# Patient Record
Sex: Male | Born: 1935 | Race: White | Hispanic: No | Marital: Married | Smoking: Never smoker
Health system: Southern US, Community
[De-identification: ages and names within clinical notes are randomized; demographics above are authoritative.]

## PROBLEM LIST (undated history)

## (undated) DIAGNOSIS — Z8719 Personal history of other diseases of the digestive system: Secondary | ICD-10-CM

## (undated) DIAGNOSIS — Z86006 Personal history of melanoma in-situ: Secondary | ICD-10-CM

## (undated) DIAGNOSIS — I10 Essential (primary) hypertension: Secondary | ICD-10-CM

## (undated) DIAGNOSIS — Z8711 Personal history of peptic ulcer disease: Secondary | ICD-10-CM

## (undated) DIAGNOSIS — R351 Nocturia: Secondary | ICD-10-CM

## (undated) DIAGNOSIS — N400 Enlarged prostate without lower urinary tract symptoms: Secondary | ICD-10-CM

## (undated) DIAGNOSIS — Z87442 Personal history of urinary calculi: Secondary | ICD-10-CM

## (undated) DIAGNOSIS — I2510 Atherosclerotic heart disease of native coronary artery without angina pectoris: Secondary | ICD-10-CM

## (undated) DIAGNOSIS — N281 Cyst of kidney, acquired: Secondary | ICD-10-CM

## (undated) DIAGNOSIS — M1990 Unspecified osteoarthritis, unspecified site: Secondary | ICD-10-CM

## (undated) DIAGNOSIS — Z974 Presence of external hearing-aid: Secondary | ICD-10-CM

## (undated) DIAGNOSIS — K219 Gastro-esophageal reflux disease without esophagitis: Secondary | ICD-10-CM

## (undated) DIAGNOSIS — I252 Old myocardial infarction: Secondary | ICD-10-CM

## (undated) DIAGNOSIS — E119 Type 2 diabetes mellitus without complications: Secondary | ICD-10-CM

## (undated) DIAGNOSIS — R319 Hematuria, unspecified: Secondary | ICD-10-CM

## (undated) DIAGNOSIS — Z973 Presence of spectacles and contact lenses: Secondary | ICD-10-CM

## (undated) DIAGNOSIS — Z955 Presence of coronary angioplasty implant and graft: Secondary | ICD-10-CM

## (undated) DIAGNOSIS — C679 Malignant neoplasm of bladder, unspecified: Secondary | ICD-10-CM

## (undated) DIAGNOSIS — E785 Hyperlipidemia, unspecified: Secondary | ICD-10-CM

## (undated) HISTORY — DX: Gastro-esophageal reflux disease without esophagitis: K21.9

## (undated) HISTORY — PX: OTHER SURGICAL HISTORY: SHX169

## (undated) HISTORY — DX: Malignant neoplasm of bladder, unspecified: C67.9

## (undated) HISTORY — DX: Unspecified osteoarthritis, unspecified site: M19.90

## (undated) HISTORY — DX: Essential (primary) hypertension: I10

## (undated) HISTORY — PX: CORONARY ANGIOPLASTY WITH STENT PLACEMENT: SHX49

---

## 1969-06-27 HISTORY — PX: CHOLECYSTECTOMY OPEN: SUR202

## 1993-06-27 HISTORY — PX: INGUINAL HERNIA REPAIR: SUR1180

## 2006-06-27 HISTORY — PX: TOTAL HIP ARTHROPLASTY: SHX124

## 2006-09-26 HISTORY — PX: TRANSURETHRAL RESECTION OF BLADDER TUMOR: SHX2575

## 2010-06-27 HISTORY — PX: ABDOMINAL HERNIA REPAIR: SHX539

## 2016-08-18 DIAGNOSIS — I2510 Atherosclerotic heart disease of native coronary artery without angina pectoris: Secondary | ICD-10-CM | POA: Insufficient documentation

## 2016-08-18 DIAGNOSIS — N401 Enlarged prostate with lower urinary tract symptoms: Secondary | ICD-10-CM | POA: Insufficient documentation

## 2016-08-18 DIAGNOSIS — I10 Essential (primary) hypertension: Secondary | ICD-10-CM

## 2016-08-18 DIAGNOSIS — K219 Gastro-esophageal reflux disease without esophagitis: Secondary | ICD-10-CM | POA: Insufficient documentation

## 2016-08-18 DIAGNOSIS — R3912 Poor urinary stream: Secondary | ICD-10-CM

## 2016-08-18 HISTORY — DX: Essential (primary) hypertension: I10

## 2016-08-23 NOTE — Progress Notes (Signed)
Hematology/Oncology Consult note [facility] [phone] [phone]  No care team member to display   Name of the patient: Tyler Pena  [id number]  [birth year]    Reason for referral- new diagnosis of bladder cancer   Referring physician- Dr. 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 He also had a cystoscopy on 08/01/2016 which showed a bladder mass but no biopsy was done. His other medical problems include hypertension coronary artery disease as well as BPH. He has had prior abdominal surgeries for hernia repair as well as calyceal lysis  CT abdomen on 07/30/2016 showed: Liver and spleen are normal. There has been prior cholecystectomy. Bile duct, pancreas and adrenal glands are normal. There are no renal calculi or hydronephrosis. There are bilateral renal cysts without any evidence of solid renal masses. Renal pelvis and left ureters are normal. There is a 2 mm distal right ureteral calculus adjacent to the trigone there is a right posterior lateral bladder wall mass enhancing approximately 40 Hounsfield units suspicious for primary bladder neoplasm. Prior ventral herniorrhaphy with able concentric adenopathy. This CT was compared to April 2017. In April 2017 patient did not have any abdominal or pelvic lymphadenopathy. Cystoscopy on 08/01/2016 by Dr. Velna Hatchet showed a papillary appearing bladder tumor on the right lower. The right ureteral orifice was not identified. There were no additional bladder tumors identified.   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 He had a right double J stent placement because of the proximity of the mass to the right orifice. Patient has also had  prostate biopsies in 1999 which were negative for malignancy.  Patient has been seen by Dr.Ottelin from urology and underwent a repeat cystoscopy recently which again revealed bladder tumor and the right side of the bladder wall suspicious for transitional cell carcinoma. Patient is scheduled to undergo TURBT on 09/02/2016  ECOG PS- 1  Pain scale- 0   Review of systems- Review of Systems  Constitutional: Negative for chills, fever, malaise/fatigue and weight loss.  HENT: Negative for congestion, ear discharge and nosebleeds.   Eyes: Negative for blurred vision.  Respiratory: Negative for cough, hemoptysis, sputum production, shortness of breath and wheezing.   Cardiovascular: Negative for chest pain, palpitations, orthopnea and claudication.  Gastrointestinal: Negative for abdominal pain, blood in stool, constipation, diarrhea, heartburn, melena, nausea and vomiting.  Genitourinary: Negative for dysuria, flank pain, frequency, hematuria and urgency.  Musculoskeletal: Negative for back pain, joint pain and myalgias.  Skin: Negative for rash.  Neurological: Negative for dizziness, tingling, focal weakness, seizures, weakness and headaches.  Endo/Heme/Allergies: Does not bruise/bleed easily.  Psychiatric/Behavioral: Negative for depression and suicidal ideas. The patient does not have insomnia.     No Known Allergies  Patient Active Problem List   Diagnosis Date Noted  . Benign prostatic hyperplasia with weak urinary stream 08/18/2016  . Coronary artery disease involving native coronary artery of native heart without angina pectoris 08/18/2016  . Essential hypertension 08/18/2016  . GERD without esophagitis 08/18/2016     Past Medical History:  Diagnosis Date  . Arthritis   . Bladder cancer ([place])   . Diabetes mellitus without complication ([place])   . GERD (gastroesophageal reflux disease)   . 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 Skin cancer      Past Surgical  History:  Procedure Laterality Date  . abdominal adhesions lysis    . ABDOMINAL HERNIA REPAIR    . bladder tumor removed    . CHOLECYSTECTOMY    . CORONARY ANGIOPLASTY WITH STENT PLACEMENT    . HERNIA REPAIR    . REPLACEMENT TOTAL HIP W/  RESURFACING IMPLANTS Left     Social History   Social History  . Marital status: Married    Spouse name: N/A  . Number of children: N/A  . Years of education: N/A   Occupational History  . Not on file.   Social History Main Topics  . Smoking status: Not on file  . Smokeless tobacco: Not on file  . Alcohol use Not on file  . Drug use: Unknown  . Sexual activity: Not on file   Other Topics Concern  . Not on file   Social History Narrative  . No narrative on file     No family history on file.  No current outpatient prescriptions on file.   Physical exam:  Vitals:   08/25/16 1528  BP: (!) 153/84  Pulse: 63  Resp: (!) 21  Temp: (!) 96.8 F (36 C)  TempSrc: Oral  Weight: 205 lb 7.5 oz (93.2 kg)  Height: 5' 10.67" (1.795 m)   Physical Exam  Constitutional: He is oriented to person, place, and time and well-developed, well-nourished, and in no distress.  HENT:  Head: Normocephalic and atraumatic.  Eyes: EOM are normal. Pupils are equal, round, and reactive to light.  Neck: Normal range of motion.  Cardiovascular: Normal rate and regular rhythm.   Murmur (Soft systolic murmur positive) heard. Pulmonary/Chest: Effort normal and breath sounds normal.  Abdominal: Soft. Bowel sounds are normal.  Midline scar of prior abdominal surgery seen. Anterior abdominal wall is weakened from prior hernia repair and bowel loops can be palpated  Neurological: He is alert and oriented to person, place, and time.  Skin: Skin is warm and dry.      Assessment and plan- Patient is a 83 y.o. male with a history of leiomyoma of the bladder in 2008 status post resection now presenting with a recurrent bladder mass pending further evaluation  I  discussed the results of patient's recent CT scan which shows evidence of bladder mass as well as stable mesenteric adenopathy. At this time I will await for patient to undergo definitive TURBT on 09/02/2016 and see him after the pathology is back. I explained to him that if the pathology shows no evidence of muscle invasion then this would be superficial bladder cancer which is typically treated with TURBT plus minus intravesical mitomycin or BCG by urology. If there is evidence of muscle invasive disease,  the standard of care in these circumstances is neoadjuvant chemotherapy followed by a radical cystectomy. However patient is 83 years of age but does not have significant comorbidities and is himself not keen to undergo such a big surgery even if that were to be an option. If definitive surgery is not an option for muscle invasive disease then I explained to him that chemoradiation would be the treatment option in that case.    Total face to face encounter time for this patient visit was 30 min. >50% of the time was  spent in counseling and coordination of care.     Thank you for this kind referral and the opportunity to participate  in the care of this patient   Visit Diagnosis 1. Bladder mass     Dr. Randa Evens, MD, MPH [facility] at [facility] Pager- [phone] 08/23/2016 8:30 AM

## 2016-08-25 ENCOUNTER — Inpatient Hospital Stay: Payer: Medicare Other | Attending: Oncology | Admitting: Oncology

## 2016-08-25 ENCOUNTER — Encounter: Payer: Self-pay | Admitting: Oncology

## 2016-08-25 ENCOUNTER — Other Ambulatory Visit: Payer: Self-pay | Admitting: Urology

## 2016-08-25 ENCOUNTER — Encounter (INDEPENDENT_AMBULATORY_CARE_PROVIDER_SITE_OTHER): Payer: Self-pay

## 2016-08-25 VITALS — BP 153/84 | HR 63 | Temp 96.8°F | Resp 21 | Ht 70.67 in | Wt 205.5 lb

## 2016-08-25 DIAGNOSIS — N3289 Other specified disorders of bladder: Secondary | ICD-10-CM | POA: Diagnosis not present

## 2016-08-25 DIAGNOSIS — I2510 Atherosclerotic heart disease of native coronary artery without angina pectoris: Secondary | ICD-10-CM

## 2016-08-25 DIAGNOSIS — R3912 Poor urinary stream: Secondary | ICD-10-CM | POA: Diagnosis not present

## 2016-08-25 DIAGNOSIS — E119 Type 2 diabetes mellitus without complications: Secondary | ICD-10-CM

## 2016-08-25 DIAGNOSIS — R599 Enlarged lymph nodes, unspecified: Secondary | ICD-10-CM

## 2016-08-25 DIAGNOSIS — I10 Essential (primary) hypertension: Secondary | ICD-10-CM | POA: Diagnosis not present

## 2016-08-25 DIAGNOSIS — Z9049 Acquired absence of other specified parts of digestive tract: Secondary | ICD-10-CM | POA: Diagnosis not present

## 2016-08-25 DIAGNOSIS — Z85820 Personal history of malignant melanoma of skin: Secondary | ICD-10-CM

## 2016-08-25 DIAGNOSIS — K219 Gastro-esophageal reflux disease without esophagitis: Secondary | ICD-10-CM | POA: Diagnosis not present

## 2016-08-25 DIAGNOSIS — N401 Enlarged prostate with lower urinary tract symptoms: Secondary | ICD-10-CM

## 2016-08-25 DIAGNOSIS — C679 Malignant neoplasm of bladder, unspecified: Secondary | ICD-10-CM | POA: Insufficient documentation

## 2016-08-25 DIAGNOSIS — N281 Cyst of kidney, acquired: Secondary | ICD-10-CM | POA: Diagnosis not present

## 2016-08-25 NOTE — Progress Notes (Signed)
Patient without complaints at this time.  Patient wants to get the cancer out and get the chemo as necessary and go about life.  Wants to live until at least 89.

## 2016-08-26 ENCOUNTER — Encounter: Payer: Self-pay | Admitting: Oncology

## 2016-08-30 ENCOUNTER — Encounter (HOSPITAL_BASED_OUTPATIENT_CLINIC_OR_DEPARTMENT_OTHER): Payer: Self-pay | Admitting: *Deleted

## 2016-08-30 NOTE — Progress Notes (Signed)
SPOKE W/ PT'S DAUGHTER.  PT [place] PHONE.  NPO AFTER MN.  ARRIVE AT 0930.  NEEDS ISTAT 8 AND EKG.

## 2016-09-02 ENCOUNTER — Ambulatory Visit (HOSPITAL_BASED_OUTPATIENT_CLINIC_OR_DEPARTMENT_OTHER): Payer: Medicare Other | Admitting: Anesthesiology

## 2016-09-02 ENCOUNTER — Ambulatory Visit (HOSPITAL_BASED_OUTPATIENT_CLINIC_OR_DEPARTMENT_OTHER)
Admission: RE | Admit: 2016-09-02 | Discharge: 2016-09-02 | Disposition: A | Discharge status: Home / Self Care | Payer: Medicare Other | Source: Ambulatory Visit | Attending: Urology | Admitting: Urology

## 2016-09-02 ENCOUNTER — Encounter (HOSPITAL_BASED_OUTPATIENT_CLINIC_OR_DEPARTMENT_OTHER)
Admission: RE | Disposition: A | Discharge status: Home / Self Care | Payer: Self-pay | Source: Ambulatory Visit | Attending: Urology

## 2016-09-02 ENCOUNTER — Encounter (HOSPITAL_BASED_OUTPATIENT_CLINIC_OR_DEPARTMENT_OTHER): Payer: Self-pay | Admitting: Anesthesiology

## 2016-09-02 ENCOUNTER — Other Ambulatory Visit: Payer: Self-pay

## 2016-09-02 DIAGNOSIS — K219 Gastro-esophageal reflux disease without esophagitis: Secondary | ICD-10-CM | POA: Insufficient documentation

## 2016-09-02 DIAGNOSIS — I2510 Atherosclerotic heart disease of native coronary artery without angina pectoris: Secondary | ICD-10-CM | POA: Insufficient documentation

## 2016-09-02 DIAGNOSIS — E119 Type 2 diabetes mellitus without complications: Secondary | ICD-10-CM | POA: Insufficient documentation

## 2016-09-02 DIAGNOSIS — I10 Essential (primary) hypertension: Secondary | ICD-10-CM | POA: Diagnosis not present

## 2016-09-02 DIAGNOSIS — Z7982 Long term (current) use of aspirin: Secondary | ICD-10-CM | POA: Diagnosis not present

## 2016-09-02 DIAGNOSIS — M1990 Unspecified osteoarthritis, unspecified site: Secondary | ICD-10-CM | POA: Diagnosis not present

## 2016-09-02 DIAGNOSIS — Z8249 Family history of ischemic heart disease and other diseases of the circulatory system: Secondary | ICD-10-CM | POA: Insufficient documentation

## 2016-09-02 DIAGNOSIS — C679 Malignant neoplasm of bladder, unspecified: Secondary | ICD-10-CM | POA: Diagnosis not present

## 2016-09-02 DIAGNOSIS — D494 Neoplasm of unspecified behavior of bladder: Secondary | ICD-10-CM

## 2016-09-02 HISTORY — DX: Presence of spectacles and contact lenses: Z97.3

## 2016-09-02 HISTORY — DX: Personal history of peptic ulcer disease: Z87.11

## 2016-09-02 HISTORY — DX: Cyst of kidney, acquired: N28.1

## 2016-09-02 HISTORY — DX: Old myocardial infarction: I25.2

## 2016-09-02 HISTORY — DX: Personal history of urinary calculi: Z87.442

## 2016-09-02 HISTORY — DX: Personal history of melanoma in-situ: Z86.006

## 2016-09-02 HISTORY — DX: Hyperlipidemia, unspecified: E78.5

## 2016-09-02 HISTORY — DX: Type 2 diabetes mellitus without complications: E11.9

## 2016-09-02 HISTORY — DX: Presence of coronary angioplasty implant and graft: Z95.5

## 2016-09-02 HISTORY — PX: TRANSURETHRAL RESECTION OF BLADDER TUMOR WITH MITOMYCIN-C: SHX6459

## 2016-09-02 HISTORY — DX: Nocturia: R35.1

## 2016-09-02 HISTORY — DX: Presence of external hearing-aid: Z97.4

## 2016-09-02 HISTORY — DX: Benign prostatic hyperplasia without lower urinary tract symptoms: N40.0

## 2016-09-02 HISTORY — DX: Hematuria, unspecified: R31.9

## 2016-09-02 HISTORY — DX: Atherosclerotic heart disease of native coronary artery without angina pectoris: I25.10

## 2016-09-02 HISTORY — DX: Personal history of other diseases of the digestive system: Z87.19

## 2016-09-02 LAB — POCT I-STAT, CHEM 8
BUN: 17 mg/dL (ref 6–20)
Calcium, Ion: 1.12 mmol/L — ABNORMAL LOW (ref 1.15–1.40)
Chloride: 105 mmol/L (ref 101–111)
Creatinine, Ser: 0.8 mg/dL (ref 0.61–1.24)
Glucose, Bld: 143 mg/dL — ABNORMAL HIGH (ref 65–99)
HCT: 38 % — ABNORMAL LOW (ref 39.0–52.0)
Hemoglobin: 12.9 g/dL — ABNORMAL LOW (ref 13.0–17.0)
Potassium: 3.8 mmol/L (ref 3.5–5.1)
Sodium: 144 mmol/L (ref 135–145)
TCO2: 24 mmol/L (ref 0–100)

## 2016-09-02 SURGERY — TRANSURETHRAL RESECTION OF BLADDER TUMOR WITH MITOMYCIN-C
Anesthesia: General

## 2016-09-02 MED ORDER — OXYCODONE HCL 5 MG PO TABS
5.0000 mg | ORAL_TABLET | Freq: Once | ORAL | Status: DC | PRN
  Filled 2016-09-02: qty 1

## 2016-09-02 MED ORDER — FENTANYL CITRATE (PF) 100 MCG/2ML IJ SOLN
INTRAMUSCULAR | Status: AC
  Filled 2016-09-02: qty 2

## 2016-09-02 MED ORDER — MITOMYCIN CHEMO FOR BLADDER INSTILLATION 40 MG
40.0000 mg | Freq: Once | INTRAVENOUS | Status: AC
  Administered 2016-09-02: 40 mg via INTRAVESICAL
  Filled 2016-09-02 (×2): qty 40

## 2016-09-02 MED ORDER — PHENYLEPHRINE 40 MCG/ML (10ML) SYRINGE FOR IV PUSH (FOR BLOOD PRESSURE SUPPORT)
PREFILLED_SYRINGE | INTRAVENOUS | Status: AC
  Filled 2016-09-02: qty 10

## 2016-09-02 MED ORDER — OXYCODONE HCL 5 MG/5ML PO SOLN
5.0000 mg | Freq: Once | ORAL | Status: DC | PRN
  Filled 2016-09-02: qty 5

## 2016-09-02 MED ORDER — EPHEDRINE SULFATE-NACL 50-0.9 MG/10ML-% IV SOSY
PREFILLED_SYRINGE | INTRAVENOUS | Status: DC | PRN
  Administered 2016-09-02 (×2): 15 mg via INTRAVENOUS

## 2016-09-02 MED ORDER — ONDANSETRON HCL 4 MG/2ML IJ SOLN
4.0000 mg | Freq: Four times a day (QID) | INTRAMUSCULAR | Status: DC | PRN
  Filled 2016-09-02: qty 2

## 2016-09-02 MED ORDER — PROPOFOL 10 MG/ML IV BOLUS
INTRAVENOUS | Status: AC
  Filled 2016-09-02: qty 40

## 2016-09-02 MED ORDER — ONDANSETRON HCL 4 MG/2ML IJ SOLN
INTRAMUSCULAR | Status: DC | PRN
  Administered 2016-09-02: 4 mg via INTRAVENOUS

## 2016-09-02 MED ORDER — LIDOCAINE 2% (20 MG/ML) 5 ML SYRINGE
INTRAMUSCULAR | Status: DC | PRN
  Administered 2016-09-02: 80 mg via INTRAVENOUS

## 2016-09-02 MED ORDER — FENTANYL CITRATE (PF) 100 MCG/2ML IJ SOLN
INTRAMUSCULAR | Status: DC | PRN
  Administered 2016-09-02 (×3): 25 ug via INTRAVENOUS

## 2016-09-02 MED ORDER — HYDROCODONE-ACETAMINOPHEN 10-325 MG PO TABS: 1.0000 | ORAL_TABLET | ORAL | 0 refills | Status: DC | PRN

## 2016-09-02 MED ORDER — DEXAMETHASONE SODIUM PHOSPHATE 10 MG/ML IJ SOLN
INTRAMUSCULAR | Status: AC
  Filled 2016-09-02: qty 1

## 2016-09-02 MED ORDER — ONDANSETRON HCL 4 MG/2ML IJ SOLN
INTRAMUSCULAR | Status: AC
  Filled 2016-09-02: qty 2

## 2016-09-02 MED ORDER — PROPOFOL 10 MG/ML IV BOLUS
INTRAVENOUS | Status: DC | PRN
  Administered 2016-09-02: 30 mg via INTRAVENOUS
  Administered 2016-09-02: 150 mg via INTRAVENOUS

## 2016-09-02 MED ORDER — GLYCOPYRROLATE 0.2 MG/ML IJ SOLN
INTRAMUSCULAR | Status: DC | PRN
  Administered 2016-09-02 (×2): 0.2 mg via INTRAVENOUS

## 2016-09-02 MED ORDER — FLUORESCEIN SODIUM 10 % IV SOLN
INTRAVENOUS | Status: DC | PRN
  Administered 2016-09-02: 200 mg via INTRAVENOUS

## 2016-09-02 MED ORDER — FENTANYL CITRATE (PF) 100 MCG/2ML IJ SOLN
25.0000 ug | INTRAMUSCULAR | Status: DC | PRN
  Administered 2016-09-02: 50 ug via INTRAVENOUS
  Filled 2016-09-02: qty 1

## 2016-09-02 MED ORDER — CIPROFLOXACIN IN D5W 400 MG/200ML IV SOLN
400.0000 mg | INTRAVENOUS | Status: AC
  Administered 2016-09-02: 400 mg via INTRAVENOUS
  Filled 2016-09-02: qty 200

## 2016-09-02 MED ORDER — CIPROFLOXACIN IN D5W 400 MG/200ML IV SOLN
INTRAVENOUS | Status: AC
  Filled 2016-09-02: qty 200

## 2016-09-02 MED ORDER — LIDOCAINE 2% (20 MG/ML) 5 ML SYRINGE
INTRAMUSCULAR | Status: AC
  Filled 2016-09-02: qty 5

## 2016-09-02 MED ORDER — PHENAZOPYRIDINE HCL 200 MG PO TABS: 200.0000 mg | ORAL_TABLET | Freq: Three times a day (TID) | ORAL | 0 refills | Status: DC | PRN

## 2016-09-02 MED ORDER — LACTATED RINGERS IV SOLN
INTRAVENOUS | Status: DC
  Administered 2016-09-02 (×3): via INTRAVENOUS
  Filled 2016-09-02: qty 1000

## 2016-09-02 MED ORDER — SODIUM CHLORIDE 0.9 % IR SOLN
Status: DC | PRN
  Administered 2016-09-02 (×4): 3000 mL

## 2016-09-02 MED ORDER — EPHEDRINE 5 MG/ML INJ
INTRAVENOUS | Status: AC
  Filled 2016-09-02: qty 10

## 2016-09-02 MED FILL — PHENAZOPYRIDINE 200 MG TAB: 200 | 6 days supply | Qty: 16 | Fill #0

## 2016-09-02 MED FILL — HYDROCODON-APAP 10-325: 10-325 | 3 days supply | Qty: 20 | Fill #0

## 2016-09-02 SURGICAL SUPPLY — 33 items
BAG DRAIN URO-CYSTO SKYTR STRL (DRAIN) ×2 IMPLANT
BAG URINE DRAINAGE (UROLOGICAL SUPPLIES) ×2 IMPLANT
BAG URINE LEG 19OZ MD ST LTX (BAG) IMPLANT
CATH FOLEY 2WAY SLVR  5CC 20FR (CATHETERS) ×1
CATH FOLEY 2WAY SLVR  5CC 22FR (CATHETERS)
CATH FOLEY 2WAY SLVR  5CC 24FR (CATHETERS) ×1
CATH FOLEY 2WAY SLVR 5CC 20FR (CATHETERS) ×1 IMPLANT
CATH FOLEY 2WAY SLVR 5CC 22FR (CATHETERS) IMPLANT
CATH FOLEY 2WAY SLVR 5CC 24FR (CATHETERS) ×1 IMPLANT
CATH FOLEY 3WAY 20FR (CATHETERS) IMPLANT
CLOTH BEACON ORANGE TIMEOUT ST (SAFETY) ×2 IMPLANT
ELECT BIVAP BIPO 22/24 DONUT (ELECTROSURGICAL) ×2
ELECT LOOP MED HF 24F 12D (CUTTING LOOP) IMPLANT
ELECT REM PT RETURN 9FT ADLT (ELECTROSURGICAL) ×2
ELECTRD BIVAP BIPO 22/24 DONUT (ELECTROSURGICAL) ×1 IMPLANT
ELECTRODE REM PT RTRN 9FT ADLT (ELECTROSURGICAL) ×1 IMPLANT
EVACUATOR MICROVAS BLADDER (UROLOGICAL SUPPLIES) ×2 IMPLANT
GLOVE BIO SURGEON STRL SZ7 (GLOVE) ×2 IMPLANT
GLOVE BIO SURGEON STRL SZ8 (GLOVE) ×2 IMPLANT
GLOVE BIOGEL PI IND STRL 7.0 (GLOVE) ×1 IMPLANT
GLOVE BIOGEL PI INDICATOR 7.0 (GLOVE) ×1
GOWN STRL REUS W/ TWL LRG LVL3 (GOWN DISPOSABLE) ×1 IMPLANT
GOWN STRL REUS W/ TWL XL LVL3 (GOWN DISPOSABLE) ×1 IMPLANT
GOWN STRL REUS W/TWL LRG LVL3 (GOWN DISPOSABLE) ×1
GOWN STRL REUS W/TWL XL LVL3 (GOWN DISPOSABLE) ×1
HOLDER FOLEY CATH W/STRAP (MISCELLANEOUS) ×2 IMPLANT
IV NS IRRIG 3000ML ARTHROMATIC (IV SOLUTION) ×6 IMPLANT
KIT RM TURNOVER CYSTO AR (KITS) ×2 IMPLANT
MANIFOLD NEPTUNE II (INSTRUMENTS) IMPLANT
PACK CYSTO (CUSTOM PROCEDURE TRAY) ×2 IMPLANT
PLUG CATH AND CAP STER (CATHETERS) IMPLANT
TUBE CONNECTING 12X1/4 (SUCTIONS) IMPLANT
WATER STERILE IRR 3000ML UROMA (IV SOLUTION) ×8 IMPLANT

## 2016-09-02 NOTE — Anesthesia Postprocedure Evaluation (Signed)
Anesthesia Post Note  Patient: Tyler Pena  Procedure(s) Performed: Procedure(s) (LRB): TRANSURETHRAL RESECTION OF BLADDER TUMOR WITH MITOMYCIN-C (N/A)  Patient location during evaluation: PACU Anesthesia Type: General Level of consciousness: awake and alert and patient cooperative Pain management: pain level controlled Vital Signs Assessment: post-procedure vital signs reviewed and stable Respiratory status: spontaneous breathing and respiratory function stable Cardiovascular status: stable Anesthetic complications: no       Last Vitals:  Vitals:   09/02/16 1230 09/02/16 1245  BP: 128/60 (!) 141/64  Pulse: 91 85  Resp: 18 16  Temp:      Last Pain:  Vitals:   09/02/16 1245  TempSrc:   PainSc: [place]

## 2016-09-02 NOTE — Discharge Instructions (Signed)
Indwelling Urinary Catheter Care, Adult Taking good care of your catheter will keep it working properly and help prevent problems from developing. How to wear your catheter Attach your catheter to your leg with adhesive tape or a leg strap. Make sure there is no tension on the catheter. If you use adhesive tape, first remove any sticky residue from the previous tape you used. How to wear a drainage bag  You should have received a large overnight drainage bag and a smaller leg bag that fits underneath clothing. You may wear the overnight bag at any time, but you should never wear the smaller leg bag at night.  Always keep the overnight drainage bag below the level of your bladder, but keep it off the floor. When you sleep, hang the bag inside a wastebasket that is covered by a clean plastic bag.  Always wear the leg bag below your knee. Keep the leg bag secure with a leg strap or adhesive tape. How to care for your skin  Clean the skin around the catheter at least once every day.  Shower every day. Do not take baths.  Apply creams, lotions, or ointments to your genital area only as told by your health care provider.  Do not use powders, sprays, or lotions on your genital area. How to clean your catheter and your skin 1. Wash your hands with soap and water. 2. Wet a washcloth in warm water and mild soap. 3. Use the washcloth to clean the skin where the catheter enters your body. Clean downward, wiping away from the catheter in small circles. Do not wipe toward the catheter. This can push bacteria into the urethra and cause infection. 4. Pat the area dry with a clean towel. Make sure to remove all soap. How to care for your drainage bags Empty your drainage bag when it is ?- full, or at least 2-3 times a day. Replace your drainage bag once a month or sooner if it starts to smell bad or look dirty. Do not clean your drainage bag unless told by your health care provider. Emptying a drainage  bag   Supplies Needed  Rubbing alcohol.  Gauze pad or cotton ball.  Adhesive tape or a leg strap. Steps 1. Wash your hands with soap and water. 2. Detach the drainage bag from your leg. 3. Hold the drainage bag over the toilet or a clean container. Keep the drainage bag below your hips and bladder. This stops urine from going back into the tubing and into your bladder. 4. Open the pour spout at the bottom of the bag. 5. Empty the urine into the toilet or container. Do not let the pour spout touch any surface. This helps keep bacteria out of the bag and helps prevent infection. 6. Apply rubbing alcohol to a gauze pad or cotton ball. 7. Use the gauze pad or cotton ball to clean the pour spout. 8. Close the pour spout. 9. Attach the bag to your leg with adhesive tape or a leg strap. 10. Wash your hands. Changing a drainage bag  Supplies Needed  Alcohol wipes.  A clean drainage bag.  Adhesive tape or a leg strap. Steps 1. Wash your hands with soap and water. 2. Detach the dirty drainage bag from your leg. 3. Pinch the rubber catheter with your fingers so that urine does not spill out. 4. Disconnect the catheter tube from the drainage tube at the connection valve. Do not let the tubes touch any surface. 5. Clean  the end of the catheter tube with an alcohol wipe. Use a different alcohol wipe to clean the end of the drainage tube. 6. Connect the catheter tube to the drainage tube of the clean drainage bag. 7. Attach the new bag to your leg with adhesive tape or a leg strap. Avoid attaching the new bag too tightly. 8. Wash your hands. How to prevent infection and other problems  Never pull on your catheter or try to remove it. Pulling can damage your internal tissues.  Always wash your hands before and after handling your catheter.  If a leg strap gets wet, replace it with a dry one.  Drink enough fluids to keep your urine clear or pale yellow, or as told by your health care  provider.  Do not let the drainage bag or tubing touch the floor.  Wear cotton underwear. Cotton absorbs moisture and keeps your skin dry.  If you are male, wipe from front to back after each bowel movement.  Check the catheter often to make sure that it works properly and the tubing is not twisted or curled. Contact a health care provider if:  Your urine is cloudy.  Your urine smells unusually bad.  Your urine is not draining into the bag.  Your catheter gets clogged.  Your catheter starts to leak.  Your bladder feels full. Get help right away if:  You have redness, swelling, or pain where the catheter enters your body.  You have fluid, pus, or a bad smell coming from the area where the catheter enters your body.  The area where the catheter enters your body feels warm to the touch.  You have a fever.  You have pain in your abdomen, legs, lower back, or bladder.  You see blood fill the catheter.  Your urine is pink or red.  You have nausea, vomiting, or chills.  Your catheter gets pulled out. This information is not intended to replace advice given to you by your health care provider. Make sure you discuss any questions you have with your health care provider. Document Released: 06/13/2005 Document Revised: 05/11/2016 Document Reviewed: 11/26/2013 Elsevier Interactive Patient Education  2017 [place] Anesthesia Home Care Instructions  Activity: Get plenty of rest for the remainder of the day. A responsible adult should stay with you for 24 hours following the procedure.  For the next 24 hours, DO NOT: -Drive a car -Paediatric nurse -Drink alcoholic beverages -Take any medication unless instructed by your physician -Make any legal decisions or sign important papers.  Meals: Start with liquid foods such as gelatin or soup. Progress to regular foods as tolerated. Avoid greasy, spicy, heavy foods. If nausea and/or vomiting occur, drink only clear  liquids until the nausea and/or vomiting subsides. Call your physician if vomiting continues.  Special Instructions/Symptoms: Your throat may feel dry or sore from the anesthesia or the breathing tube placed in your throat during surgery. If this causes discomfort, gargle with warm salt water. The discomfort should disappear within 24 hours.  If you had a scopolamine patch placed behind your ear for the management of post- operative nausea and/or vomiting:  1. The medication in the patch is effective for 72 hours, after which it should be removed.  Wrap patch in a tissue and discard in the trash. Wash hands thoroughly with soap and water. 2. You may remove the patch earlier than 72 hours if you experience unpleasant side effects which may include dry mouth, dizziness or visual disturbances.  3. Avoid touching the patch. Wash your hands with soap and water after contact with the patch.   Transurethral Resection of Bladder Tumor (TURBT)   Definition:  Transurethral Resection of the Bladder Tumor is a surgical procedure used to diagnose and remove tumors within the bladder. TURBT is the most common treatment for early stage bladder cancer.  General instructions:     Your recent bladder surgery requires very little post hospital care but some definite precautions.  Despite the fact that no skin incisions were used, the area around the bladder incisions are raw and covered with scabs to promote healing and prevent bleeding. Certain precautions are needed to insure that the scabs are not disturbed over the next 2-4 weeks while the healing proceeds.  Because the raw surface inside your bladder and the irritating effects of urine you may expect frequency of urination and/or urgency (a stronger desire to urinate) and perhaps even getting up at night more often. This will usually resolve or improve slowly over the healing period. You may see some blood in your urine over the first 6 weeks. Do not be  alarmed, even if the urine was clear for a while. Get off your feet and drink lots of fluids until clearing occurs. If you start to pass clots or don't improve call us.  Catheter: (If you are discharged with a catheter.)  1. Keep your catheter secured to your leg at all times with tape or the supplied strap. 2. You may experience leakage of urine around your catheter- as long as the  catheter continues to drain, this is normal.  If your catheter stops draining  go to the ER. 3. You may also have blood in your urine, even after it has been clear for  several days; you may even pass some small blood clots or other material.  This  is normal as well.  If this happens, sit down and drink plenty of water to help  make urine to flush out your bladder.  If the blood in your urine becomes worse  after doing this, contact our office or return to the ER. 4. You may use the leg bag (small bag) during the day, but use the large bag at  night.  Diet:  You may return to your normal diet immediately. Because of the raw surface of your bladder, alcohol, spicy foods, foods high in acid and drinks with caffeine may cause irritation or frequency and should be used in moderation. To keep your urine flowing freely and avoid constipation, drink plenty of fluids during the day (8-10 glasses). Tip: Avoid cranberry juice because it is very acidic.  Activity:  Your physical activity doesn't need to be restricted. However, if you are very active, you may see some blood in the urine. We suggest that you reduce your activity under the circumstances until the bleeding has stopped.  Bowels:  It is important to keep your bowels regular during the postoperative period. Straining with bowel movements can cause bleeding. A bowel movement every other day is reasonable. Use a mild laxative if needed, such as milk of magnesia 2-3 tablespoons, or 2 Dulcolax tablets. Call if you continue to have problems. If you had been taking  narcotics for pain, before, during or after your surgery, you may be constipated. Take a laxative if necessary.    Medication:  You should resume your pre-surgery medications unless told not to. In addition you may be given an antibiotic to prevent or treat infection. Antibiotics  are not always necessary. All medication should be taken as prescribed until the bottles are finished unless you are having an unusual reaction to one of the drugs.

## 2016-09-02 NOTE — Op Note (Signed)
PATIENT:  Tyler Pena  PRE-OPERATIVE DIAGNOSIS: Bladder tumor  POST-OPERATIVE DIAGNOSIS: Same  PROCEDURE:  Procedure(s): 1. TRANSURETHRAL RESECTION OF BLADDER TUMOR (TURBT) (3 cm.) 2. Instillation of intravesical chemotherapy (mitomycin-C)  SURGEON:  Surgeon(s): Claybon Jabs  ANESTHESIA:   General  EBL:  Minimal  DRAINS: Urethral catheter (20 Fr. Foley)   SPECIMEN:  Bladder tumor  DISPOSITION OF SPECIMEN:  PATHOLOGY  Indication:  Mr. Abarca is a 83 year old male who was recently found to have what was described as a 3 mm bladder tumor after evaluation for hematuria was undertaken. When I saw him in my office and performed cystoscopy I noted a much larger tumor involving the floor of the bladder, right hemitrigone, right wall of the bladder, posterior wall a bladder and advancing toward the bladder neck but not involving the bladder neck. We discussed the need for resection and use of postoperative mitomycin-C and he has elected to proceed.  Description of operation: The patient was taken to the operating room and administered general anesthesia. They were then placed on the table and moved to the dorsal lithotomy position after which the genitalia was sterilely prepped and draped. An official timeout was then performed.  The 46 French resectoscope with the 30 lens and visual obturator were then passed into the bladder under direct visualization. Urethra appeared normal. The visual obturator was then removed and the Gyrus resectoscope element with 30  lens was then inserted and the bladder was fully and systematically inspected. There was 3-4+ trabeculation of the bladder. The left ureteral orifice and noted to be of normal configuration and position. The right ureteral orifice was completely encompassed with bladder tumor. The bladder tumor extended to the locations as noted above. Having involve the right ureteral Fluorescein was given intravenously to aid in identification of the  ureteral orifice.  I first began by resecting the tumor from the posterior wall the bladder and then resected the tumor from the trigone and right wall as well as the floor the bladder out toward the bladder neck. I was able to resect all of the obvious bladder tumor. I fulgurated the floor. I then observed and could see Fluorescein coming from the left UO and was able to identify the right ureteral orifice. In doing this I was able to push the scope against the bladder wall and actually even for some of the distal right ureteral mucosa and could see some small papillary areas in the very distal aspect of the ureteral orifice and therefore I felt that this likely represented tumor that had begun to advance up the right ureter. What I elected to do was resect the distal right ureter and did so and sent this separately as right ureteral orifice. I continued to note good effluxing did not fulgurate in the area of the distal right ureter. There was no bleeding at the end of the operation as all bleeding points had been cauterized with electrocautery.  Reinspection of the bladder revealed all obvious tumor had been fully resected and there was no evidence of perforation. The Microvasive evacuator was then used to irrigate the bladder and remove all of the portions of bladder tumor which were sent to pathology. I then removed the resectoscope.  A 20 French Foley catheter was then inserted in the bladder and irrigated. The irrigant returned slightly pink with no clots. The patient was awakened and taken to the recovery room.  While in the recovery room 40 mg of mitomycin-C in 40 cc of water were instilled  in the bladder through the catheter and the catheter was plugged. This will remain indwelling for approximately one hour. It will then be drained from the bladder and the catheter will be removed and the patient discharged home.  PLAN OF CARE: Discharge to home after PACU  PATIENT DISPOSITION:  PACU -  hemodynamically stable.

## 2016-09-02 NOTE — Progress Notes (Signed)
Only one bag of fluid started not two

## 2016-09-02 NOTE — H&P (Signed)
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 He experienced gross hematuria and underwent a CT scan on 08/01/16 in [place] which revealed a 2 mm distal right ureteral stone as well as a posterior wall mass. This was confirmed by cystoscopy on that same date to be a 3 mm papillary bladder tumor on the floor of the bladder on the right-hand side.   He has a past urologic history of having undergone TRUS/BX in 5/99 which revealed BPH only.  He also underwent transurethral resection of a solid mass that was found on CT scan in 4/08 and was noted to be adjacent to the right ureteral orifice. He underwent resection of this in 5/08 and had a double-J stent placed due to the proximity of the UO. The stent was removed and follow-up imaging revealed no evidence of hydronephrosis.  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  He also has known bilateral renal cyst that appeared simple by CT scan in 4/08.   He has BPH and has been treated with both finasteride and tamsulosin for number of years. He says he gets up once at night and really doesn't have any difficulties during the daytime but will double void with his first urination in the morning.   He reports he had a single episode where he noted his urine was coke or colon colored thinking it was very possibly some old blood in the urine but he never experienced any gross hematuria. He was found to have microscopic hematuria at the time of his routine physical exam.     CC: I have bladder cancer.  HPI: Tyler Pena is a 83 year-old male patient who was referred by Dr. Dion Body, MD who is here for bladder cancer.  The bladder cancer was found because of blood in his urine.   He has not received treatment for bladder cancer.   His last cysto was 08/01/2016.   He does not have a history of bleeding diathesis. His bowels are moving normally. He is not  having new bone pain. He has not recently had unwanted weight loss.   His last IVP or CT Scan was 08/01/2016.     ALLERGIES: None   MEDICATIONS: Aspirin 81 mg tablet, chewable  Finasteride 5 mg tablet  Hydrochlorothiazide 12.5 mg tablet  Metoprolol Succinate 25 mg tablet, extended release 24 hr  Tamsulosin Hcl 0.4 mg capsule, ext release 24 hr  Cinnamon 500 mg capsule  Coq-10  Fishoil  Losartan Potassium 50 mg tablet  Multivitamin  Pantoprazole Sodium 40 mg tablet, delayed release     GU PSH: None   NON-GU PSH: Cardiac Stent Placement Cholecystectomy (open) - 1973 Hernia Repair Hip Arthroscopy/surgery, Left Removal of skin cancer    GU PMH: None   NON-GU PMH: Abdominal aortic aneurysm Acute gastric ulcer with hemorrhage Arthritis Diabetes Type 2 GERD Heart disease, unspecified Hypertension    FAMILY HISTORY: 1 Daughter - Runs in Family 1 son - Runs in Family Gallbladder cancer - Mother Myocardial Infarction - Father   SOCIAL HISTORY: Marital Status: Married Current Smoking Status: Patient has never smoked.   Tobacco Use Assessment Completed: Used Tobacco in last 30 days? Has never drank.  Drinks 3 caffeinated drinks per day.    REVIEW OF SYSTEMS:    GU Review Male:   Patient reports frequent urination, get up at night to urinate, trouble starting your stream, and erection problems. Patient denies  hard to postpone urination, burning/ pain with urination, leakage of urine, stream starts and stops, have to strain to urinate , and penile pain.  Gastrointestinal (Upper):   Patient reports indigestion/ heartburn. Patient denies nausea and vomiting.  Gastrointestinal (Lower):   Patient reports diarrhea. Patient denies constipation.  Constitutional:   Patient denies fever, night sweats, weight loss, and fatigue.  Skin:   Patient denies skin rash/ lesion and itching.  Eyes:   Patient denies blurred vision and double vision.  Ears/ Nose/ Throat:   Patient denies sore  throat and sinus problems.  Hematologic/Lymphatic:   Patient denies swollen glands and easy bruising.  Cardiovascular:   Patient denies leg swelling and chest pains.  Respiratory:   Patient denies cough and shortness of breath.  Endocrine:   Patient denies excessive thirst.  Musculoskeletal:   Patient reports joint pain. Patient denies back pain.  Neurological:   Patient denies headaches and dizziness.  Psychologic:   Patient denies depression and anxiety.   VITAL SIGNS:    Weight 196 lb / 88.9 kg  Height 69 in / 175.26 cm  BP 121/67 mmHg  Pulse 70 /min  BMI 28.9 kg/m   GU PHYSICAL EXAMINATION:    Urethral Meatus: Normal size. No lesion, no wart, no discharge, no polyp. Normal location.  Penis: Circumcised, no warts, no cracks. No dorsal Peyronie's plaques, no left corporal Peyronie's plaques, no right corporal Peyronie's plaques, no scarring, no warts. No balanitis, no meatal stenosis.   MULTI-SYSTEM PHYSICAL EXAMINATION:    Constitutional: Well-nourished. No physical deformities. Normally developed. Good grooming.  Neck: Neck symmetrical, not swollen. Normal tracheal position.  Respiratory: No labored breathing, no use of accessory muscles.   Cardiovascular: Normal temperature, normal extremity pulses, no swelling, no varicosities.  Lymphatic: No enlargement of neck, axillae, groin.  Skin: No paleness, no jaundice, no cyanosis. No lesion, no ulcer, no rash.  Neurologic / Psychiatric: Oriented to time, oriented to place, oriented to person. No depression, no anxiety, no agitation.  Gastrointestinal: No mass, no tenderness, no rigidity, non obese abdomen.  Eyes: Normal conjunctivae. Normal eyelids.  Ears, Nose, Mouth, and Throat: Left ear no scars, no lesions, no masses. Right ear no scars, no lesions, no masses. Nose no scars, no lesions, no masses. Normal hearing. Normal lips.  Musculoskeletal: Normal gait and station of head and neck.     PAST DATA REVIEWED:  Source Of History:   Patient, Outside Source  Lab Test Review:   Path Report  Records Review:   Previous Doctor Records, Previous Hospital Records, POC Tool  X-Ray Review: C.T. Abdomen/Pelvis: Reviewed Report.     PROCEDURES:         Flexible Cystoscopy - 52000  Risks, benefits, and some of the potential complications of the procedure were discussed at length with the patient including infection, bleeding, voiding discomfort, urinary retention, fever, chills, sepsis, and others. All questions were answered. Informed consent was obtained. Sterile technique and 2% Lidocaine intraurethral analgesia were used.  Meatus:  Normal size. Normal location. Normal condition.  Urethra:  No strictures.  External Sphincter:  Normal.  Verumontanum:  Normal.  Prostate:  Obstructing. Severe hyperplasia.  Bladder Neck:  Non-obstructing.  Ureteral Orifices:  Normal location. Normal size. Normal shape. Effluxed clear urine.  Bladder:  His bladder had mild trabeculation. I noted a papillary lesion on the floor of the bladder extending to about the level of the bladder neck at about the 7 o'clock position. It appeared larger than 3 mm as previously  described.      The lower urinary tract was carefully examined. The procedure was well-tolerated and without complications. Instructions were given to call the office immediately for bloody urine, difficulty urinating, urinary retention, painful or frequent urination, fever or other illness. The patient stated that he understood these instructions and would comply with them.         Urinalysis w/Scope Dipstick Dipstick Cont'd Micro  Color: Yellow Bilirubin: Neg WBC/hpf: 10 - 20/hpf  Appearance: Clear Ketones: Neg RBC/hpf: 0 - 2/hpf  Specific Gravity: 1.025 Blood: Neg Bacteria: Mod (26-50/hpf)  pH: 5.5 Protein: Neg Cystals: NS (Not Seen)  Glucose: Neg Urobilinogen: 0.2 Casts: NS (Not Seen)    Nitrites: Positive Trichomonas: Not Present    Leukocyte Esterase: Trace Mucous: Not Present       Epithelial Cells: 0 - 5/hpf      Yeast: NS (Not Seen)      Sperm: Not Present    ASSESSMENT/PLAN:      ICD-10 Details  1 GU:   Bladder Cancer, overlapping sites - C67.8 I went over the results of the CT scan as well as my cystoscopic findings today which have revealed a bladder mass consistent with transitional cell carcinoma. We discussed the fact that currently there is no evidence of extravesical extension or pelvic adenopathy based on CT scan findings. Further characterization of the lesion is required for grading and staging purposes. We discussed proceeding with evaluation using transurethral resection of the lesion. I have discussed the procedure in detail as well as the potential risks and complications associated with this form of surgery. We also discussed the probability of successful resection of the intravesical portion of this lesion. I have recommended, as long as there is no contraindication at the time of surgery, the placement of intravesical mitomycin-C in order to reduce the risk of recurrence. We did discuss the potential side effects of this form of intravesical chemotherapy. The procedure will be performed under anesthesia as an outpatient.   2 NON-GU:   Bacteriuria - R82.71 I noted some white cells and bacteria in the urine with nitrite positivity. His urine was cultured and was negative.

## 2016-09-02 NOTE — Transfer of Care (Signed)
Immediate Anesthesia Transfer of Care Note  Patient: Hiroyuki Kerkman  Procedure(s) Performed: Procedure(s): TRANSURETHRAL RESECTION OF BLADDER TUMOR WITH MITOMYCIN-C (N/A)  Patient Location: PACU  Anesthesia Type:General  Level of Consciousness: awake, alert , oriented and patient cooperative  Airway & Oxygen Therapy: Patient Spontanous Breathing and Patient connected to nasal cannula oxygen  Post-op Assessment: Report given to RN and Post -op Vital signs reviewed and stable  Post vital signs: Reviewed and stable  Last Vitals:  Vitals:   09/02/16 0923  BP: (!) 157/66  Pulse: 61  Resp: 18  Temp: 36.5 C    Last Pain:  Vitals:   09/02/16 0923  TempSrc: Oral      Patients Stated Pain Goal: 7 (60/63/01 6010)  Complications: No apparent anesthesia complications

## 2016-09-02 NOTE — Progress Notes (Signed)
Up to bathroom to void without results. No distention noted on palpation. Denies feeling full or pressure. Dr. Karsten Ro aware. Additional orders given.

## 2016-09-02 NOTE — Progress Notes (Signed)
Inserted 18 Fr. Foley cath without difficulty for 315ml tea colored urine. Tolerated well. Daughter aware to return to [facility] on Monday for voiding trial.

## 2016-09-02 NOTE — Anesthesia Procedure Notes (Signed)
Procedure Name: LMA Insertion Date/Time: 09/02/2016 11:11 AM Performed by: Wanita Chamberlain Pre-anesthesia Checklist: Patient identified, Timeout performed, Emergency Drugs available, Suction available and Patient being monitored Patient Re-evaluated:Patient Re-evaluated prior to inductionOxygen Delivery Method: Circle system utilized Preoxygenation: Pre-oxygenation with 100% oxygen Intubation Type: IV induction Ventilation: Mask ventilation without difficulty LMA: LMA inserted LMA Size: 5.0 Number of attempts: 1 Placement Confirmation: positive ETCO2 and breath sounds checked- equal and bilateral Tube secured with: Tape Dental Injury: Teeth and Oropharynx as per pre-operative assessment

## 2016-09-02 NOTE — Anesthesia Preprocedure Evaluation (Addendum)
Anesthesia Evaluation  Patient identified by MRN, date of birth, ID band Patient awake    Reviewed: Allergy & Precautions, H&P , NPO status , Patient's Chart, lab work & pertinent test results  Airway Mallampati: II   Neck ROM: full    Dental  (+) Implants, Dental Advisory Given, Poor Dentition,    Pulmonary neg pulmonary ROS,    breath sounds clear to auscultation       Cardiovascular hypertension, Pt. on medications + CAD and + Cardiac Stents   Rhythm:regular Rate:Normal     Neuro/Psych    GI/Hepatic hiatal hernia, GERD  Medicated and Controlled,  Endo/Other  diabetes, Well Controlled, Type 2  Renal/GU Bladder tumor   Bladder CA    Musculoskeletal  (+) Arthritis ,   Abdominal   Peds  Hematology   Anesthesia Other Findings   Reproductive/Obstetrics                           Anesthesia Physical Anesthesia Plan  ASA: III  Anesthesia Plan: General   Post-op Pain Management:    Induction: Intravenous  Airway Management Planned: LMA  Additional Equipment:   Intra-op Plan:   Post-operative Plan:   Informed Consent: I have reviewed the patients History and Physical, chart, labs and discussed the procedure including the risks, benefits and alternatives for the proposed anesthesia with the patient or authorized representative who has indicated his/her understanding and acceptance.     Plan Discussed with: CRNA, Anesthesiologist and Surgeon  Anesthesia Plan Comments:         Anesthesia Quick Evaluation

## 2016-09-06 ENCOUNTER — Encounter (HOSPITAL_BASED_OUTPATIENT_CLINIC_OR_DEPARTMENT_OTHER): Payer: Self-pay | Admitting: Urology

## 2016-09-20 ENCOUNTER — Inpatient Hospital Stay (HOSPITAL_BASED_OUTPATIENT_CLINIC_OR_DEPARTMENT_OTHER): Payer: Medicare Other | Admitting: Oncology

## 2016-09-20 VITALS — BP 128/67 | HR 57 | Temp 96.5°F | Resp 16 | Wt 200.1 lb

## 2016-09-20 DIAGNOSIS — Z9049 Acquired absence of other specified parts of digestive tract: Secondary | ICD-10-CM

## 2016-09-20 DIAGNOSIS — R599 Enlarged lymph nodes, unspecified: Secondary | ICD-10-CM | POA: Diagnosis not present

## 2016-09-20 DIAGNOSIS — R3912 Poor urinary stream: Secondary | ICD-10-CM | POA: Diagnosis not present

## 2016-09-20 DIAGNOSIS — C679 Malignant neoplasm of bladder, unspecified: Secondary | ICD-10-CM | POA: Diagnosis not present

## 2016-09-20 DIAGNOSIS — E119 Type 2 diabetes mellitus without complications: Secondary | ICD-10-CM

## 2016-09-20 DIAGNOSIS — Z85820 Personal history of malignant melanoma of skin: Secondary | ICD-10-CM | POA: Diagnosis not present

## 2016-09-20 DIAGNOSIS — N401 Enlarged prostate with lower urinary tract symptoms: Secondary | ICD-10-CM

## 2016-09-20 DIAGNOSIS — K219 Gastro-esophageal reflux disease without esophagitis: Secondary | ICD-10-CM

## 2016-09-20 DIAGNOSIS — I2510 Atherosclerotic heart disease of native coronary artery without angina pectoris: Secondary | ICD-10-CM

## 2016-09-20 DIAGNOSIS — I10 Essential (primary) hypertension: Secondary | ICD-10-CM | POA: Diagnosis not present

## 2016-09-20 DIAGNOSIS — N281 Cyst of kidney, acquired: Secondary | ICD-10-CM

## 2016-09-20 NOTE — Progress Notes (Signed)
Patient here today for follow up.  Patient states no new concerns today  

## 2016-09-20 NOTE — Progress Notes (Signed)
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 He also had a cystoscopy on 08/01/2016 which showed a bladder mass but no biopsy was done. His other medical problems include hypertension coronary artery disease as well as BPH. He has had prior abdominal surgeries for hernia repair as well as calyceal lysis  CT abdomen on 07/30/2016 showed: Liver and spleen are normal. There has been prior cholecystectomy. Bile duct, pancreas and adrenal glands are normal. There are no renal calculi or hydronephrosis. There are bilateral renal cysts without any evidence of solid renal masses. Renal pelvis and left ureters are normal. There is a 2 mm distal right ureteral calculus adjacent to the trigone there is a right posterior lateral bladder wall mass enhancing approximately 40 Hounsfield units suspicious for primary bladder neoplasm. Prior ventral herniorrhaphy with stable concentric adenopathy. This CT was compared to April 2017. In April 2017 patient did not have any abdominal or pelvic lymphadenopathy. Cystoscopy on 08/01/2016 by Dr. Velna Hatchet showed a papillary appearing bladder tumor on the right lower. The right ureteral orifice was not identified. There were no additional bladder tumors identified.   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 He had a right double J stent placement because of the  proximity of the mass to the right orifice. Patient has also had prostate biopsies in 1999 which were negative for malignancy.  Patient has been seen by Dr.Ottelin from urology and underwent a repeat cystoscopy recently which again revealed bladder tumor and the right side of the bladder wall suspicious for transitional cell carcinoma  Patient underwent TURBT on 09/02/2016 which showed a low grade in situ non invasive papillary urothelial carcinoma. Muscularis propria is present and is not involved. Biopsy of the ureters showed benign fibromuscular tissue and small focus of cauterized urothelium Interval history- doing well since his TURBT. Denies any complaints  Review of systems- Review of Systems  Constitutional: Negative for chills, fever, malaise/fatigue and weight loss.  HENT: Negative for congestion, ear discharge and nosebleeds.   Eyes: Negative for blurred vision.  Respiratory: Negative for cough, hemoptysis, sputum production, shortness of breath and wheezing.   Cardiovascular: Negative for chest pain, palpitations, orthopnea and claudication.  Gastrointestinal: Negative for abdominal pain, blood in stool, constipation, diarrhea, heartburn, melena, nausea and vomiting.  Genitourinary: Negative for dysuria, flank pain, frequency, hematuria and urgency.  Musculoskeletal: Negative for back pain, joint pain and myalgias.  Skin: Negative for rash.  Neurological: Negative for dizziness, tingling, focal weakness, seizures, weakness and headaches.  Endo/Heme/Allergies: Does not bruise/bleed easily.  Psychiatric/Behavioral: Negative for depression and suicidal ideas. The patient does not have insomnia.        No Known Allergies   Past Medical History:  Diagnosis Date  . Arthritis   . Bilateral renal cysts   . Bladder cancer [facility])    urologist-  dr Karsten Ro  . BPH (benign prostatic hyperplasia)   . Coronary  artery disease    hx PCI w/ stenting in 10/ 2014 in [place]--- recently  moved from [place] jan 2018 has not established a cardiologist yet but does have pcp   . Dyslipidemia   . Essential hypertension   . GERD (gastroesophageal reflux disease)   . Hematuria   . History of gastric ulcer   . History of hiatal hernia   . History of kidney stones   . History of melanoma in situ    several Excision's in situ and malignant melanoma's--  last one 06/ 2017 top of ear excision Stage 1 w/ negative margins/  other have been right upper arm, shoulder, chest, face  . History of MI (myocardial infarction)    10/ 2014-- s/p  PCI and stenting x2  . History of small bowel obstruction    fall 2017  s/p  abdominal lysis adhesions  (prior hx bowel obstruction total 8 times)  . Nocturia   . S/P right coronary artery (RCA) stent placement    10/ 2014  x2  . Type 2 diabetes, diet controlled ([place])   . Wears glasses   . Wears hearing aid    bilateral     Past Surgical History:  Procedure Laterality Date  . ABDOMINAL HERNIA REPAIR  2012  . ADDOMINAL LYSIS ADHESIONS  fall 2017   sbo  . CHOLECYSTECTOMY OPEN  1971   and Appendectomy  . CORONARY ANGIOPLASTY WITH STENT PLACEMENT  04-17-2013  in [place]   x2 stents to RCA  . INGUINAL HERNIA REPAIR Bilateral 1995  . TOTAL HIP ARTHROPLASTY Left 2008  . TRANSURETHRAL RESECTION OF BLADDER TUMOR  09/2006   LEIOMYOMA   . TRANSURETHRAL RESECTION OF BLADDER TUMOR WITH MITOMYCIN-C N/A 09/02/2016   Procedure: TRANSURETHRAL RESECTION OF BLADDER TUMOR WITH MITOMYCIN-C;  Surgeon: Kathie Rhodes, MD;  Location: [facility];  Service: Urology;  Laterality: N/A;    Social History   Social History  . Marital status: Married    Spouse name: N/A  . Number of children: N/A  . Years of education: N/A   Occupational History  . Not on file.   Social History Main Topics  . Smoking status: Never Smoker  . Smokeless tobacco: Never Used  . Alcohol use Yes     Comment: occasional  . Drug use: No  . Sexual activity: Not on  file   Other Topics Concern  . Not on file   Social History Narrative  . No narrative on file    No family history on file.   Current Outpatient Prescriptions:  .  aspirin EC 81 MG tablet, Take 81 mg by mouth daily., Disp: , Rfl:  .  Black Pepper-Turmeric 3-500 MG CAPS, Take 2 capsules by mouth daily., Disp: , Rfl:  .  Chromium-Cinnamon (CINNAMON PLUS CHROMIUM PO), Take 1 tablet by mouth 2 (two) times daily. 1000mg  cinnamon/  250mg  chromium, Disp: , Rfl:  .  finasteride (PROSCAR) 5 MG tablet, Take 5 mg by mouth every evening. , Disp: , Rfl:  .  hydrochlorothiazide (HYDRODIURIL) 25 MG tablet, Take 12.5 mg by mouth every morning. , Disp: , Rfl:  .  losartan (COZAAR) 50 MG tablet, Take 50 mg by mouth every evening. , Disp: , Rfl:  .  metoprolol tartrate (LOPRESSOR) 25 MG tablet, Take 50 mg by mouth every evening. , Disp: , Rfl:  .  Multiple Vitamin (MULTIVITAMIN WITH MINERALS) TABS tablet, Take 1 tablet by mouth daily., Disp: , Rfl:  .  Omega-3 Fatty Acids (SUPER OMEGA 3 EPA/DHA) 1000 MG CAPS, Take 1 capsule by mouth daily. Omega 3 - DHA - EPA - Fish oil-- 1000mg / 120mg / 180mg , Disp: , Rfl:  .  pantoprazole (PROTONIX) 40 MG tablet, Take 40 mg by mouth every evening. , Disp: , Rfl:  .  phenazopyridine (PYRIDIUM) 200 MG tablet, Take 1 tablet (200 mg total) by mouth 3 (three) times daily as needed for pain., Disp: 16 tablet, Rfl: 0 .  tamsulosin (FLOMAX) 0.4 MG CAPS capsule, Take 0.4 mg by mouth every evening. , Disp: , Rfl:   Physical exam:  Vitals:   09/20/16 1354  BP: 128/67  Pulse: (!) 57  Resp: 16  Temp: (!) 96.5 F (35.8 C)  TempSrc: Tympanic  Weight: 200 lb 1 oz (90.7 kg)   Physical Exam  Constitutional: He is oriented to person, place, and time and well-developed, well-nourished, and in no distress.  HENT:  Head: Normocephalic and atraumatic.  Eyes: EOM are normal. Pupils are equal, round, and reactive to light.  Neck: Normal range of motion.  Cardiovascular: Normal rate,  regular rhythm and normal heart sounds.   Pulmonary/Chest: Effort normal and breath sounds normal.  Abdominal: Soft. Bowel sounds are normal.  Neurological: He is alert and oriented to person, place, and time.  Skin: Skin is warm and dry.     CMP Latest Ref Rng & Units 09/02/2016  Glucose 65 - 99 mg/dL 143(H)  BUN 6 - 20 mg/dL 17  Creatinine 0.61 - 1.24 mg/dL 0.80  Sodium 135 - 145 mmol/L 144  Potassium 3.5 - 5.1 mmol/L 3.8  Chloride 101 - 111 mmol/L 105   CBC Latest Ref Rng & Units 09/02/2016  Hemoglobin 13.0 - 17.0 g/dL 12.9(L)  Hematocrit 39.0 - 52.0 % 38.0(L)      Assessment and plan- Patient is a 83 y.o. male with newly diagnosed in situ and noninvasive papillary urothelial carcinoma  I explained to the patient the results of the pathology which showed that he has noninvasive papillary urothelial carcinoma. Muscularis propria was identified in the specimen and was not involved. This has already been discussed by Dr. Velna Hatchet as well and given patient's age plan was to forego intravesical BCG and continue with periodic cystoscopy and ultrasound. There is no role for chemotherapy or radiation at this time  At this time patient can continue to follow-up with urology and does not need an oncology follow-up at this time. He can be referred was in the future if there is any evidence of muscle invasive bladder cancer metastatic disease   Visit Diagnosis 1. Superficial carcinoma of urinary bladder (HCC)      Dr. Randa Evens, MD, MPH [facility] Pager- [phone] 09/20/2016 1:56 PM

## 2016-10-07 ENCOUNTER — Observation Stay
Admission: EM | Admit: 2016-10-07 | Discharge: 2016-10-08 | Disposition: A | Discharge status: Home / Self Care | Payer: Medicare Other | Attending: Internal Medicine | Admitting: Internal Medicine

## 2016-10-07 ENCOUNTER — Encounter: Payer: Self-pay | Admitting: Emergency Medicine

## 2016-10-07 ENCOUNTER — Emergency Department: Payer: Medicare Other

## 2016-10-07 DIAGNOSIS — R0789 Other chest pain: Principal | ICD-10-CM | POA: Insufficient documentation

## 2016-10-07 DIAGNOSIS — Z7982 Long term (current) use of aspirin: Secondary | ICD-10-CM | POA: Insufficient documentation

## 2016-10-07 DIAGNOSIS — R0602 Shortness of breath: Secondary | ICD-10-CM | POA: Diagnosis present

## 2016-10-07 DIAGNOSIS — Z79899 Other long term (current) drug therapy: Secondary | ICD-10-CM | POA: Insufficient documentation

## 2016-10-07 DIAGNOSIS — I712 Thoracic aortic aneurysm, without rupture: Secondary | ICD-10-CM | POA: Insufficient documentation

## 2016-10-07 DIAGNOSIS — E785 Hyperlipidemia, unspecified: Secondary | ICD-10-CM | POA: Insufficient documentation

## 2016-10-07 DIAGNOSIS — Z8551 Personal history of malignant neoplasm of bladder: Secondary | ICD-10-CM | POA: Diagnosis not present

## 2016-10-07 DIAGNOSIS — I2510 Atherosclerotic heart disease of native coronary artery without angina pectoris: Secondary | ICD-10-CM | POA: Insufficient documentation

## 2016-10-07 DIAGNOSIS — E119 Type 2 diabetes mellitus without complications: Secondary | ICD-10-CM | POA: Diagnosis not present

## 2016-10-07 DIAGNOSIS — R531 Weakness: Secondary | ICD-10-CM

## 2016-10-07 DIAGNOSIS — Z955 Presence of coronary angioplasty implant and graft: Secondary | ICD-10-CM | POA: Diagnosis not present

## 2016-10-07 DIAGNOSIS — N400 Enlarged prostate without lower urinary tract symptoms: Secondary | ICD-10-CM | POA: Diagnosis not present

## 2016-10-07 DIAGNOSIS — Z85820 Personal history of malignant melanoma of skin: Secondary | ICD-10-CM | POA: Diagnosis not present

## 2016-10-07 DIAGNOSIS — I252 Old myocardial infarction: Secondary | ICD-10-CM | POA: Diagnosis not present

## 2016-10-07 DIAGNOSIS — I509 Heart failure, unspecified: Secondary | ICD-10-CM | POA: Diagnosis not present

## 2016-10-07 DIAGNOSIS — R3912 Poor urinary stream: Secondary | ICD-10-CM | POA: Insufficient documentation

## 2016-10-07 DIAGNOSIS — E876 Hypokalemia: Secondary | ICD-10-CM | POA: Diagnosis not present

## 2016-10-07 DIAGNOSIS — K219 Gastro-esophageal reflux disease without esophagitis: Secondary | ICD-10-CM | POA: Diagnosis not present

## 2016-10-07 DIAGNOSIS — I110 Hypertensive heart disease with heart failure: Secondary | ICD-10-CM | POA: Diagnosis not present

## 2016-10-07 DIAGNOSIS — R079 Chest pain, unspecified: Secondary | ICD-10-CM | POA: Diagnosis present

## 2016-10-07 LAB — BASIC METABOLIC PANEL
ANION GAP: 7 (ref 5–15)
BUN: 17 mg/dL (ref 6–20)
CALCIUM: 8.9 mg/dL (ref 8.9–10.3)
CHLORIDE: 99 mmol/L — AB (ref 101–111)
CO2: 28 mmol/L (ref 22–32)
CREATININE: 0.88 mg/dL (ref 0.61–1.24)
GFR calc non Af Amer: >60 mL/min (ref >60)
Glucose, Bld: 187 mg/dL — ABNORMAL HIGH (ref 65–99)
Potassium: 3.3 mmol/L — ABNORMAL LOW (ref 3.5–5.1)
SODIUM: 134 mmol/L — AB (ref 135–145)

## 2016-10-07 LAB — TROPONIN I

## 2016-10-07 LAB — CBC
HCT: 40.8 % (ref 40.0–52.0)
HEMOGLOBIN: 13.9 g/dL (ref 13.0–18.0)
MCH: 30 pg (ref 26.0–34.0)
MCHC: 34.1 g/dL (ref 32.0–36.0)
MCV: 87.8 fL (ref 80.0–100.0)
PLATELETS: 206 10*3/uL (ref 150–440)
RBC: 4.65 MIL/uL (ref 4.40–5.90)
RDW: 14.6 % — ABNORMAL HIGH (ref 11.5–14.5)
WBC: 9.5 10*3/uL (ref 3.8–10.6)

## 2016-10-07 LAB — TSH: TSH: 1.567 u[IU]/mL (ref 0.350–4.500)

## 2016-10-07 LAB — FIBRIN DERIVATIVES D-DIMER (ARMC ONLY): FIBRIN DERIVATIVES D-DIMER (ARMC): 545.11 — AB (ref 0.00–499.00)

## 2016-10-07 LAB — BRAIN NATRIURETIC PEPTIDE: B Natriuretic Peptide: 77 pg/mL (ref 0.0–100.0)

## 2016-10-07 MED ORDER — ALPRAZOLAM 0.25 MG PO TABS: 0.2500 mg | ORAL_TABLET | Freq: Two times a day (BID) | ORAL | Status: DC | PRN

## 2016-10-07 MED ORDER — INSULIN ASPART 100 UNIT/ML ~~LOC~~ SOLN: 0.0000 [IU] | Freq: Every day | SUBCUTANEOUS | Status: DC

## 2016-10-07 MED ORDER — LOSARTAN POTASSIUM 50 MG PO TABS: 50.0000 mg | ORAL_TABLET | Freq: Every evening | ORAL | Status: DC

## 2016-10-07 MED ORDER — GI COCKTAIL ~~LOC~~
30.0000 mL | Freq: Four times a day (QID) | ORAL | Status: DC | PRN
  Filled 2016-10-07: qty 30

## 2016-10-07 MED ORDER — ASPIRIN EC 81 MG PO TBEC
81.0000 mg | DELAYED_RELEASE_TABLET | Freq: Every day | ORAL | Status: DC
  Administered 2016-10-08: 81 mg via ORAL
  Filled 2016-10-07: qty 1

## 2016-10-07 MED ORDER — TAMSULOSIN HCL 0.4 MG PO CAPS: 0.4000 mg | ORAL_CAPSULE | Freq: Every evening | ORAL | Status: DC

## 2016-10-07 MED ORDER — METOPROLOL TARTRATE 50 MG PO TABS: 50.0000 mg | ORAL_TABLET | Freq: Every evening | ORAL | Status: DC

## 2016-10-07 MED ORDER — IOPAMIDOL (ISOVUE-370) INJECTION 76%
75.0000 mL | Freq: Once | INTRAVENOUS | Status: AC | PRN
  Administered 2016-10-07: 75 mL via INTRAVENOUS

## 2016-10-07 MED ORDER — ACETAMINOPHEN 325 MG PO TABS: 650.0000 mg | ORAL_TABLET | ORAL | Status: DC | PRN

## 2016-10-07 MED ORDER — MORPHINE SULFATE (PF) 4 MG/ML IV SOLN: 2.0000 mg | INTRAVENOUS | Status: DC | PRN

## 2016-10-07 MED ORDER — INSULIN ASPART 100 UNIT/ML ~~LOC~~ SOLN: 0.0000 [IU] | Freq: Three times a day (TID) | SUBCUTANEOUS | Status: DC

## 2016-10-07 MED ORDER — SUPER OMEGA 3 EPA/DHA 1000 MG PO CAPS: 1.0000 | ORAL_CAPSULE | Freq: Every day | ORAL | Status: DC

## 2016-10-07 MED ORDER — ADULT MULTIVITAMIN W/MINERALS CH
1.0000 | ORAL_TABLET | Freq: Every day | ORAL | Status: DC
  Administered 2016-10-08: 1 via ORAL
  Filled 2016-10-07: qty 1

## 2016-10-07 MED ORDER — HYDROCHLOROTHIAZIDE 25 MG PO TABS
12.5000 mg | ORAL_TABLET | Freq: Every morning | ORAL | Status: DC
  Administered 2016-10-08: 12.5 mg via ORAL
  Filled 2016-10-07: qty 1

## 2016-10-07 MED ORDER — SODIUM CHLORIDE 0.9 % IV SOLN
INTRAVENOUS | Status: DC
  Administered 2016-10-08: via INTRAVENOUS

## 2016-10-07 MED ORDER — ONDANSETRON HCL 4 MG/2ML IJ SOLN: 4.0000 mg | Freq: Four times a day (QID) | INTRAMUSCULAR | Status: DC | PRN

## 2016-10-07 MED ORDER — SODIUM CHLORIDE 0.9 % IV BOLUS (SEPSIS)
500.0000 mL | Freq: Once | INTRAVENOUS | Status: AC
  Administered 2016-10-07: 500 mL via INTRAVENOUS

## 2016-10-07 MED ORDER — FINASTERIDE 5 MG PO TABS: 5.0000 mg | ORAL_TABLET | Freq: Every evening | ORAL | Status: DC

## 2016-10-07 MED ORDER — PANTOPRAZOLE SODIUM 40 MG PO TBEC: 40.0000 mg | DELAYED_RELEASE_TABLET | Freq: Every evening | ORAL | Status: DC

## 2016-10-07 MED ORDER — ZOLPIDEM TARTRATE 5 MG PO TABS: 5.0000 mg | ORAL_TABLET | Freq: Every evening | ORAL | Status: DC | PRN

## 2016-10-07 NOTE — ED Provider Notes (Signed)
[facility] Emergency Department Provider Note    None    (approximate)  I have reviewed the triage vital signs and the nursing notes.   HISTORY  Chief Complaint Air hunger   HPI Tyler Pena is a very pleasant 83 y.o. male with a history of MI status post PCI in 2014 presents with intermittent chest pressure and "air hunger" cystoscopy shortness of breath that started last night. Patient states is been feeling generalized weakness throughout the day. States he was trying to help with cleaning his house by vacuuming this morning and became very short of breath. States that his previous MI his only symptoms were shortness of breath and diaphoresis. He has not had any diaphoresis. Denies any active chest pain right now but becomes very winded with walking. As noted intermittent swelling to his lower extremities. Denies any recent fevers. No history of COPD or bronchitis.   Past Medical History:  Diagnosis Date  . Arthritis   . Bilateral renal cysts   . Bladder cancer [facility])    urologist-  dr Karsten Ro  . BPH (benign prostatic hyperplasia)   . Coronary artery disease    hx PCI w/ stenting in 10/ 2014 in [place]--- recently moved from [place] jan 2018 has not established a cardiologist yet but does have pcp   . Dyslipidemia   . Essential hypertension   . GERD (gastroesophageal reflux disease)   . Hematuria   . History of gastric ulcer   . History of hiatal hernia   . History of kidney stones   . History of melanoma in situ    several Excision's in situ and malignant melanoma's--  last one 06/ 2017 top of ear excision Stage 1 w/ negative margins/  other have been right upper arm, shoulder, chest, face  . History of MI (myocardial infarction)    10/ 2014-- s/p  PCI and stenting x2  . History of small bowel obstruction    fall 2017  s/p  abdominal lysis adhesions  (prior hx bowel obstruction total 8 times)  . Nocturia   . S/P right coronary artery  (RCA) stent placement    10/ 2014  x2  . Type 2 diabetes, diet controlled ([place])   . Wears glasses   . Wears hearing aid    bilateral   History reviewed. No pertinent family history. Past Surgical History:  Procedure Laterality Date  . ABDOMINAL HERNIA REPAIR  2012  . ADDOMINAL LYSIS ADHESIONS  fall 2017   [facility]  . CHOLECYSTECTOMY OPEN  1971   and Appendectomy  . CORONARY ANGIOPLASTY WITH STENT PLACEMENT  04-17-2013  in [place]   x2 stents to RCA  . INGUINAL HERNIA REPAIR Bilateral 1995  . TOTAL HIP ARTHROPLASTY Left 2008  . TRANSURETHRAL RESECTION OF BLADDER TUMOR  09/2006   LEIOMYOMA   . TRANSURETHRAL RESECTION OF BLADDER TUMOR WITH MITOMYCIN-C N/A 09/02/2016   Procedure: TRANSURETHRAL RESECTION OF BLADDER TUMOR WITH MITOMYCIN-C;  Surgeon: Kathie Rhodes, MD;  Location: [facility];  Service: Urology;  Laterality: N/A;   Patient Active Problem List   Diagnosis Date Noted  . Bladder tumor 09/02/2016  . Benign prostatic hyperplasia with weak urinary stream 08/18/2016  . Coronary artery disease involving native coronary artery of native heart without angina pectoris 08/18/2016  . Essential hypertension 08/18/2016  . GERD without esophagitis 08/18/2016      Prior to Admission medications   Medication Sig Start Date End Date Taking? Authorizing Provider  aspirin EC 81 MG tablet Take 81 mg by mouth daily.    Historical Provider, MD  Black Pepper-Turmeric 3-500 MG CAPS Take 2 capsules by mouth daily.    Historical Provider, MD  Chromium-Cinnamon (CINNAMON PLUS CHROMIUM PO) Take 1 tablet by mouth 2 (two) times daily. 1000mg  cinnamon/  250mg  chromium    Historical Provider, MD  finasteride (PROSCAR) 5 MG tablet Take 5 mg by mouth every evening.     Historical Provider, MD  hydrochlorothiazide (HYDRODIURIL) 25 MG tablet Take 12.5 mg by mouth every morning.     Historical Provider, MD  losartan (COZAAR) 50 MG tablet Take 50 mg by mouth every evening.     Historical  Provider, MD  metoprolol tartrate (LOPRESSOR) 25 MG tablet Take 50 mg by mouth every evening.     Historical Provider, MD  Multiple Vitamin (MULTIVITAMIN WITH MINERALS) TABS tablet Take 1 tablet by mouth daily.    Historical Provider, MD  Omega-3 Fatty Acids (SUPER OMEGA 3 EPA/DHA) 1000 MG CAPS Take 1 capsule by mouth daily. Omega 3 - DHA - EPA - Fish oil-- 1000mg / 120mg / 180mg     Historical Provider, MD  pantoprazole (PROTONIX) 40 MG tablet Take 40 mg by mouth every evening.     Historical Provider, MD  phenazopyridine (PYRIDIUM) 200 MG tablet Take 1 tablet (200 mg total) by mouth 3 (three) times daily as needed for pain. 09/02/16   Kathie Rhodes, MD  tamsulosin (FLOMAX) 0.4 MG CAPS capsule Take 0.4 mg by mouth every evening.     Historical Provider, MD    Allergies Patient has no known allergies.    Social History Social History  Substance Use Topics  . Smoking status: Never Smoker  . Smokeless tobacco: Never Used  . Alcohol use Yes     Comment: occasional    Review of Systems Patient denies headaches, rhinorrhea, blurry vision, numbness, shortness of breath, chest pain, edema, cough, abdominal pain, nausea, vomiting, diarrhea, dysuria, fevers, rashes or hallucinations unless otherwise stated above in HPI. ____________________________________________   PHYSICAL EXAM:  VITAL SIGNS: Vitals:   10/07/16 1630 10/07/16 1636  BP: (!) 143/69   Pulse: (!) 102 (!) 105  Temp: 98.4 F (36.9 C)     Constitutional: Alert and oriented. Well appearing and in no acute distress. Eyes: Conjunctivae are normal. PERRL. EOMI. Head: Atraumatic. Nose: No congestion/rhinnorhea. Mouth/Throat: Mucous membranes are moist.  Oropharynx non-erythematous. Neck: No stridor. Painless ROM. No cervical spine tenderness to palpation Hematological/Lymphatic/Immunilogical: No cervical lymphadenopathy. Cardiovascular: Normal rate, regular rhythm. Grossly normal heart sounds.  Good peripheral  circulation. Respiratory: Normal respiratory effort.  No retractions. Lungs CTAB. Gastrointestinal: Soft and nontender. No distention. No abdominal bruits. No CVA tenderness. Musculoskeletal: No lower extremity tenderness, trace edema.  No joint effusions. Neurologic:  Normal speech and language. No gross focal neurologic deficits are appreciated. No gait instability. Skin:  Skin is warm, dry and intact. No rash noted. Psychiatric: Mood and affect are normal. Speech and behavior are normal.  ____________________________________________   LABS (all labs ordered are listed, but only abnormal results are displayed)  Results for orders placed or performed during the hospital encounter of 10/07/16 (from the past 24 hour(s))  Basic metabolic panel     Status: Abnormal   Collection Time: 10/07/16  4:36 PM  Result Value Ref Range   Sodium 134 (L) 135 - 145 mmol/L   Potassium 3.3 (L) 3.5 - 5.1 mmol/L   Chloride 99 (L) 101 - 111 mmol/L   CO2 28 22 -  32 mmol/L   Glucose, Bld 187 (H) 65 - 99 mg/dL   BUN 17 6 - 20 mg/dL   Creatinine, Ser 0.88 0.61 - 1.24 mg/dL   Calcium 8.9 8.9 - 10.3 mg/dL   GFR calc non Af Amer >60 >60 mL/min   GFR calc Af Amer >60 >60 mL/min   Anion gap 7 5 - 15  CBC     Status: Abnormal   Collection Time: 10/07/16  4:36 PM  Result Value Ref Range   WBC 9.5 3.8 - 10.6 K/uL   RBC 4.65 4.40 - 5.90 MIL/uL   Hemoglobin 13.9 13.0 - 18.0 g/dL   HCT 40.8 40.0 - 52.0 %   MCV 87.8 80.0 - 100.0 fL   MCH 30.0 26.0 - 34.0 pg   MCHC 34.1 32.0 - 36.0 g/dL   RDW 14.6 (H) 11.5 - 14.5 %   Platelets 206 150 - 440 K/uL  Troponin I     Status: None   Collection Time: 10/07/16  4:36 PM  Result Value Ref Range   Troponin I <0.03 <0.03 ng/mL   ____________________________________________  EKG My review and personal interpretation at Time: 16:35   Indication: chest pain  Rate: 105  Rhythm: sinus Axis: normal Other: normal intervals, no st  elevation ____________________________________________  RADIOLOGY  I personally reviewed all radiographic images ordered to evaluate for the above acute complaints and reviewed radiology reports and findings.  These findings were personally discussed with the patient.  Please see medical record for radiology report.  ____________________________________________   PROCEDURES  Procedure(s) performed:  Procedures    Critical Care performed: no ____________________________________________   INITIAL IMPRESSION / ASSESSMENT AND PLAN / ED COURSE  Pertinent labs & imaging results that were available during my care of the patient were reviewed by me and considered in my medical decision making (see chart for details).  DDX: ACS, pericarditis, esophagitis, boerhaaves, pe, dissection, pna, bronchitis, costochondritis   Nyshawn Gowdy is a 83 y.o. who presents to the ED with weakness and shortness of breath. Patient arrives in no acute distress but is mildly tachycardic. Breath sounds in exam as above. EKG shows no evidence of acute ischemia and his initial troponin is negative. He denies any chest pain at this moment but describes this discomfort is the similar to last time he had an MI. He's been compliant with his medications. D-dimer ordered to evaluate and further risk stratify for pulmonary embolism is above the upper limits of normal and given his tachycardia with another expedition for his shortness of breath I did order lower extremity Dopplers to evaluate for DVT. There is no evidence of DVT but given the persistent symptoms CT angiogram was ordered after discussion with the patient regarding the risks associated with contrast-induced nephropathy to evaluate for pulmonary embolism. Patient states that he would prefer to pursue all diagnostics.  CT angiogram ordered. Do feel patient would benefit from admission for chest pain risk stratification given his description of events, previous  stents and comorbidities. Discussed case with Dr. Ara Kussmaul who kindly agrees to the patient for further evaluation and management.  Have discussed with the patient and available family all diagnostics and treatments performed thus far and all questions were answered to the best of my ability. The patient demonstrates understanding and agreement with plan.       ____________________________________________   FINAL CLINICAL IMPRESSION(S) / ED DIAGNOSES  Final diagnoses:  Shortness of breath  Weakness      NEW MEDICATIONS STARTED DURING THIS  VISIT:  New Prescriptions   No medications on file     Note:  This document was prepared using Dragon voice recognition software and may include unintentional dictation errors.    Merlyn Lot, MD 10/07/16 2124

## 2016-10-07 NOTE — ED Notes (Signed)
Patient transported to CT 

## 2016-10-07 NOTE — ED Triage Notes (Signed)
Pt reports intermittent chest pressure, "air hunger" and shortness of breath today. Pt also with c/o generalized weakness, pt reports hx of posterior MI in 2014 and had 2 cardiac stent. Pt arrived with home oxygen in use, pt reports "made me feel better" but isn't on chronic oxygen.

## 2016-10-07 NOTE — H&P (Signed)
[place] @ [facility] Admission History and Physical Harvie Bridge, D.O.  ---------------------------------------------------------------------------------------------------------------------   PATIENT NAME: Tyler Pena MR#: [id number] DATE OF BIRTH: [birth year] DATE OF ADMISSION: 10/07/2016 PRIMARY CARE PHYSICIAN: Dion Body, MD  REQUESTING/REFERRING PHYSICIAN: ED Dr. Quentin Cornwall  CHIEF COMPLAINT: No chief complaint on file.   HISTORY OF PRESENT ILLNESS: Hridaan Bouse is a 83 y.o. male with a known history of OA, bladder cancer, BPH, coronary artery disease status post PCI with stents, hypertension, hyperlipidemia, GERD, diabetes presents to the emergency department for evaluation of chest pain.  Patient was in a usual state of health until last night when he describes a sudden onset of chest tightness associated with a gasping for air while he was vacuuming. He denies any associated diaphoresis, shortness of breath, nausea. He states he had similar symptoms when he had a posterior MI in 2014. His symptoms have resolved.  Patient is independent with excellent functional capacity. He is a former Engineer, drilling.  Otherwise there has been no change in status. Patient has been taking medication as prescribed and there has been no recent change in medication or diet.  There has been no recent illness, travel or sick contacts.    Patient denies fevers/chills, weakness, dizziness, N/V/C/D, abdominal pain, dysuria/frequency, changes in mental status.   PAST MEDICAL HISTORY: Past Medical History:  Diagnosis Date  . Arthritis   . Bilateral renal cysts   . Bladder cancer [facility])    urologist-  dr Karsten Ro  . BPH (benign prostatic hyperplasia)   . Coronary artery disease    hx PCI w/ stenting in 10/ 2014 in [place]--- recently moved from [place] jan 2018 has not established a cardiologist yet but does have pcp   . Dyslipidemia   . Essential hypertension   . GERD  (gastroesophageal reflux disease)   . Hematuria   . History of gastric ulcer   . History of hiatal hernia   . History of kidney stones   . History of melanoma in situ    several Excision's in situ and malignant melanoma's--  last one 06/ 2017 top of ear excision Stage 1 w/ negative margins/  other have been right upper arm, shoulder, chest, face  . History of MI (myocardial infarction)    10/ 2014-- s/p  PCI and stenting x2  . History of small bowel obstruction    fall 2017  s/p  abdominal lysis adhesions  (prior hx bowel obstruction total 8 times)  . Nocturia   . S/P right coronary artery (RCA) stent placement    10/ 2014  x2  . Type 2 diabetes, diet controlled ([place])   . Wears glasses   . Wears hearing aid    bilateral     PAST SURGICAL HISTORY: Past Surgical History:  Procedure Laterality Date  . ABDOMINAL HERNIA REPAIR  2012  . ADDOMINAL LYSIS ADHESIONS  fall 2017   [facility]  . CHOLECYSTECTOMY OPEN  1971   and Appendectomy  . CORONARY ANGIOPLASTY WITH STENT PLACEMENT  04-17-2013  in [place]   x2 stents to RCA  . INGUINAL HERNIA REPAIR Bilateral 1995  . TOTAL HIP ARTHROPLASTY Left 2008  . TRANSURETHRAL RESECTION OF BLADDER TUMOR  09/2006   LEIOMYOMA   . TRANSURETHRAL RESECTION OF BLADDER TUMOR WITH MITOMYCIN-C N/A 09/02/2016   Procedure: TRANSURETHRAL RESECTION OF BLADDER TUMOR WITH MITOMYCIN-C;  Surgeon: Kathie Rhodes, MD;  Location: [facility];  Service: Urology;  Laterality: N/A;      SOCIAL HISTORY:  Social History  Substance Use Topics  . Smoking status: Never Smoker  . Smokeless tobacco: Never Used  . Alcohol use Yes     Comment: occasional      FAMILY HISTORY: Family History   Medical History Relation Name Comments  Myocardial Infarction (Heart attack) Father    Cancer Mother  Gallbladder cancer  Macular degeneration Sister    COPD Sister    Heart failure Sister        MEDICATIONS AT HOME: Prior to Admission medications     Medication Sig Start Date End Date Taking? Authorizing Provider  aspirin EC 81 MG tablet Take 81 mg by mouth daily.   Yes Historical Provider, MD  Black Pepper-Turmeric 3-500 MG CAPS Take 2 capsules by mouth daily.   Yes Historical Provider, MD  Chromium-Cinnamon (CINNAMON PLUS CHROMIUM PO) Take 1 tablet by mouth 2 (two) times daily. 1000mg  cinnamon/  250mg  chromium   Yes Historical Provider, MD  finasteride (PROSCAR) 5 MG tablet Take 5 mg by mouth every evening.    Yes Historical Provider, MD  hydrochlorothiazide (HYDRODIURIL) 25 MG tablet Take 12.5 mg by mouth every morning.    Yes Historical Provider, MD  losartan (COZAAR) 50 MG tablet Take 50 mg by mouth every evening.    Yes Historical Provider, MD  metoprolol tartrate (LOPRESSOR) 25 MG tablet Take 50 mg by mouth every evening.    Yes Historical Provider, MD  Multiple Vitamin (MULTIVITAMIN WITH MINERALS) TABS tablet Take 1 tablet by mouth daily.   Yes Historical Provider, MD  Omega-3 Fatty Acids (SUPER OMEGA 3 EPA/DHA) 1000 MG CAPS Take 1 capsule by mouth daily. Omega 3 - DHA - EPA - Fish oil-- 1000mg / 120mg / 180mg    Yes Historical Provider, MD  pantoprazole (PROTONIX) 40 MG tablet Take 40 mg by mouth every evening.    Yes Historical Provider, MD  tamsulosin (FLOMAX) 0.4 MG CAPS capsule Take 0.4 mg by mouth every evening.    Yes Historical Provider, MD  phenazopyridine (PYRIDIUM) 200 MG tablet Take 1 tablet (200 mg total) by mouth 3 (three) times daily as needed for pain. 09/02/16   Kathie Rhodes, MD      DRUG ALLERGIES: No Known Allergies   REVIEW OF SYSTEMS: CONSTITUTIONAL: No fatigue, weakness, fever, chills, weight gain/loss, headache EYES: No blurry or double vision. ENT: No tinnitus, postnasal drip, redness or soreness of the oropharynx. RESPIRATORY: No cough, wheeze, hemoptysis.Positive dyspnea CARDIOVASCULAR: Positive chest pain, negative orthopnea, palpitations, syncope. GASTROINTESTINAL: No nausea, vomiting, constipation,  diarrhea, abdominal pain. No hematemesis, melena or hematochezia. GENITOURINARY: No dysuria, frequency, hematuria. ENDOCRINE: No polyuria or nocturia. No heat or cold intolerance. HEMATOLOGY: No anemia, bruising, bleeding. INTEGUMENTARY: No rashes, ulcers, lesions. MUSCULOSKELETAL: No pain, arthritis, swelling, gout. NEUROLOGIC: No numbness, tingling, weakness or ataxia. No seizure-type activity. PSYCHIATRIC: No anxiety, depression, insomnia.  PHYSICAL EXAMINATION: VITAL SIGNS: Blood pressure (!) 143/69, pulse (!) 105, temperature 98.4 F (36.9 C), temperature source Oral, height 5\' 10"  (1.778 m), weight 90.7 kg (200 lb), SpO2 95 %.  GENERAL: 83 y.o.-year-old white male patient, well-developed, well-nourished lying in the bed in no acute distress.  Pleasant and cooperative.   HEENT: Head atraumatic, normocephalic. Pupils equal, round, reactive to light and accommodation. No scleral icterus. Extraocular muscles intact. Oropharynx is clear. Mucus membranes moist. NECK: Supple, full range of motion. No JVD, no bruit heard. No cervical lymphadenopathy. CHEST: Normal breath sounds bilaterally. No wheezing, rales, rhonchi or crackles. No use of accessory muscles of respiration.  No reproducible chest wall  tenderness.  CARDIOVASCULAR: S1, S2 normal. No murmurs, rubs, or gallops appreciated. Cap refill <2 seconds. ABDOMEN: Soft, nontender, nondistended. No rebound, guarding, rigidity. Normoactive bowel sounds present in all four quadrants. No organomegaly or mass. EXTREMITIES: Full range of motion. No pedal edema, cyanosis, or clubbing. NEUROLOGIC: Cranial nerves II through XII are grossly intact with no focal sensorimotor deficit. Muscle strength 5/5 in all extremities. Sensation intact. Gait not checked. PSYCHIATRIC: The patient is alert and oriented x 3. Normal affect, mood, thought content. SKIN: Warm, dry, and intact without obvious rash, lesion, or ulcer.  LABORATORY PANEL:  CBC  Recent  Labs Lab 10/07/16 1636  WBC 9.5  HGB 13.9  HCT 40.8  PLT 206   ----------------------------------------------------------------------------------------------------------------- Chemistries  Recent Labs Lab 10/07/16 1636  NA 134*  K 3.3*  CL 99*  CO2 28  GLUCOSE 187*  BUN 17  CREATININE 0.88  CALCIUM 8.9   ------------------------------------------------------------------------------------------------------------------ Cardiac Enzymes  Recent Labs Lab 10/07/16 1636  TROPONINI <0.03   ------------------------------------------------------------------------------------------------------------------  RADIOLOGY: Dg Chest 2 View  Result Date: 10/07/2016 CLINICAL DATA:  Chest pressure and shortness of breath EXAM: CHEST  2 VIEW COMPARISON:  None. FINDINGS: There is no edema or consolidation. The heart size and pulmonary vascularity are normal. No adenopathy. There is aortic atherosclerosis. There is degenerative change in the thoracic spine. IMPRESSION: No edema or consolidation.  There is aortic atherosclerosis. Electronically Signed   By: Lowella Grip III M.D.   On: 10/07/2016 17:11   US Venous Img Lower Bilateral  Result Date: 10/07/2016 CLINICAL DATA:  Elevated D-dimer. Assess for underlying deep venous thrombosis. Initial encounter. EXAM: BILATERAL LOWER EXTREMITY VENOUS DOPPLER ULTRASOUND TECHNIQUE: Gray-scale sonography with graded compression, as well as color Doppler and duplex ultrasound were performed to evaluate the lower extremity deep venous systems from the level of the common femoral vein and including the common femoral, femoral, profunda femoral, popliteal and calf veins including the posterior tibial, peroneal and gastrocnemius veins when visible. The superficial great saphenous vein was also interrogated. Spectral Doppler was utilized to evaluate flow at rest and with distal augmentation maneuvers in the common femoral, femoral and popliteal veins.  COMPARISON:  None. FINDINGS: RIGHT LOWER EXTREMITY Common Femoral Vein: No evidence of thrombus. Normal compressibility, respiratory phasicity and response to augmentation. Saphenofemoral Junction: No evidence of thrombus. Normal compressibility and flow on color Doppler imaging. Profunda Femoral Vein: No evidence of thrombus. Normal compressibility and flow on color Doppler imaging. Femoral Vein: No evidence of thrombus. Normal compressibility, respiratory phasicity and response to augmentation. Popliteal Vein: No evidence of thrombus. Normal compressibility, respiratory phasicity and response to augmentation. Calf Veins: No evidence of thrombus. Normal compressibility and flow on color Doppler imaging. Superficial Great Saphenous Vein: No evidence of thrombus. Normal compressibility and flow on color Doppler imaging. Venous Reflux:  None. Other Findings: A mildly complex lobulated Baker's cyst is noted at the right popliteal fossa, measuring 5.9 x 3.5 x 4.7 cm. LEFT LOWER EXTREMITY Common Femoral Vein: No evidence of thrombus. Normal compressibility, respiratory phasicity and response to augmentation. Saphenofemoral Junction: No evidence of thrombus. Normal compressibility and flow on color Doppler imaging. Profunda Femoral Vein: No evidence of thrombus. Normal compressibility and flow on color Doppler imaging. Femoral Vein: No evidence of thrombus. Normal compressibility, respiratory phasicity and response to augmentation. Popliteal Vein: No evidence of thrombus. Normal compressibility, respiratory phasicity and response to augmentation. Calf Veins: No evidence of thrombus. Normal compressibility and flow on color Doppler imaging. Superficial Great Saphenous Vein: No evidence of  thrombus. Normal compressibility and flow on color Doppler imaging. Venous Reflux:  None. Other Findings:  None. IMPRESSION: 1. No evidence of deep venous thrombosis. 2. Mildly complex lobulated Baker's cyst at the right popliteal fossa,  measuring 5.9 x 3.5 x 4.7 cm. Electronically Signed   By: Garald Balding M.D.   On: 10/07/2016 21:09   EKG: Sinus tachycardia 105 bpm with normal axis and nonspecific ST-T wave changes.   IMPRESSION AND PLAN:  This is a 83 y.o. male with a history of OA, bladder cancer, BPH, coronary artery disease status post PCI with stents, hypertension, hyperlipidemia, GERD, diabetes now being admitted with:  1. Chest pain, rule out ACS - Admit to observation with telemetry monitoring. - Trend troponins, check lipids and TSH. - Morphine, nitro, beta blocker, aspirin and statin ordered.   - Check echo - Cardiology consult requested.  - Metoprolol, Cozaar  2. Hypokalemia,  Mild - Replace PO  3. 4cm thoracic aortic aneurysm - Monitor yearly per rads recommendation  4. HTN - HCTZ, metoprolol, Cozaar  5. GERD - Protonix  6. BPH - Flomax, Proscar  7. DM, diet controlled - Accuchecks q4 with RISS coverage  Admission status: Observation, telemetry Diet/Nutrition: NPO Fluids: HL DVT Px: SCDs and early ambulation Code Status: Full Disposition Plan: To home in <24 hours  All the records are reviewed and case discussed with ED provider. Management plans discussed with the patient and/or family who express understanding and agree with plan of care.   TOTAL TIME TAKING CARE OF THIS PATIENT: 60 minutes.   Shanica Castellanos D.O. on 10/07/2016 at 10:16 PM Between 7am to 6pm - Pager - [phone] After 6pm go to [email or web address] - [employer]reader [employer] [facility] Office [phone] CC: Primary care physician; Dion Body, MD     Note: This dictation was prepared with Dragon dictation along with smaller phrase technology. Any transcriptional errors that result from this process are unintentional.

## 2016-10-07 NOTE — ED Notes (Signed)
Pt assisted to toilet, ambulated with standby assist

## 2016-10-07 NOTE — ED Notes (Signed)
Daughter, Vinnie Level to be reached at [phone]

## 2016-10-08 ENCOUNTER — Observation Stay
Admit: 2016-10-08 | Discharge: 2016-10-08 | Disposition: A | Discharge status: Home / Self Care | Payer: Medicare Other | Attending: Family Medicine | Admitting: Family Medicine

## 2016-10-08 DIAGNOSIS — R0789 Other chest pain: Secondary | ICD-10-CM | POA: Diagnosis not present

## 2016-10-08 LAB — GLUCOSE, CAPILLARY
GLUCOSE-CAPILLARY: 159 mg/dL — AB (ref 65–99)
Glucose-Capillary: 152 mg/dL — ABNORMAL HIGH (ref 65–99)
Glucose-Capillary: 135 mg/dL — ABNORMAL HIGH (ref 65–99)

## 2016-10-08 LAB — TROPONIN I: Troponin I: <0.03 ng/mL (ref <0.03)

## 2016-10-08 MED ORDER — FINASTERIDE 5 MG PO TABS
5.0000 mg | ORAL_TABLET | Freq: Every evening | ORAL | Status: DC
  Administered 2016-10-08: 5 mg via ORAL
  Filled 2016-10-08: qty 1

## 2016-10-08 MED ORDER — INSULIN ASPART 100 UNIT/ML ~~LOC~~ SOLN
0.0000 [IU] | SUBCUTANEOUS | Status: DC
  Administered 2016-10-08: 3 [IU] via SUBCUTANEOUS
  Filled 2016-10-08 (×2): qty 3

## 2016-10-08 MED ORDER — OMEGA-3-ACID ETHYL ESTERS 1 G PO CAPS
1.0000 g | ORAL_CAPSULE | Freq: Every day | ORAL | Status: DC
  Administered 2016-10-08: 1 g via ORAL
  Filled 2016-10-08: qty 1

## 2016-10-08 MED ORDER — TAMSULOSIN HCL 0.4 MG PO CAPS
0.4000 mg | ORAL_CAPSULE | Freq: Every evening | ORAL | Status: DC
  Administered 2016-10-08: 0.4 mg via ORAL
  Filled 2016-10-08: qty 1

## 2016-10-08 NOTE — Progress Notes (Signed)
Patient discharged via wheelchair and private vehicle. IV removed and catheter intact. All discharge instructions given and patient verbalizes understanding. Tele removed and returned. No prescriptions given to patient No distress noted.   

## 2016-10-08 NOTE — Care Management Obs Status (Signed)
[place] NOTIFICATION   Patient Details  Name: Jamahl Lemmons MRN: [id number] Date of Birth: Feb 08, 1921   Medicare Observation Status Notification Given:  No (In Observation status less than 24 hours prior to discharge. No MOON letter given)    Mardene Speak, RN 10/08/2016, 12:45 PM

## 2016-10-08 NOTE — Discharge Summary (Signed)
[place] [facility] [place] NAME: Tyler Pena    MR#:  [id number]  DATE OF BIRTH:  [birth year]  DATE OF ADMISSION:  10/07/2016 ADMITTING PHYSICIAN: Harvie Bridge, DO  DATE OF DISCHARGE: 10/08/2016  PRIMARY CARE PHYSICIAN: Dion Body, MD    ADMISSION DIAGNOSIS:  Shortness of breath [R06.02] Weakness [R53.1]  DISCHARGE DIAGNOSIS:  Active Problems:   Chest pain, rule out acute myocardial infarction  SECONDARY DIAGNOSIS:   Past Medical History:  Diagnosis Date  . Arthritis   . Bilateral renal cysts   . Bladder cancer [facility])    urologist-  dr Karsten Ro  . BPH (benign prostatic hyperplasia)   . Coronary artery disease    hx PCI w/ stenting in 10/ 2014 in [place]--- recently moved from [place] jan 2018 has not established a cardiologist yet but does have pcp   . Dyslipidemia   . Essential hypertension   . GERD (gastroesophageal reflux disease)   . Hematuria   . History of gastric ulcer   . History of hiatal hernia   . History of kidney stones   . History of melanoma in situ    several Excision's in situ and malignant melanoma's--  last one 06/ 2017 top of ear excision Stage 1 w/ negative margins/  other have been right upper arm, shoulder, chest, face  . History of MI (myocardial infarction)    10/ 2014-- s/p  PCI and stenting x2  . History of small bowel obstruction    fall 2017  s/p  abdominal lysis adhesions  (prior hx bowel obstruction total 8 times)  . Nocturia   . S/P right coronary artery (RCA) stent placement    10/ 2014  x2  . Type 2 diabetes, diet controlled ([place])   . Wears glasses   . Wears hearing aid    bilateral    HOSPITAL COURSE:   Pt came with episode of chest tightness, which is resolved, he is able to walk in and out of his room without any symptoms. Feels completely back to baseline. 3 troponins remained negative. Echocardiogram is done. He have a PMD and is agreeing to set up out pt  appointment with cardiologist with help of PMD. Which is reasonable in abscence of any symptoms, negative troponin, and No EKG changes now.  DISCHARGE CONDITIONS:   Stable.  CONSULTS OBTAINED:    DRUG ALLERGIES:  No Known Allergies  DISCHARGE MEDICATIONS:   Current Discharge Medication List    CONTINUE these medications which have NOT CHANGED   Details  aspirin EC 81 MG tablet Take 81 mg by mouth daily.    Black Pepper-Turmeric 3-500 MG CAPS Take 2 capsules by mouth daily.    Chromium-Cinnamon (CINNAMON PLUS CHROMIUM PO) Take 1 tablet by mouth 2 (two) times daily. 1000mg  cinnamon/  250mg  chromium    finasteride (PROSCAR) 5 MG tablet Take 5 mg by mouth every evening.     hydrochlorothiazide (HYDRODIURIL) 25 MG tablet Take 12.5 mg by mouth every morning.     losartan (COZAAR) 50 MG tablet Take 50 mg by mouth every evening.     metoprolol tartrate (LOPRESSOR) 25 MG tablet Take 50 mg by mouth every evening.     Multiple Vitamin (MULTIVITAMIN WITH MINERALS) TABS tablet Take 1 tablet by mouth daily.    Omega-3 Fatty Acids (SUPER OMEGA 3 EPA/DHA) 1000 MG CAPS Take 1 capsule by mouth daily. Omega 3 - DHA - EPA - Fish oil-- 1000mg / 120mg / 180mg   pantoprazole (PROTONIX) 40 MG tablet Take 40 mg by mouth every evening.     tamsulosin (FLOMAX) 0.4 MG CAPS capsule Take 0.4 mg by mouth every evening.     phenazopyridine (PYRIDIUM) 200 MG tablet Take 1 tablet (200 mg total) by mouth 3 (three) times daily as needed for pain. Qty: 16 tablet, Refills: 0         DISCHARGE INSTRUCTIONS:    Follow with PMD in 1 week.  If you experience worsening of your admission symptoms, develop shortness of breath, life threatening emergency, suicidal or homicidal thoughts you must seek medical attention immediately by calling 911 or calling your MD immediately  if symptoms less severe.  You Must read complete instructions/literature along with all the possible adverse reactions/side effects for  all the Medicines you take and that have been prescribed to you. Take any new Medicines after you have completely understood and accept all the possible adverse reactions/side effects.   Please note  You were cared for by a hospitalist during your hospital stay. If you have any questions about your discharge medications or the care you received while you were in the hospital after you are discharged, you can call the unit and asked to speak with the hospitalist on call if the hospitalist that took care of you is not available. Once you are discharged, your primary care physician will handle any further medical issues. Please note that NO REFILLS for any discharge medications will be authorized once you are discharged, as it is imperative that you return to your primary care physician (or establish a relationship with a primary care physician if you do not have one) for your aftercare needs so that they can reassess your need for medications and monitor your lab values.    Today   CHIEF COMPLAINT:  No chief complaint on file.   HISTORY OF PRESENT ILLNESS:  Tyler Pena  is a 83 y.o. male with a known history of OA, bladder cancer, BPH, coronary artery disease status post PCI with stents, hypertension, hyperlipidemia, GERD, diabetes presents to the emergency department for evaluation of chest pain.  Patient was in a usual state of health until last night when he describes a sudden onset of chest tightness associated with a gasping for air while he was vacuuming. He denies any associated diaphoresis, shortness of breath, nausea. He states he had similar symptoms when he had a posterior MI in 2014. His symptoms have resolved.  Patient is independent with excellent functional capacity. He is a former Engineer, drilling.  Otherwise there has been no change in status. Patient has been taking medication as prescribed and there has been no recent change in medication or diet.  There has been no recent illness,  travel or sick contacts.    Patient denies fevers/chills, weakness, dizziness, N/V/C/D, abdominal pain, dysuria/frequency, changes in mental status.    VITAL SIGNS:  Blood pressure (!) 138/55, pulse 72, temperature 99.2 F (37.3 C), temperature source Oral, resp. rate 18, height 5\' 10"  (1.778 m), weight 90 kg (198 lb 6.4 oz), SpO2 96 %.  I/O:   Intake/Output Summary (Last 24 hours) at 10/08/16 1148 Last data filed at 10/08/16 0942  Gross per 24 hour  Intake             1185 ml  Output              500 ml  Net              685 ml  PHYSICAL EXAMINATION:  GENERAL:  83 y.o.-year-old patient lying in the bed with no acute distress.  EYES: Pupils equal, round, reactive to light and accommodation. No scleral icterus. Extraocular muscles intact.  HEENT: Head atraumatic, normocephalic. Oropharynx and nasopharynx clear.  NECK:  Supple, no jugular venous distention. No thyroid enlargement, no tenderness.  LUNGS: Normal breath sounds bilaterally, no wheezing, rales,rhonchi or crepitation. No use of accessory muscles of respiration.  CARDIOVASCULAR: S1, S2 normal. systolic murmurs, no rubs, or gallops.  ABDOMEN: Soft, non-tender, non-distended. Bowel sounds present. No organomegaly or mass.  EXTREMITIES: No pedal edema, cyanosis, or clubbing.  NEUROLOGIC: Cranial nerves II through XII are intact. Muscle strength 5/5 in all extremities. Sensation intact. Gait not checked.  PSYCHIATRIC: The patient is alert and oriented x 3.  SKIN: No obvious rash, lesion, or ulcer.   DATA REVIEW:   CBC  Recent Labs Lab 10/07/16 1636  WBC 9.5  HGB 13.9  HCT 40.8  PLT 206    Chemistries   Recent Labs Lab 10/07/16 1636  NA 134*  K 3.3*  CL 99*  CO2 28  GLUCOSE 187*  BUN 17  CREATININE 0.88  CALCIUM 8.9    Cardiac Enzymes  Recent Labs Lab 10/08/16 0532  TROPONINI <0.03    Microbiology Results  No results found for this or any previous visit.  RADIOLOGY:  Dg Chest 2  View  Result Date: 10/07/2016 CLINICAL DATA:  Chest pressure and shortness of breath EXAM: CHEST  2 VIEW COMPARISON:  None. FINDINGS: There is no edema or consolidation. The heart size and pulmonary vascularity are normal. No adenopathy. There is aortic atherosclerosis. There is degenerative change in the thoracic spine. IMPRESSION: No edema or consolidation.  There is aortic atherosclerosis. Electronically Signed   By: Lowella Grip III M.D.   On: 10/07/2016 17:11   Ct Angio Chest Pe W And/or Wo Contrast  Result Date: 10/07/2016 CLINICAL DATA:  Chest pressure, dyspnea and generalized weakness. EXAM: CT ANGIOGRAPHY CHEST WITH CONTRAST TECHNIQUE: Multidetector CT imaging of the chest was performed using the standard protocol during bolus administration of intravenous contrast. Multiplanar CT image reconstructions and MIPs were obtained to evaluate the vascular anatomy. CONTRAST:  75 cc of Isovue 370 IV COMPARISON:  CXR from earlier on the same day. FINDINGS: Cardiovascular: Normal branch pattern of the great vessels. Aortic atherosclerosis without dissection. 4 cm ascending aortic aneurysm. No acute pulmonary embolus. Mediastinum/Nodes: Small hiatal hernia. Unremarkable CT appearance of the esophagus. No lymphadenopathy. Patent trachea and mainstem bronchi. No thyromegaly or mass. Lungs/Pleura: No pulmonary consolidation, effusion or pneumothorax. Minimal bibasilar dependent atelectasis. Upper Abdomen: No acute abnormality. Musculoskeletal: No chest wall abnormality. No acute or significant osseous findings. Mild degenerative change along the thoracolumbar spine. Review of the MIP images confirms the above findings. IMPRESSION: 1. 4 cm ascending thoracic aortic aneurysm without dissection. Recommend annual imaging followup by CTA or MRA. This recommendation follows 2010 ACCF/AHA/AATS/ACR/ASA/SCA/SCAI/SIR/STS/SVM Guidelines for the Diagnosis and Management of Patients with Thoracic Aortic Disease.  Circulation. 2010; 121: W098-J191 2. No acute pulmonary embolus.  Lump 3. Dependent atelectasis of the lungs. No acute pneumonic consolidation, effusion or pneumothorax. Electronically Signed   By: Ashley Royalty M.D.   On: 10/07/2016 22:14   US Venous Img Lower Bilateral  Result Date: 10/07/2016 CLINICAL DATA:  Elevated D-dimer. Assess for underlying deep venous thrombosis. Initial encounter. EXAM: BILATERAL LOWER EXTREMITY VENOUS DOPPLER ULTRASOUND TECHNIQUE: Gray-scale sonography with graded compression, as well as color Doppler and duplex ultrasound were performed to evaluate  the lower extremity deep venous systems from the level of the common femoral vein and including the common femoral, femoral, profunda femoral, popliteal and calf veins including the posterior tibial, peroneal and gastrocnemius veins when visible. The superficial great saphenous vein was also interrogated. Spectral Doppler was utilized to evaluate flow at rest and with distal augmentation maneuvers in the common femoral, femoral and popliteal veins. COMPARISON:  None. FINDINGS: RIGHT LOWER EXTREMITY Common Femoral Vein: No evidence of thrombus. Normal compressibility, respiratory phasicity and response to augmentation. Saphenofemoral Junction: No evidence of thrombus. Normal compressibility and flow on color Doppler imaging. Profunda Femoral Vein: No evidence of thrombus. Normal compressibility and flow on color Doppler imaging. Femoral Vein: No evidence of thrombus. Normal compressibility, respiratory phasicity and response to augmentation. Popliteal Vein: No evidence of thrombus. Normal compressibility, respiratory phasicity and response to augmentation. Calf Veins: No evidence of thrombus. Normal compressibility and flow on color Doppler imaging. Superficial Great Saphenous Vein: No evidence of thrombus. Normal compressibility and flow on color Doppler imaging. Venous Reflux:  None. Other Findings: A mildly complex lobulated Baker's cyst  is noted at the right popliteal fossa, measuring 5.9 x 3.5 x 4.7 cm. LEFT LOWER EXTREMITY Common Femoral Vein: No evidence of thrombus. Normal compressibility, respiratory phasicity and response to augmentation. Saphenofemoral Junction: No evidence of thrombus. Normal compressibility and flow on color Doppler imaging. Profunda Femoral Vein: No evidence of thrombus. Normal compressibility and flow on color Doppler imaging. Femoral Vein: No evidence of thrombus. Normal compressibility, respiratory phasicity and response to augmentation. Popliteal Vein: No evidence of thrombus. Normal compressibility, respiratory phasicity and response to augmentation. Calf Veins: No evidence of thrombus. Normal compressibility and flow on color Doppler imaging. Superficial Great Saphenous Vein: No evidence of thrombus. Normal compressibility and flow on color Doppler imaging. Venous Reflux:  None. Other Findings:  None. IMPRESSION: 1. No evidence of deep venous thrombosis. 2. Mildly complex lobulated Baker's cyst at the right popliteal fossa, measuring 5.9 x 3.5 x 4.7 cm. Electronically Signed   By: Garald Balding M.D.   On: 10/07/2016 21:09    EKG:   Orders placed or performed during the hospital encounter of 10/07/16  . ED EKG within 10 minutes  . ED EKG within 10 minutes  . EKG 12-Lead  . EKG 12-Lead  . EKG 12-Lead (at 6am)  . EKG 12-Lead (Repeat cardiac markers, recurrent chest pain)  . EKG 12-Lead (at 6am)  . EKG 12-Lead (Repeat cardiac markers, recurrent chest pain)      Management plans discussed with the patient, family and they are in agreement.  CODE STATUS:     Code Status Orders        Start     Ordered   10/07/16 2350  Full code  Continuous     10/07/16 2350    Code Status History    Date Active Date Inactive Code Status Order ID Comments User Context   This patient has a current code status but no historical code status.    Advance Directive Documentation     Most Recent Value  Type  of Advance Directive  Healthcare Power of Attorney, Living will  Pre-existing out of facility DNR order (yellow form or pink MOST form)  -  "MOST" Form in Place?  -      TOTAL TIME TAKING CARE OF THIS PATIENT: 35 minutes.    Vaughan Basta M.D on 10/08/2016 at 11:48 AM  Between 7am to 6pm - Pager - [phone]  After 6pm go  to [email or web address] - password [email or web address] [facility]  Office  [phone]  CC: Primary care physician; Dion Body, MD   Note: This dictation was prepared with Dragon dictation along with smaller phrase technology. Any transcriptional errors that result from this process are unintentional.

## 2016-10-09 LAB — ECHOCARDIOGRAM COMPLETE
HEIGHTINCHES: 70 in
Weight: 3174.4 oz

## 2016-10-12 DIAGNOSIS — C801 Malignant (primary) neoplasm, unspecified: Secondary | ICD-10-CM | POA: Insufficient documentation

## 2016-10-28 ENCOUNTER — Emergency Department: Payer: Medicare Other

## 2016-10-28 ENCOUNTER — Encounter: Payer: Self-pay | Admitting: Emergency Medicine

## 2016-10-28 ENCOUNTER — Inpatient Hospital Stay
Admission: EM | Admit: 2016-10-28 | Discharge: 2016-10-30 | DRG: 389 | Disposition: A | Discharge status: Home / Self Care | Payer: Medicare Other | Attending: Internal Medicine | Admitting: Internal Medicine

## 2016-10-28 DIAGNOSIS — Z955 Presence of coronary angioplasty implant and graft: Secondary | ICD-10-CM | POA: Diagnosis not present

## 2016-10-28 DIAGNOSIS — Z66 Do not resuscitate: Secondary | ICD-10-CM | POA: Diagnosis present

## 2016-10-28 DIAGNOSIS — K56600 Partial intestinal obstruction, unspecified as to cause: Secondary | ICD-10-CM | POA: Diagnosis not present

## 2016-10-28 DIAGNOSIS — Z9049 Acquired absence of other specified parts of digestive tract: Secondary | ICD-10-CM

## 2016-10-28 DIAGNOSIS — K219 Gastro-esophageal reflux disease without esophagitis: Secondary | ICD-10-CM | POA: Diagnosis present

## 2016-10-28 DIAGNOSIS — Z8551 Personal history of malignant neoplasm of bladder: Secondary | ICD-10-CM | POA: Diagnosis not present

## 2016-10-28 DIAGNOSIS — E119 Type 2 diabetes mellitus without complications: Secondary | ICD-10-CM | POA: Diagnosis present

## 2016-10-28 DIAGNOSIS — I252 Old myocardial infarction: Secondary | ICD-10-CM

## 2016-10-28 DIAGNOSIS — R3912 Poor urinary stream: Secondary | ICD-10-CM | POA: Diagnosis present

## 2016-10-28 DIAGNOSIS — Z96642 Presence of left artificial hip joint: Secondary | ICD-10-CM | POA: Diagnosis present

## 2016-10-28 DIAGNOSIS — N3001 Acute cystitis with hematuria: Secondary | ICD-10-CM | POA: Diagnosis present

## 2016-10-28 DIAGNOSIS — Z85820 Personal history of malignant melanoma of skin: Secondary | ICD-10-CM

## 2016-10-28 DIAGNOSIS — K5651 Intestinal adhesions [bands], with partial obstruction: Principal | ICD-10-CM | POA: Diagnosis present

## 2016-10-28 DIAGNOSIS — I2510 Atherosclerotic heart disease of native coronary artery without angina pectoris: Secondary | ICD-10-CM | POA: Diagnosis present

## 2016-10-28 DIAGNOSIS — H9190 Unspecified hearing loss, unspecified ear: Secondary | ICD-10-CM | POA: Diagnosis present

## 2016-10-28 DIAGNOSIS — E785 Hyperlipidemia, unspecified: Secondary | ICD-10-CM | POA: Diagnosis present

## 2016-10-28 DIAGNOSIS — N401 Enlarged prostate with lower urinary tract symptoms: Secondary | ICD-10-CM | POA: Diagnosis present

## 2016-10-28 DIAGNOSIS — I10 Essential (primary) hypertension: Secondary | ICD-10-CM | POA: Diagnosis present

## 2016-10-28 DIAGNOSIS — R109 Unspecified abdominal pain: Secondary | ICD-10-CM | POA: Diagnosis present

## 2016-10-28 DIAGNOSIS — Z8711 Personal history of peptic ulcer disease: Secondary | ICD-10-CM

## 2016-10-28 DIAGNOSIS — I959 Hypotension, unspecified: Secondary | ICD-10-CM | POA: Diagnosis present

## 2016-10-28 DIAGNOSIS — Z974 Presence of external hearing-aid: Secondary | ICD-10-CM | POA: Diagnosis not present

## 2016-10-28 DIAGNOSIS — Z7982 Long term (current) use of aspirin: Secondary | ICD-10-CM

## 2016-10-28 DIAGNOSIS — E876 Hypokalemia: Secondary | ICD-10-CM | POA: Diagnosis present

## 2016-10-28 LAB — CBC WITH DIFFERENTIAL/PLATELET
BASOS ABS: 0 10*3/uL (ref 0–0.1)
Basophils Relative: 0 %
Eosinophils Absolute: 0.1 10*3/uL (ref 0–0.7)
Eosinophils Relative: 0 %
HEMATOCRIT: 39.9 % — AB (ref 40.0–52.0)
HEMOGLOBIN: 13.8 g/dL (ref 13.0–18.0)
Lymphocytes Relative: 19 %
Lymphs Abs: 2.6 10*3/uL (ref 1.0–3.6)
MCH: 29.6 pg (ref 26.0–34.0)
MCHC: 34.6 g/dL (ref 32.0–36.0)
MCV: 85.5 fL (ref 80.0–100.0)
Monocytes Absolute: 0.7 10*3/uL (ref 0.2–1.0)
Monocytes Relative: 5 %
Neutro Abs: 10.3 10*3/uL — ABNORMAL HIGH (ref 1.4–6.5)
Neutrophils Relative %: 76 %
Platelets: 268 10*3/uL (ref 150–440)
RBC: 4.67 MIL/uL (ref 4.40–5.90)
RDW: 14.7 % — ABNORMAL HIGH (ref 11.5–14.5)
WBC: 13.8 10*3/uL — ABNORMAL HIGH (ref 3.8–10.6)

## 2016-10-28 LAB — PROTIME-INR
INR: 0.95
Prothrombin Time: 12.7 seconds (ref 11.4–15.2)

## 2016-10-28 LAB — URINALYSIS, COMPLETE (UACMP) WITH MICROSCOPIC
BACTERIA UA: NONE SEEN
BILIRUBIN URINE: NEGATIVE
Glucose, UA: 50 mg/dL — AB
KETONES UR: NEGATIVE mg/dL
Nitrite: NEGATIVE
PH: 5 (ref 5.0–8.0)
PROTEIN: NEGATIVE mg/dL
SQUAMOUS EPITHELIAL / LPF: NONE SEEN
Specific Gravity, Urine: >1.046 — ABNORMAL HIGH (ref 1.005–1.030)

## 2016-10-28 LAB — COMPREHENSIVE METABOLIC PANEL
ALBUMIN: 4.1 g/dL (ref 3.5–5.0)
ALK PHOS: 68 U/L (ref 38–126)
ALT: 17 U/L (ref 17–63)
ANION GAP: 9 (ref 5–15)
AST: 32 U/L (ref 15–41)
BUN: 22 mg/dL — AB (ref 6–20)
CALCIUM: 9.3 mg/dL (ref 8.9–10.3)
CO2: 26 mmol/L (ref 22–32)
CREATININE: 0.92 mg/dL (ref 0.61–1.24)
Chloride: 101 mmol/L (ref 101–111)
GFR calc Af Amer: >60 mL/min (ref >60)
GFR calc non Af Amer: >60 mL/min (ref >60)
GLUCOSE: 232 mg/dL — AB (ref 65–99)
Potassium: 3.6 mmol/L (ref 3.5–5.1)
SODIUM: 136 mmol/L (ref 135–145)
Total Bilirubin: 0.8 mg/dL (ref 0.3–1.2)
Total Protein: 7.6 g/dL (ref 6.5–8.1)

## 2016-10-28 LAB — GLUCOSE, CAPILLARY
GLUCOSE-CAPILLARY: 152 mg/dL — AB (ref 65–99)
Glucose-Capillary: 156 mg/dL — ABNORMAL HIGH (ref 65–99)
Glucose-Capillary: 106 mg/dL — ABNORMAL HIGH (ref 65–99)

## 2016-10-28 LAB — LACTIC ACID, PLASMA: Lactic Acid, Venous: 1.6 mmol/L (ref 0.5–1.9)

## 2016-10-28 MED ORDER — MORPHINE SULFATE (PF) 2 MG/ML IV SOLN: 2.0000 mg | INTRAVENOUS | Status: DC | PRN

## 2016-10-28 MED ORDER — ONDANSETRON HCL 4 MG/2ML IJ SOLN
4.0000 mg | Freq: Once | INTRAMUSCULAR | Status: AC
  Administered 2016-10-28: 4 mg via INTRAVENOUS
  Filled 2016-10-28: qty 2

## 2016-10-28 MED ORDER — IOPAMIDOL (ISOVUE-300) INJECTION 61%: 30.0000 mL | Freq: Once | INTRAVENOUS | Status: DC | PRN

## 2016-10-28 MED ORDER — ACETAMINOPHEN 325 MG PO TABS: 650.0000 mg | ORAL_TABLET | Freq: Four times a day (QID) | ORAL | Status: DC | PRN

## 2016-10-28 MED ORDER — PANTOPRAZOLE SODIUM 40 MG IV SOLR
40.0000 mg | INTRAVENOUS | Status: DC
Start: 2016-10-28 — End: 2016-10-30
  Administered 2016-10-28 – 2016-10-29 (×2): 40 mg via INTRAVENOUS
  Filled 2016-10-28 (×2): qty 40

## 2016-10-28 MED ORDER — INSULIN ASPART 100 UNIT/ML ~~LOC~~ SOLN
0.0000 [IU] | Freq: Three times a day (TID) | SUBCUTANEOUS | Status: DC
  Administered 2016-10-28: 2 [IU] via SUBCUTANEOUS
  Administered 2016-10-29 – 2016-10-30 (×4): 1 [IU] via SUBCUTANEOUS
  Filled 2016-10-28: qty 2
  Filled 2016-10-28 (×4): qty 1

## 2016-10-28 MED ORDER — INSULIN ASPART 100 UNIT/ML ~~LOC~~ SOLN: 0.0000 [IU] | Freq: Every day | SUBCUTANEOUS | Status: DC

## 2016-10-28 MED ORDER — FINASTERIDE 5 MG PO TABS
5.0000 mg | ORAL_TABLET | Freq: Every evening | ORAL | Status: DC
Start: 2016-10-29 — End: 2016-10-30
  Administered 2016-10-29: 5 mg via ORAL
  Filled 2016-10-28: qty 1

## 2016-10-28 MED ORDER — CIPROFLOXACIN IN D5W 400 MG/200ML IV SOLN
400.0000 mg | Freq: Two times a day (BID) | INTRAVENOUS | Status: DC
  Administered 2016-10-28 – 2016-10-30 (×4): 400 mg via INTRAVENOUS
  Filled 2016-10-28 (×5): qty 200

## 2016-10-28 MED ORDER — CIPROFLOXACIN IN D5W 400 MG/200ML IV SOLN: 400.0000 mg | Freq: Once | INTRAVENOUS | Status: DC

## 2016-10-28 MED ORDER — ENOXAPARIN SODIUM 40 MG/0.4ML ~~LOC~~ SOLN
40.0000 mg | SUBCUTANEOUS | Status: DC
  Administered 2016-10-28 – 2016-10-29 (×2): 40 mg via SUBCUTANEOUS
  Filled 2016-10-28 (×2): qty 0.4

## 2016-10-28 MED ORDER — ACETAMINOPHEN 650 MG RE SUPP: 650.0000 mg | Freq: Four times a day (QID) | RECTAL | Status: DC | PRN

## 2016-10-28 MED ORDER — IOPAMIDOL (ISOVUE-370) INJECTION 76%
125.0000 mL | Freq: Once | INTRAVENOUS | Status: AC | PRN
  Administered 2016-10-28: 125 mL via INTRAVENOUS

## 2016-10-28 MED ORDER — TAMSULOSIN HCL 0.4 MG PO CAPS
0.4000 mg | ORAL_CAPSULE | Freq: Every evening | ORAL | Status: DC
  Administered 2016-10-28 – 2016-10-29 (×2): 0.4 mg via ORAL
  Filled 2016-10-28 (×2): qty 1

## 2016-10-28 MED ORDER — POTASSIUM CHLORIDE IN NACL 20-0.9 MEQ/L-% IV SOLN
INTRAVENOUS | Status: DC
  Administered 2016-10-28 – 2016-10-29 (×2): via INTRAVENOUS
  Filled 2016-10-28 (×4): qty 1000

## 2016-10-28 MED ORDER — ONDANSETRON HCL 4 MG/2ML IJ SOLN: 4.0000 mg | Freq: Four times a day (QID) | INTRAMUSCULAR | Status: DC | PRN

## 2016-10-28 MED ORDER — MORPHINE SULFATE (PF) 4 MG/ML IV SOLN
INTRAVENOUS | Status: AC
  Administered 2016-10-28: 4 mg via INTRAVENOUS
  Filled 2016-10-28: qty 1

## 2016-10-28 MED ORDER — MORPHINE SULFATE (PF) 4 MG/ML IV SOLN
4.0000 mg | Freq: Once | INTRAVENOUS | Status: AC
  Administered 2016-10-28: 4 mg via INTRAVENOUS

## 2016-10-28 NOTE — ED Notes (Signed)
Pt resting in bed, eyes closed, resp even and unlabored

## 2016-10-28 NOTE — ED Notes (Signed)
Pt resting in bed, aware of pending admission, denies any needs, awake and alert in no acute distress, tolerating NG tube

## 2016-10-28 NOTE — ED Notes (Signed)
ED Provider at bedside. 

## 2016-10-28 NOTE — ED Notes (Signed)
Pt given ice chips, resting in bed, awake and alert

## 2016-10-28 NOTE — ED Triage Notes (Signed)
Pt to triage via [facility], report lower abd pain starting last night w/ nausea.  Hx recent UTI.  Pt hypotensive in triage, spo2 92%, skin cool and clammy.

## 2016-10-28 NOTE — ED Provider Notes (Signed)
[facility] Provider Note    First MD Initiated Contact with Patient 10/28/16 [phone]     (approximate)  I have reviewed the triage vital signs and the nursing notes.   HISTORY  Chief Complaint Urinary Tract Infection and Abdominal Pain    HPI Levorn Oleski is a 83 y.o. male with below list of chronic medical conditions including small bowel obstructions presents to the emergency department with 10 out of 10 generalized abdominal discomfort and nausea. Patient states abdomen was markedly distended at home however after episode of flatulence pain improved abdominal distention improved as well. Patient states current pain score is 1 out of 10 nausea persist however no vomiting. In addition Patient states that is currently being treated for urinary tract infection. Patient denies any fever. Last bowel movement yesterday.   Past Medical History:  Diagnosis Date  . Arthritis   . Bilateral renal cysts   . Bladder cancer [facility])    urologist-  dr Karsten Ro  . BPH (benign prostatic hyperplasia)   . Coronary artery disease    hx PCI w/ stenting in 10/ 2014 in [place]--- recently moved from [place] jan 2018 has not established a cardiologist yet but does have pcp   . Dyslipidemia   . Essential hypertension   . GERD (gastroesophageal reflux disease)   . Hematuria   . History of gastric ulcer   . History of hiatal hernia   . History of kidney stones   . History of melanoma in situ    several Excision's in situ and malignant melanoma's--  last one 06/ 2017 top of ear excision Stage 1 w/ negative margins/  other have been right upper arm, shoulder, chest, face  . History of MI (myocardial infarction)    10/ 2014-- s/p  PCI and stenting x2  . History of small bowel obstruction    fall 2017  s/p  abdominal lysis adhesions  (prior hx bowel obstruction total 8 times)  . Nocturia   . S/P right coronary artery (RCA) stent placement    10/ 2014  x2    . Type 2 diabetes, diet controlled ([place])   . Wears glasses   . Wears hearing aid    bilateral    Patient Active Problem List   Diagnosis Date Noted  . Chest pain, rule out acute myocardial infarction 10/07/2016  . Bladder tumor 09/02/2016  . Benign prostatic hyperplasia with weak urinary stream 08/18/2016  . Coronary artery disease involving native coronary artery of native heart without angina pectoris 08/18/2016  . Essential hypertension 08/18/2016  . GERD without esophagitis 08/18/2016    Past Surgical History:  Procedure Laterality Date  . ABDOMINAL HERNIA REPAIR  2012  . ADDOMINAL LYSIS ADHESIONS  fall 2017   sbo  . CHOLECYSTECTOMY OPEN  1971   and Appendectomy  . CORONARY ANGIOPLASTY WITH STENT PLACEMENT  04-17-2013  in [place]   x2 stents to RCA  . INGUINAL HERNIA REPAIR Bilateral 1995  . TOTAL HIP ARTHROPLASTY Left 2008  . TRANSURETHRAL RESECTION OF BLADDER TUMOR  09/2006   LEIOMYOMA   . TRANSURETHRAL RESECTION OF BLADDER TUMOR WITH MITOMYCIN-C N/A 09/02/2016   Procedure: TRANSURETHRAL RESECTION OF BLADDER TUMOR WITH MITOMYCIN-C;  Surgeon: Kathie Rhodes, MD;  Location: [facility];  Service: Urology;  Laterality: N/A;    Prior to Admission medications   Medication Sig Start Date End Date Taking? Authorizing Provider  aspirin EC 81 MG tablet Take 81 mg by  mouth daily.   Yes Historical Provider, MD  Black Pepper-Turmeric 3-500 MG CAPS Take 2 capsules by mouth daily.   Yes Historical Provider, MD  Chromium-Cinnamon (CINNAMON PLUS CHROMIUM PO) Take 1 tablet by mouth 2 (two) times daily. 1000mg  cinnamon/  250mg  chromium   Yes Historical Provider, MD  finasteride (PROSCAR) 5 MG tablet Take 5 mg by mouth every evening.    Yes Historical Provider, MD  hydrochlorothiazide (HYDRODIURIL) 25 MG tablet Take 12.5 mg by mouth every morning.    Yes Historical Provider, MD  losartan (COZAAR) 50 MG tablet Take 50 mg by mouth every evening.    Yes Historical Provider, MD   metoprolol tartrate (LOPRESSOR) 25 MG tablet Take 50 mg by mouth every evening.    Yes Historical Provider, MD  Multiple Vitamin (MULTIVITAMIN WITH MINERALS) TABS tablet Take 1 tablet by mouth daily.   Yes Historical Provider, MD  Omega-3 Fatty Acids (SUPER OMEGA 3 EPA/DHA) 1000 MG CAPS Take 1 capsule by mouth daily. Omega 3 - DHA - EPA - Fish oil-- 1000mg / 120mg / 180mg    Yes Historical Provider, MD  pantoprazole (PROTONIX) 40 MG tablet Take 40 mg by mouth every evening.    Yes Historical Provider, MD  phenazopyridine (PYRIDIUM) 200 MG tablet Take 1 tablet (200 mg total) by mouth 3 (three) times daily as needed for pain. 09/02/16  Yes Kathie Rhodes, MD  tamsulosin (FLOMAX) 0.4 MG CAPS capsule Take 0.4 mg by mouth every evening.    Yes Historical Provider, MD    Allergies Patient has no known allergies.  History reviewed. No pertinent family history.  Social History Social History  Substance Use Topics  . Smoking status: Never Smoker  . Smokeless tobacco: Never Used  . Alcohol use Yes     Comment: occasional    Review of Systems Constitutional: No fever/chills Eyes: No visual changes. ENT: No sore throat. Cardiovascular: Denies chest pain. Respiratory: Denies shortness of breath. Gastrointestinal:Positive for abdominal distention discomfort and nausea. Genitourinary: Negative for dysuria. Musculoskeletal: Negative for back pain. Integumentary: Negative for rash. Neurological: Negative for headaches, focal weakness or numbness.   ____________________________________________   PHYSICAL EXAM:  VITAL SIGNS: ED Triage Vitals  Enc Vitals Group     BP 10/28/16 0319 (!) 84/66     Pulse Rate 10/28/16 0319 70     Resp 10/28/16 0319 (!) 24     Temp 10/28/16 0319 97.8 F (36.6 C)     Temp Source 10/28/16 0319 Oral     SpO2 10/28/16 0319 92 %     Weight 10/28/16 0322 200 lb (90.7 kg)     Height 10/28/16 0322 5\' 10"  (1.778 m)     Head Circumference --      Peak Flow --      Pain  Score 10/28/16 0322 8     Pain Loc --      Pain Edu? --      Excl. in [place]? --     Constitutional: Alert and oriented. Well appearing and in no acute distress. Eyes: Conjunctivae are normal. PERRL. EOMI. Head: Atraumatic. Mouth/Throat: Mucous membranes are moist.  Oropharynx non-erythematous. Neck: No stridor.  Cardiovascular: Normal rate, regular rhythm. Good peripheral circulation. Grossly normal heart sounds. Respiratory: Normal respiratory effort.  No retractions. Lungs CTAB. Gastrointestinal: Right lower quadrant tenderness to palpation. Hyperperistalsis No distention.  Musculoskeletal: No lower extremity tenderness nor edema. No gross deformities of extremities. Neurologic:  Normal speech and language. No gross focal neurologic deficits are appreciated.  Skin:  Skin is warm, dry and intact. No rash noted. Psychiatric: Mood and affect are normal. Speech and behavior are normal.  ____________________________________________   LABS (all labs ordered are listed, but only abnormal results are displayed)  Labs Reviewed  COMPREHENSIVE METABOLIC PANEL - Abnormal; Notable for the following:       Result Value   Glucose, Bld 232 (*)    BUN 22 (*)    All other components within normal limits  CBC WITH DIFFERENTIAL/PLATELET - Abnormal; Notable for the following:    WBC 13.8 (*)    HCT 39.9 (*)    RDW 14.7 (*)    Neutro Abs 10.3 (*)    All other components within normal limits  CULTURE, BLOOD (ROUTINE X 2)  CULTURE, BLOOD (ROUTINE X 2)  URINE CULTURE  LACTIC ACID, PLASMA  PROTIME-INR  URINALYSIS, COMPLETE (UACMP) WITH MICROSCOPIC   ____________________________________________  EKG ED ECG REPORT I, [employer] N BROWN, the attending physician, personally viewed and interpreted this ECG.   Date: 10/28/2016  EKG Time: 3:24 AM  Rate: 74  Rhythm: Normal sinus rhythm  Axis: Normal  Intervals: Normal  ST&T Change:  None  ____________________________________________  RADIOLOGY I,  N BROWN, personally viewed and evaluated these images (plain radiographs) as part of my medical decision making, as well as reviewing the written report by the radiologist.  Dg Abdomen 1 View  Result Date: 10/28/2016 CLINICAL DATA:  Lower abdominal pain with nausea EXAM: ABDOMEN - 1 VIEW COMPARISON:  None. FINDINGS: Visualized lung bases are grossly clear. There is no free air beneath the diaphragm. Postsurgical changes in the upper abdomen. Nonobstructed gas pattern. Status post left hip replacement. No pathologic calcifications. IMPRESSION: Non obstructed bowel gas pattern Electronically Signed   By: Donavan Foil M.D.   On: 10/28/2016 04:09   Ct Angio Abd/pel W And/or Wo Contrast  Result Date: 10/28/2016 CLINICAL DATA:  Initial evaluation for acute lower abdominal pain, nausea. Evaluate for intra-abdominal aneurysm. EXAM: CTA ABDOMEN AND PELVIS WITHOUT AND WITH CONTRAST TECHNIQUE: Multidetector CT imaging of the abdomen and pelvis was performed using the standard protocol during bolus administration of intravenous contrast. Multiplanar reconstructed images and MIPs were obtained and reviewed to evaluate the vascular anatomy. CONTRAST:  125 cc of Isovue 370. COMPARISON:  Comparison made with prior radiograph from earlier the same day. FINDINGS: VASCULAR Aorta: Moderate calcified and noncalcified plaque present throughout the intra-abdominal aorta. Focal ectasia of the infrarenal aorta up to 2.7 cm in diameter (series 4, image 107). No frank aneurysm. No evidence for aortic dissection or other acute abnormality. Celiac: Focal plaque at the origin of the celiac axis with mild luminal stenosis. Celiac artery and its branch vessels are well opacified distally. Splenic artery tortuous with multifocal atheromatous plaque. SMA: SMA widely patent at its origin and is widely patent distally. Renals: Single renal arteries present  bilaterally, both of which are widely patent. IMA: IMA widely patent at its origin and well opacified distally. Inflow: Bilateral common, external, and internal iliac artery these are widely patent bilaterally. Mild scattered atheromatous plaque without flow-limiting stenosis. Proximal Outflow: Visualized major arteries of the upper thighs widely patent Veins: No acute venous abnormality. Review of the MIP images confirms the above findings. NON-VASCULAR Lower chest: Mild subsegmental atelectasis present within the visualized lung bases. Visualized lungs are otherwise clear. No pleural effusion. Trace pericardial effusion noted. Hepatobiliary: The liver demonstrates a normal contrast enhanced appearance. Gallbladder surgically absent. No biliary dilatation. Pancreas: Pancreas within normal limits. Spleen: Spleen within normal  limits. Adrenals/Urinary Tract: Adrenal glands are normal. Kidneys equal in size with symmetric enhancement. Scattered renal cysts present bilaterally. No nephrolithiasis, hydronephrosis, or focal enhancing renal mass. No hydroureter. Bladder partially distended and within normal limits. Stomach/Bowel: Small hiatal hernia with fluid in the distal esophageal lumen. Stomach moderately distended with fluid within the gastric lumen. There are multiple dilated fluid-filled loops of small bowel clustered within the mid and lower abdomen. Few scattered internal air-fluid levels. These measure up to 4 cm in diameter. There is an apparent transition point within the mid/right lower abdomen (series 5, image 69). Somewhat finding suggestive of mechanical small bowel obstruction underlying adhesive disease is suspected. Ileum is decompressed distally to the ileocecal valve. Colon of normal caliber. Colonic diverticulosis without evidence for acute diverticulitis. Lymphatic: No pathologically enlarged intra-abdominal or pelvic lymph nodes. Reproductive: Prostate is enlarged measuring 6.1 cm in transverse  diameter, and invaginates on the base of the bladder. Other: No free air. Trace scattered free fluid within the lower abdomen, likely reactive. Sequela prior hernia repair noted. Bilateral fat containing inguinal hernias noted, left larger than right. Musculoskeletal: Left total hip arthroplasty in place. No acute osseus abnormality. No worrisome lytic or blastic osseous lesions. Trace retrolisthesis of L2 on L3 noted. IMPRESSION: VASCULAR 1. No acute vascular abnormality identified within the abdomen and pelvis. 2. 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 Recommend followup by ultrasound in 5 years. This recommendation follows ACR consensus guidelines: White Paper of the ACR Incidental Findings Committee II on Vascular Findings. J Am Coll Radiol 2013; 10:789-794. 3. Mild scattered atheromatous plaque involving the major arterial vasculature of the abdomen and pelvis as above. NON-VASCULAR 1. Findings concerning for small bowel obstruction with transition point within the lower mid/right abdomen as above. Underlying adhesive disease is suspected. 2. No other acute intra-abdominal or pelvic process. 3. Colonic diverticulosis without evidence for acute diverticulitis. 4. Enlarged prostate. 5. Small hiatal hernia. Results discussed by telephone at the time of interpretation on 10/28/2016 at 6:43 am to Dr. Marjean Donna , who verbally acknowledged these results. Electronically Signed   By: Jeannine Boga M.D.   On: 10/28/2016 06:54     Procedures   ____________________________________________   INITIAL IMPRESSION / ASSESSMENT AND PLAN / ED COURSE  Pertinent labs & imaging results that were available during my care of the patient were reviewed by me and considered in my medical decision making (see chart for details).  83 year old male presenting with abdominal distention and nausea concerning for possible  partial small bowel obstruction which was  revealed on CT scan of the abdomen and pelvis. Patient discussed with Dr.Piscoya for hospital admission for further evaluation and management. NG tube placed      ____________________________________________  FINAL CLINICAL IMPRESSION(S) / ED DIAGNOSES  Final diagnoses:  Partial small bowel obstruction (HCC)     MEDICATIONS GIVEN DURING THIS VISIT:  Medications  iopamidol (ISOVUE-370) 76 % injection 125 mL (125 mLs Intravenous Contrast Given 10/28/16 0512)     NEW OUTPATIENT MEDICATIONS STARTED DURING THIS VISIT:  New Prescriptions   No medications on file    Modified Medications   No medications on file    Discontinued Medications   No medications on file     Note:  This document was prepared using Dragon voice recognition software and may include unintentional dictation errors.    Gregor Hams, MD 10/28/16 [id number]

## 2016-10-28 NOTE — ED Notes (Signed)
Patient reports small BM, and slight relief of discomfort

## 2016-10-28 NOTE — Progress Notes (Signed)
Pharmacy Antibiotic Note  [facility] is a 83 y.o. male admitted on 10/28/2016 with acute cystitis with hematuria.  Pharmacy has been consulted for ciprofloxacin dosing. Per MD, patient recently treated with cephalosporin. Pt has partial SBO so will start IV therapy.  Plan: Begin ciprofloxacin 400 mg IV q 12 hours. Recommended treatment for 5-7 days  Height: 5\' 10"  (177.8 cm) Weight: 200 lb (90.7 kg) IBW/kg (Calculated) : 73  Temp (24hrs), Avg:97.8 F (36.6 C), Min:97.8 F (36.6 C), Max:97.8 F (36.6 C)   Recent Labs Lab 10/28/16 0344  WBC 13.8*  CREATININE 0.92  LATICACIDVEN 1.6    Estimated Creatinine Clearance: 56.8 mL/min (by C-G formula based on SCr of 0.92 mg/dL).    No Known Allergies  Antimicrobials this admission: ciprofloxacin 5/4 >>   Dose adjustments this admission:   Microbiology results: 5/4 BCx: sent 5/4 UCx: sent    Thank you for allowing pharmacy to be a part of this patient's care.  Darrow Bussing, PharmD Pharmacy Resident 10/28/2016 2:17 PM

## 2016-10-28 NOTE — ED Notes (Signed)
Pt placed on 2L [place] after morphine administration

## 2016-10-28 NOTE — ED Notes (Signed)
MD Owens Shark updated family on plan of care.

## 2016-10-28 NOTE — ED Notes (Signed)
surgeon at bedside for admission

## 2016-10-28 NOTE — ED Notes (Signed)
Patient transported to CT 

## 2016-10-28 NOTE — H&P (Signed)
[facility] NAME: Tyler Pena    MR#:  [id number]  DATE OF BIRTH:  1920-08-28  DATE OF ADMISSION:  10/28/2016  PRIMARY CARE PHYSICIAN: Dion Body, MD   REQUESTING/REFERRING PHYSICIAN: Dr Evern Bio  CHIEF COMPLAINT:   Chief Complaint  Patient presents with  . Urinary Tract Infection  . Abdominal Pain    HISTORY OF PRESENT ILLNESS:  Tyler Pena  is a 83 y.o. male with a known history of Small bowel obstruction numerous times in the past. The patient knew what he had today. Severe pain in the right lower quadrant like he is having a baby crampy and steady in nature. Positive for nausea but no vomiting. In the ER he did have a bowel movement and did pass some gas. He presented with low blood pressure and his temperature being low. In the ER, CT scan showing bowel obstruction. The ER physician stated that the surgeon has come by and recommended medical admission since this is a partial small bowel obstruction.  PAST MEDICAL HISTORY:   Past Medical History:  Diagnosis Date  . Arthritis   . Bilateral renal cysts   . Bladder cancer [facility])    urologist-  dr Karsten Ro  . BPH (benign prostatic hyperplasia)   . Coronary artery disease    hx PCI w/ stenting in 10/ 2014 in [place]--- recently moved from [place] jan 2018 has not established a cardiologist yet but does have pcp   . Dyslipidemia   . Essential hypertension   . GERD (gastroesophageal reflux disease)   . Hematuria   . History of gastric ulcer   . History of hiatal hernia   . History of kidney stones   . History of melanoma in situ    several Excision's in situ and malignant melanoma's--  last one 06/ 2017 top of ear excision Stage 1 w/ negative margins/  other have been right upper arm, shoulder, chest, face  . History of MI (myocardial infarction)    10/ 2014-- s/p  PCI and stenting x2  . History of small bowel obstruction    fall 2017  s/p   abdominal lysis adhesions  (prior hx bowel obstruction total 8 times)  . Nocturia   . S/P right coronary artery (RCA) stent placement    10/ 2014  x2  . Type 2 diabetes, diet controlled ([place])   . Wears glasses   . Wears hearing aid    bilateral    PAST SURGICAL HISTORY:   Past Surgical History:  Procedure Laterality Date  . ABDOMINAL HERNIA REPAIR  2012  . ADDOMINAL LYSIS ADHESIONS  fall 2017   [facility]  . CHOLECYSTECTOMY OPEN  1971   and Appendectomy  . CORONARY ANGIOPLASTY WITH STENT PLACEMENT  04-17-2013  in [place]   x2 stents to RCA  . INGUINAL HERNIA REPAIR Bilateral 1995  . TOTAL HIP ARTHROPLASTY Left 2008  . TRANSURETHRAL RESECTION OF BLADDER TUMOR  09/2006   LEIOMYOMA   . TRANSURETHRAL RESECTION OF BLADDER TUMOR WITH MITOMYCIN-C N/A 09/02/2016   Procedure: TRANSURETHRAL RESECTION OF BLADDER TUMOR WITH MITOMYCIN-C;  Surgeon: Kathie Rhodes, MD;  Location: [facility];  Service: Urology;  Laterality: N/A;    SOCIAL HISTORY:   Social History  Substance Use Topics  . Smoking status: Never Smoker  . Smokeless tobacco: Never Used  . Alcohol use Yes     Comment: occasional    FAMILY HISTORY:   Family History  Problem Relation Age of Onset  . Cancer Mother   . CAD Father   . CAD Sister   . CAD Sister   . CAD Sister     DRUG ALLERGIES:  No Known Allergies  REVIEW OF SYSTEMS:  CONSTITUTIONAL: Low temperature. Positive for sweating episode. Positive for chills. Positive for fatigue.  EYES: No blurred or double vision. Wears bifocals EARS, NOSE, AND THROAT: No tinnitus or ear pain. No sore throat. Has 2 hearing aids RESPIRATORY: No cough. Positive for shortness of breath with exertion. No wheezing or hemoptysis.  CARDIOVASCULAR: No chest pain, orthopnea, edema.  GASTROINTESTINAL: Positive for nausea, and abdominal pain. No blood in bowel movements. No vomiting GENITOURINARY: Recently was on a cephalosporin for urinary tract infection ENDOCRINE: No  polyuria, nocturia,  HEMATOLOGY: No anemia, easy bruising or bleeding SKIN: No rash or lesion. MUSCULOSKELETAL: Positive for arthritis.   NEUROLOGIC: No tingling, numbness. Recent near syncope  PSYCHIATRY: No anxiety or depression.   MEDICATIONS AT HOME:   Prior to Admission medications   Medication Sig Start Date End Date Taking? Authorizing Provider  aspirin EC 81 MG tablet Take 81 mg by mouth daily.   Yes Historical Provider, MD  Black Pepper-Turmeric 3-500 MG CAPS Take 2 capsules by mouth daily.   Yes Historical Provider, MD  Chromium-Cinnamon (CINNAMON PLUS CHROMIUM PO) Take 1 tablet by mouth 2 (two) times daily. 1000mg  cinnamon/  250mg  chromium   Yes Historical Provider, MD  finasteride (PROSCAR) 5 MG tablet Take 5 mg by mouth every evening.    Yes Historical Provider, MD  hydrochlorothiazide (HYDRODIURIL) 25 MG tablet Take 12.5 mg by mouth every morning.    Yes Historical Provider, MD  losartan (COZAAR) 50 MG tablet Take 50 mg by mouth every evening.    Yes Historical Provider, MD  metoprolol tartrate (LOPRESSOR) 25 MG tablet Take 50 mg by mouth every evening.    Yes Historical Provider, MD  Multiple Vitamin (MULTIVITAMIN WITH MINERALS) TABS tablet Take 1 tablet by mouth daily.   Yes Historical Provider, MD  Omega-3 Fatty Acids (SUPER OMEGA 3 EPA/DHA) 1000 MG CAPS Take 1 capsule by mouth daily. Omega 3 - DHA - EPA - Fish oil-- 1000mg / 120mg / 180mg    Yes Historical Provider, MD  pantoprazole (PROTONIX) 40 MG tablet Take 40 mg by mouth every evening.    Yes Historical Provider, MD  phenazopyridine (PYRIDIUM) 200 MG tablet Take 1 tablet (200 mg total) by mouth 3 (three) times daily as needed for pain. 09/02/16  Yes Kathie Rhodes, MD  tamsulosin (FLOMAX) 0.4 MG CAPS capsule Take 0.4 mg by mouth every evening.    Yes Historical Provider, MD      VITAL SIGNS:  Blood pressure 116/67, pulse 62, temperature 97.8 F (36.6 C), temperature source Oral, resp. rate 13, height 5\' 10"  (1.778 m),  weight 90.7 kg (200 lb), SpO2 97 %.  PHYSICAL EXAMINATION:  GENERAL:  83 y.o.-year-old patient lying in the bed with no acute distress.  EYES: Pupils equal, round, reactive to light and accommodation. No scleral icterus. Extraocular muscles intact.  HEENT: Head atraumatic, normocephalic. Oropharynx and nasopharynx clear.  NECK:  Supple, no jugular venous distention. No thyroid enlargement, no tenderness.  LUNGS: Normal breath sounds bilaterally, no wheezing, rales,rhonchi or crepitation. No use of accessory muscles of respiration.  CARDIOVASCULAR: S1, S2 normal. No murmurs, rubs, or gallops.  ABDOMEN: Soft, Slight right lower quadrant tender, distended. Ventral hernia. Bowel sounds Decreased. No organomegaly or mass.  EXTREMITIES: Trace edema, no cyanosis, or  clubbing.  NEUROLOGIC: Cranial nerves II through XII are intact. Muscle strength 5/5 in all extremities. Sensation intact. Gait not checked.  PSYCHIATRIC: The patient is alert and oriented x 3.  SKIN: No rash, lesion, or ulcer.   LABORATORY PANEL:   CBC  Recent Labs Lab 10/28/16 0344  WBC 13.8*  HGB 13.8  HCT 39.9*  PLT 268   ------------------------------------------------------------------------------------------------------------------  Chemistries   Recent Labs Lab 10/28/16 0344  NA 136  K 3.6  CL 101  CO2 26  GLUCOSE 232*  BUN 22*  CREATININE 0.92  CALCIUM 9.3  AST 32  ALT 17  ALKPHOS 68  BILITOT 0.8   ------------------------------------------------------------------------------------------------------------------  -----------------------------------------------------------------------------------------------  RADIOLOGY:  Dg Abdomen 1 View  Result Date: 10/28/2016 CLINICAL DATA:  Lower abdominal pain with nausea EXAM: ABDOMEN - 1 VIEW COMPARISON:  None. FINDINGS: Visualized lung bases are grossly clear. There is no free air beneath the diaphragm. Postsurgical changes in the upper abdomen. Nonobstructed  gas pattern. Status post left hip replacement. No pathologic calcifications. IMPRESSION: Non obstructed bowel gas pattern Electronically Signed   By: Donavan Foil M.D.   On: 10/28/2016 04:09   Ct Angio Abd/pel W And/or Wo Contrast  Result Date: 10/28/2016 CLINICAL DATA:  Initial evaluation for acute lower abdominal pain, nausea. Evaluate for intra-abdominal aneurysm. EXAM: CTA ABDOMEN AND PELVIS WITHOUT AND WITH CONTRAST TECHNIQUE: Multidetector CT imaging of the abdomen and pelvis was performed using the standard protocol during bolus administration of intravenous contrast. Multiplanar reconstructed images and MIPs were obtained and reviewed to evaluate the vascular anatomy. CONTRAST:  125 cc of Isovue 370. COMPARISON:  Comparison made with prior radiograph from earlier the same day. FINDINGS: VASCULAR Aorta: Moderate calcified and noncalcified plaque present throughout the intra-abdominal aorta. Focal ectasia of the infrarenal aorta up to 2.7 cm in diameter (series 4, image 107). No frank aneurysm. No evidence for aortic dissection or other acute abnormality. Celiac: Focal plaque at the origin of the celiac axis with mild luminal stenosis. Celiac artery and its branch vessels are well opacified distally. Splenic artery tortuous with multifocal atheromatous plaque. SMA: SMA widely patent at its origin and is widely patent distally. Renals: Single renal arteries present bilaterally, both of which are widely patent. IMA: IMA widely patent at its origin and well opacified distally. Inflow: Bilateral common, external, and internal iliac artery these are widely patent bilaterally. Mild scattered atheromatous plaque without flow-limiting stenosis. Proximal Outflow: Visualized major arteries of the upper thighs widely patent Veins: No acute venous abnormality. Review of the MIP images confirms the above findings. NON-VASCULAR Lower chest: Mild subsegmental atelectasis present within the visualized lung bases.  Visualized lungs are otherwise clear. No pleural effusion. Trace pericardial effusion noted. Hepatobiliary: The liver demonstrates a normal contrast enhanced appearance. Gallbladder surgically absent. No biliary dilatation. Pancreas: Pancreas within normal limits. Spleen: Spleen within normal limits. Adrenals/Urinary Tract: Adrenal glands are normal. Kidneys equal in size with symmetric enhancement. Scattered renal cysts present bilaterally. No nephrolithiasis, hydronephrosis, or focal enhancing renal mass. No hydroureter. Bladder partially distended and within normal limits. Stomach/Bowel: Small hiatal hernia with fluid in the distal esophageal lumen. Stomach moderately distended with fluid within the gastric lumen. There are multiple dilated fluid-filled loops of small bowel clustered within the mid and lower abdomen. Few scattered internal air-fluid levels. These measure up to 4 cm in diameter. There is an apparent transition point within the mid/right lower abdomen (series 5, image 69). Somewhat finding suggestive of mechanical small bowel obstruction underlying adhesive disease is  suspected. Ileum is decompressed distally to the ileocecal valve. Colon of normal caliber. Colonic diverticulosis without evidence for acute diverticulitis. Lymphatic: No pathologically enlarged intra-abdominal or pelvic lymph nodes. Reproductive: Prostate is enlarged measuring 6.1 cm in transverse diameter, and invaginates on the base of the bladder. Other: No free air. Trace scattered free fluid within the lower abdomen, likely reactive. Sequela prior hernia repair noted. Bilateral fat containing inguinal hernias noted, left larger than right. Musculoskeletal: Left total hip arthroplasty in place. No acute osseus abnormality. No worrisome lytic or blastic osseous lesions. Trace retrolisthesis of L2 on L3 noted. IMPRESSION: VASCULAR 1. No acute vascular abnormality identified within the abdomen and pelvis. 2. Ectatic infrarenal aorta  measuring up to 2.7 cm in diameter, at risk for aneurysm development. Recommend followup by ultrasound in 5 years. This recommendation follows ACR consensus guidelines: White Paper of the ACR Incidental Findings Committee II on Vascular Findings. J Am Coll Radiol 2013; 10:789-794. 3. Mild scattered atheromatous plaque involving the major arterial vasculature of the abdomen and pelvis as above. NON-VASCULAR 1. Findings concerning for small bowel obstruction with transition point within the lower mid/right abdomen as above. Underlying adhesive disease is suspected. 2. No other acute intra-abdominal or pelvic process. 3. Colonic diverticulosis without evidence for acute diverticulitis. 4. Enlarged prostate. 5. Small hiatal hernia. Results discussed by telephone at the time of interpretation on 10/28/2016 at 6:43 am to Dr. Marjean Donna , who verbally acknowledged these results. Electronically Signed   By: Jeannine Boga M.D.   On: 10/28/2016 06:54    EKG:   Normal sinus rhythm 70 bpm  IMPRESSION AND PLAN:   1. Partial small bowel obstruction. Surgery consultation. NG tube to suction. Nothing by mouth. When necessary IV pain medications. When necessary IV nausea medications. Gentle IV fluid hydration. Conservative management. Numerous abdominal surgeries as his risk. Likely adhesions causing this. 2. Hypokalemia. Add potassium to IV fluid hydration. 3. Acute cystitis with hematuria. The patient was recently on a cephalosporin. Switch antibiotics to Cipro. Send off urine culture. 4. Relative hypotension. Hold antihypertensive medications and give gentle IV fluid hydration 5. Type 2 diabetes mellitus on diet control. Sliding scale insulin ordered 6. History of gastric ulcer, hiatal hernia and GERD. Change PPI to IV 7. BPH. Try to continue finasteride tomorrow if possible 8. History of bladder cancer  All the records are reviewed and case discussed with ED provider. Management plans discussed with  the patient, family and they are in agreement.  CODE STATUS: DO NOT RESUSCITATE  TOTAL TIME TAKING CARE OF THIS PATIENT: 50 minutes.    Loletha Grayer M.D on 10/28/2016 at 1:05 PM  Between 7am to 6pm - Pager - [phone]  After 6pm call admission pager [phone]  [facility]  [phone]  CC: Primary care physician; Dion Body, MD

## 2016-10-28 NOTE — ED Notes (Signed)
Pt resting in bed, resp even and unlabored, pt aware of pending admission

## 2016-10-28 NOTE — ED Notes (Addendum)
Patient's grandson Tyler Pena would like to be updated for any changes in patient status. Pt gave verbal consent.   [phone]

## 2016-10-28 NOTE — Consult Note (Signed)
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 While in the ED, patient passed some flatus and a BM, but continued to report severe nausea. CT was performed, and NG tube was inserted, which immediately drained ~800 mL of yellowish-green fluid, providing complete relief of patient's pain and nausea. All but one of patient's prior SBO's have resolved with non-operative management involving placement of an NG tube (one requiring surgery in 2017). Patient otherwise denies fever/chills, CP, or SOB and reports he is able to walk and climb stairs without CP or SOB.   Surgery is consulted by ED physician Dr. Owens Shark in this context for evaluation and management of small bowel obstruction.  PAST MEDICAL HISTORY (PMH):  Past Medical History:  Diagnosis Date  . Arthritis   . Bilateral renal cysts   . Bladder cancer [facility])    urologist-  dr Karsten Ro  . BPH (benign prostatic hyperplasia)   . Coronary artery disease    hx PCI w/ stenting in 10/ 2014 in [place]--- recently moved from [place] jan 2018 has not established a cardiologist yet but does have pcp   . Dyslipidemia   . Essential hypertension   . GERD (gastroesophageal reflux disease)   . Hematuria   . History of gastric ulcer   . History of hiatal hernia   . History of kidney stones   . History of melanoma in situ    several Excision's in situ and malignant melanoma's--  last one 06/ 2017 top of ear excision Stage 1 w/ negative margins/  other have been right upper arm, shoulder, chest, face  . History of MI (myocardial infarction)    10/ 2014-- s/p  PCI and stenting x2  . History of small bowel obstruction    fall 2017  s/p  abdominal lysis adhesions  (prior hx bowel obstruction total 8  times)  . Nocturia   . S/P right coronary artery (RCA) stent placement    10/ 2014  x2  . Type 2 diabetes, diet controlled ([place])   . Wears glasses   . Wears hearing aid    bilateral     PAST SURGICAL HISTORY ([place]):  Past Surgical History:  Procedure Laterality Date  . ABDOMINAL HERNIA REPAIR  2012  . ADDOMINAL LYSIS ADHESIONS  fall 2017   sbo  . CHOLECYSTECTOMY OPEN  1971   and Appendectomy  . CORONARY ANGIOPLASTY WITH STENT PLACEMENT  04-17-2013  in [place]   x2 stents to RCA  . INGUINAL HERNIA REPAIR Bilateral 1995  . TOTAL HIP ARTHROPLASTY Left 2008  . TRANSURETHRAL RESECTION OF BLADDER TUMOR  09/2006   LEIOMYOMA   . TRANSURETHRAL RESECTION OF BLADDER TUMOR WITH MITOMYCIN-C N/A 09/02/2016   Procedure: TRANSURETHRAL RESECTION OF BLADDER TUMOR WITH MITOMYCIN-C;  Surgeon: Kathie Rhodes, MD;  Location: [facility];  Service: Urology;  Laterality: N/A;     MEDICATIONS:  Prior to Admission medications   Medication Sig Start Date End Date Taking? Authorizing Provider  aspirin EC 81 MG tablet Take 81 mg by mouth daily.   Yes Historical Provider, MD  Black Pepper-Turmeric 3-500 MG CAPS Take 2 capsules by mouth daily.   Yes Historical Provider, MD  Chromium-Cinnamon (CINNAMON PLUS CHROMIUM PO) Take 1 tablet by mouth 2 (  two) times daily. 1000mg  cinnamon/  250mg  chromium   Yes Historical Provider, MD  finasteride (PROSCAR) 5 MG tablet Take 5 mg by mouth every evening.    Yes Historical Provider, MD  hydrochlorothiazide (HYDRODIURIL) 25 MG tablet Take 12.5 mg by mouth every morning.    Yes Historical Provider, MD  losartan (COZAAR) 50 MG tablet Take 50 mg by mouth every evening.    Yes Historical Provider, MD  metoprolol tartrate (LOPRESSOR) 25 MG tablet Take 50 mg by mouth every evening.    Yes Historical Provider, MD  Multiple Vitamin (MULTIVITAMIN WITH MINERALS) TABS tablet Take 1 tablet by mouth daily.   Yes Historical Provider, MD  Omega-3 Fatty Acids (SUPER OMEGA 3  EPA/DHA) 1000 MG CAPS Take 1 capsule by mouth daily. Omega 3 - DHA - EPA - Fish oil-- 1000mg / 120mg / 180mg    Yes Historical Provider, MD  pantoprazole (PROTONIX) 40 MG tablet Take 40 mg by mouth every evening.    Yes Historical Provider, MD  phenazopyridine (PYRIDIUM) 200 MG tablet Take 1 tablet (200 mg total) by mouth 3 (three) times daily as needed for pain. 09/02/16  Yes Kathie Rhodes, MD  tamsulosin (FLOMAX) 0.4 MG CAPS capsule Take 0.4 mg by mouth every evening.    Yes Historical Provider, MD     ALLERGIES:  No Known Allergies   SOCIAL HISTORY:  Social History   Social History  . Marital status: Married    Spouse name: N/A  . Number of children: N/A  . Years of education: N/A   Occupational History  . Not on file.   Social History Main Topics  . Smoking status: Never Smoker  . Smokeless tobacco: Never Used  . Alcohol use Yes     Comment: occasional  . Drug use: No  . Sexual activity: Not on file   Other Topics Concern  . Not on file   Social History Narrative  . No narrative on file    The patient currently resides (home / rehab facility / nursing home): Home  The patient normally is (ambulatory / bedbound): Ambulatory   FAMILY HISTORY:  History reviewed. No pertinent family history.   REVIEW OF SYSTEMS:  Constitutional: denies weight loss, fever, chills, or sweats  Eyes: denies any other vision changes, history of eye injury  ENT: denies sore throat, hearing problems  Respiratory: denies shortness of breath, wheezing  Cardiovascular: denies chest pain, palpitations  Gastrointestinal: abdominal pain, N/V, and bowel function as per HPI Genitourinary: denies burning with urination or urinary frequency Musculoskeletal: denies any other joint pains or cramps  Skin: denies any other rashes or skin discolorations  Neurological: denies any other headache, dizziness, weakness  Psychiatric: denies any other depression, anxiety   All other review of systems were negative    VITAL SIGNS:  Temp:  [97.8 F (36.6 C)] 97.8 F (36.6 C) (05/04 0319) Pulse Rate:  [68-82] 77 (05/04 0700) Resp:  [10-24] 10 (05/04 0700) BP: (84-139)/(64-85) 137/85 (05/04 0700) SpO2:  [92 %-98 %] 98 % (05/04 0700) Weight:  [200 lb (90.7 kg)] 200 lb (90.7 kg) (05/04 0322)     Height: 5\' 10"  (177.8 cm) Weight: 200 lb (90.7 kg) BMI (Calculated): 28.8   INTAKE/OUTPUT:  This shift: No intake/output data recorded.  Last 2 shifts: @IOLAST2SHIFTS @   PHYSICAL EXAM:  Constitutional:  -- Normal body habitus  -- Awake, alert, and oriented x3  Eyes:  -- Pupils equally round and reactive to light  -- No scleral icterus  Ear, nose,  and throat:  -- No jugular venous distension  Pulmonary:  -- No crackles  -- Equal breath sounds bilaterally -- Breathing non-labored at rest Cardiovascular:  -- S1, S2 present  -- No pericardial rubs Gastrointestinal:  -- Abdomen soft, nontender, moderately distended, no guarding/rebound -- Multiple well-healed linear surgical incisions/scars consistent with surgical history and palpable mid-abdominal mesh -- No abdominal masses appreciated, pulsatile or otherwise  Musculoskeletal and Integumentary:  -- Wounds or skin discoloration: None appreciated except well-healed post-surgical abdominal wounds -- Extremities: B/L UE and LE FROM, hands and feet warm, no edema  Neurologic:  -- Motor function: intact and symmetric -- Sensation: intact and symmetric  Labs:  CBC:  Lab Results  Component Value Date   WBC 13.8 (H) 10/28/2016   RBC 4.67 10/28/2016   CMP     Component Value Date/Time   NA 136 10/28/2016 0344   K 3.6 10/28/2016 0344   CL 101 10/28/2016 0344   CO2 26 10/28/2016 0344   GLUCOSE 232 (H) 10/28/2016 0344   BUN 22 (H) 10/28/2016 0344   CREATININE 0.92 10/28/2016 0344   CALCIUM 9.3 10/28/2016 0344   PROT 7.6 10/28/2016 0344   ALBUMIN 4.1 10/28/2016 0344   AST 32 10/28/2016 0344   ALT 17 10/28/2016 0344   ALKPHOS 68 10/28/2016 0344    BILITOT 0.8 10/28/2016 0344   GFRNONAA >60 10/28/2016 0344   GFRAA >60 10/28/2016 0344     Imaging studies:  CT Abdomen and Pelvis with Contrast (10/28/2016) Small hiatal hernia with fluid in the distal esophageal  lumen. Stomach moderately distended with fluid within the  gastric lumen. There are multiple dilated fluid-filled loops of small bowel clustered within the mid and lower abdomen. Few scattered internal air-fluid levels. These measure up to 4 cm in diameter. There is an apparent transition point within the mid/right lower abdomen (series 5, image 69). Somewhat finding suggestive of mechanical small bowel obstruction underlying adhesive disease is suspected. Ileum is decompressed distally to the ileocecal valve. Colon of normal caliber. Colonic diverticulosis without evidence for acute diverticulitis. No free air. Trace scattered free fluid within the lower abdomen, likely reactive. Sequela prior hernia repair noted. Bilateral  fat-containing inguinal hernias noted, left larger than right.  Assessment/Plan: (ICD-10's: K88.51) 83 y.o. male with likely partial small bowel obstruction (considering passage of flatus and BM while in ED), likely attributable to post-surgical adhesions following multiple prior open abdominal surgeries, complicated by pertinent comorbidities including diet-controlled DM, HTN, HLD, CAD s/p PCI, history of multiply recurrent melanomas and bladder cancer, BPH, nephrolithiasis, GERD with history of PUD and hiatal hernia, osteoarthritis, and hearing loss.              - NPO, IVF              - continue NG tube for nasogastric decompression             - monitor ongoing bowel function and abdominal exam              - anticipate symptomatic relief within 24 - 48 hours following NGT insertion, followed by "rumbling" the following day and flatus either the same day or the day following the "rumbling" with anticipated length of stay ~3 - 5 days with successful  non-operative management for 8 of 10 patients with small bowel obstruction secondary to adhesions (all times of which may be anticipated to be shorter considering partial SBO and presentation)             -  medical management as per medical team             - ambulation encouraged              - DVT prophylaxis  Thank you for the opportunity to participate in this patient's care.   -- Tyler Pena Rosana Hoes, MD, [place]: [facility] - Partnering for exceptional care. Office: [phone]

## 2016-10-29 LAB — MAGNESIUM: Magnesium: 1.7 mg/dL (ref 1.7–2.4)

## 2016-10-29 LAB — CBC
HEMATOCRIT: 36.3 % — AB (ref 40.0–52.0)
Hemoglobin: 12.2 g/dL — ABNORMAL LOW (ref 13.0–18.0)
MCH: 29 pg (ref 26.0–34.0)
MCHC: 33.5 g/dL (ref 32.0–36.0)
MCV: 86.6 fL (ref 80.0–100.0)
PLATELETS: 207 10*3/uL (ref 150–440)
RBC: 4.19 MIL/uL — AB (ref 4.40–5.90)
RDW: 14.9 % — ABNORMAL HIGH (ref 11.5–14.5)
WBC: 7.1 10*3/uL (ref 3.8–10.6)

## 2016-10-29 LAB — BASIC METABOLIC PANEL
ANION GAP: 5 (ref 5–15)
BUN: 19 mg/dL (ref 6–20)
CALCIUM: 8 mg/dL — AB (ref 8.9–10.3)
CO2: 29 mmol/L (ref 22–32)
CREATININE: 0.76 mg/dL (ref 0.61–1.24)
Chloride: 104 mmol/L (ref 101–111)
Glucose, Bld: 154 mg/dL — ABNORMAL HIGH (ref 65–99)
Potassium: 3.4 mmol/L — ABNORMAL LOW (ref 3.5–5.1)
SODIUM: 138 mmol/L (ref 135–145)

## 2016-10-29 LAB — URINE CULTURE: CULTURE: NO GROWTH

## 2016-10-29 LAB — GLUCOSE, CAPILLARY
GLUCOSE-CAPILLARY: 129 mg/dL — AB (ref 65–99)
GLUCOSE-CAPILLARY: 137 mg/dL — AB (ref 65–99)
GLUCOSE-CAPILLARY: 125 mg/dL — AB (ref 65–99)
Glucose-Capillary: 138 mg/dL — ABNORMAL HIGH (ref 65–99)

## 2016-10-29 MED ORDER — CEPHALEXIN 500 MG PO CAPS: 500.0000 mg | ORAL_CAPSULE | Freq: Three times a day (TID) | ORAL | 0 refills | Status: AC

## 2016-10-29 MED ORDER — METOPROLOL TARTRATE 50 MG PO TABS
50.0000 mg | ORAL_TABLET | Freq: Every evening | ORAL | Status: DC
  Administered 2016-10-29: 50 mg via ORAL
  Filled 2016-10-29: qty 1

## 2016-10-29 MED ORDER — LOSARTAN POTASSIUM 50 MG PO TABS
50.0000 mg | ORAL_TABLET | Freq: Every evening | ORAL | Status: DC
  Administered 2016-10-29: 50 mg via ORAL
  Filled 2016-10-29: qty 1

## 2016-10-29 MED ORDER — HYDROCHLOROTHIAZIDE 25 MG PO TABS
12.5000 mg | ORAL_TABLET | Freq: Every morning | ORAL | Status: DC
Start: 2016-10-29 — End: 2016-10-30
  Administered 2016-10-29 – 2016-10-30 (×2): 12.5 mg via ORAL
  Filled 2016-10-29 (×2): qty 1

## 2016-10-29 NOTE — Progress Notes (Signed)
Per telephone conversation with Dr. Benjie Karvonen earlier in shift, see how patient tolerates dinner. Dinner was served around [employer]. Patient reports feeling crampy, no nausea or vomiting after soft diet. No bowel movements but is passing gas.  Patient states that he would like to "stay overnight to play it safe." Dr. Benjie Karvonen paged at [phone].  Dr. Verdell Carmine (on call) paged at [phone], will get message to Dr. Benjie Karvonen.

## 2016-10-29 NOTE — Progress Notes (Signed)
SURGICAL PROGRESS NOTE (cpt [phone])  Hospital Day(s): 1.   Post op day(s):  Marland Kitchen   Interval History: Patient seen and examined, no acute events or new complaints overnight. Patient reports complete resolution of pain, nausea, and abdominal distention with large BM and +flatus, denies fever/chills, CP, or SOB.  Review of Systems:  Constitutional: denies fever, chills  HEENT: denies cough or congestion  Respiratory: denies any shortness of breath  Cardiovascular: denies chest pain or palpitations  Gastrointestinal: abdominal pain, N/V, and bowel function as per interval history Genitourinary: denies burning with urination or urinary frequency Musculoskeletal: denies pain, decreased motor or sensation Integumentary: denies any other rashes or skin discolorations Neurological: denies HA or vision/hearing changes   Vital signs in last 24 hours: [min-max] current  Temp:  [98.1 F (36.7 C)-99.1 F (37.3 C)] 98.1 F (36.7 C) (05/05 0437) Pulse Rate:  [61-77] 77 (05/05 0437) Resp:  [12-20] 18 (05/05 0437) BP: (105-145)/(53-71) 143/63 (05/05 0437) SpO2:  [93 %-97 %] 93 % (05/05 0437) Weight:  [199 lb (90.3 kg)] 199 lb (90.3 kg) (05/04 1400)     Height: 5\' 11"  (180.3 cm) Weight: 199 lb (90.3 kg) BMI (Calculated): 27.8   Intake/Output this shift:  No intake/output data recorded.   Intake/Output last 2 shifts:  @IOLAST2SHIFTS @   Physical Exam:  Constitutional: alert, cooperative and no distress  HENT: normocephalic without obvious abnormality  Eyes: PERRL, EOM's grossly intact and symmetric  Neuro: CN II - XII grossly intact and symmetric without deficit  Respiratory: breathing non-labored at rest  Cardiovascular: regular rate and sinus rhythm  Gastrointestinal: soft, non-tender, and non-distended  Musculoskeletal: UE and LE FROM, no edema or wounds, motor and sensation grossly intact, NT   Labs:  CBC Latest Ref Rng & Units 10/29/2016 10/28/2016 10/07/2016  WBC 3.8 - 10.6 K/uL 7.1  13.8(H) 9.5  Hemoglobin 13.0 - 18.0 g/dL 12.2(L) 13.8 13.9  Hematocrit 40.0 - 52.0 % 36.3(L) 39.9(L) 40.8  Platelets 150 - 440 K/uL 207 268 206   CMP Latest Ref Rng & Units 10/29/2016 10/28/2016 10/07/2016  Glucose 65 - 99 mg/dL 154(H) 232(H) 187(H)  BUN 6 - 20 mg/dL 19 22(H) 17  Creatinine 0.61 - 1.24 mg/dL 0.76 0.92 0.88  Sodium 135 - 145 mmol/L 138 136 134(L)  Potassium 3.5 - 5.1 mmol/L 3.4(L) 3.6 3.3(L)  Chloride 101 - 111 mmol/L 104 101 99(L)  CO2 22 - 32 mmol/L 29 26 28   Calcium 8.9 - 10.3 mg/dL 8.0(L) 9.3 8.9  Total Protein 6.5 - 8.1 g/dL - 7.6 -  Total Bilirubin 0.3 - 1.2 mg/dL - 0.8 -  Alkaline Phos 38 - 126 U/L - 68 -  AST 15 - 41 U/L - 32 -  ALT 17 - 63 U/L - 17 -   Imaging studies: No new pertinent imaging studies   Assessment/Plan: (ICD-10's: K56.51) 83 y.o. malewith likely partial small bowel obstruction (considering passage of flatus and BM while in ED), likely attributable to post-surgical adhesions following multiple prior open abdominal surgeries, complicated by pertinent comorbidities including diet-controlled DM, HTN, HLD, CAD s/p PCI, history of multiply recurrent melanomas and bladder cancer, BPH, nephrolithiasis, GERD with history of PUD and hiatal hernia, osteoarthritis, and hearing loss.  - NG tube removed, RN notified - start clear liquids, advance to soft diet for lunch  - if tolerates lunch with ongoing flatus and without abdominal pain or N/V, okay with discharge home - patient instructed to call office or return to ED if symptoms recur/worsen  -  medical management of comorbidities as per primary medical team - ambulation encouraged - DVT prophylaxis  Thank you for the opportunity to participate in this patient's care.   -- Marilynne Drivers Rosana Hoes, MD, [place]: [facility] - Partnering for exceptional care. Office: [phone]

## 2016-10-29 NOTE — Discharge Summary (Signed)
[facility] [place] NAME: Tyler Pena    MR#:  [id number]  DATE OF BIRTH:  Jan 21, 1921  DATE OF ADMISSION:  10/28/2016 ADMITTING PHYSICIAN: Loletha Grayer, MD  DATE OF DISCHARGE: [phone]  PRIMARY CARE PHYSICIAN: Dion Body, MD    ADMISSION DIAGNOSIS:  Partial small bowel obstruction ([place]) [K56.600]  DISCHARGE DIAGNOSIS:  Active Problems:   Partial small bowel obstruction ([place]) UTI  SECONDARY DIAGNOSIS:   Past Medical History:  Diagnosis Date  . Arthritis   . Bilateral renal cysts   . Bladder cancer [facility])    urologist-  dr Karsten Ro  . BPH (benign prostatic hyperplasia)   . Coronary artery disease    hx PCI w/ stenting in 10/ 2014 in [place]--- recently moved from [place] jan 2018 has not established a cardiologist yet but does have pcp   . Dyslipidemia   . Essential hypertension   . GERD (gastroesophageal reflux disease)   . Hematuria   . History of gastric ulcer   . History of hiatal hernia   . History of kidney stones   . History of melanoma in situ    several Excision's in situ and malignant melanoma's--  last one 06/ 2017 top of ear excision Stage 1 w/ negative margins/  other have been right upper arm, shoulder, chest, face  . History of MI (myocardial infarction)    10/ 2014-- s/p  PCI and stenting x2  . History of small bowel obstruction    fall 2017  s/p  abdominal lysis adhesions  (prior hx bowel obstruction total 8 times)  . Nocturia   . S/P right coronary artery (RCA) stent placement    10/ 2014  x2  . Type 2 diabetes, diet controlled ([place])   . Wears glasses   . Wears hearing aid    bilateral    HOSPITAL COURSE:  83 year old male with history of small bowel obstruction due to adhesions he comes in with partial small bowel obstruction  1. Partial small bowel obstruction which has resolved with NG tube decompression Patient tolerated diet. Surgical consultation was appreciated  2. BPH:  Patient may continue finasteride and tamsulosin  3. Essential hypertension: Continue metoprolol, losartan and HCTZ  4. GERD: On PPI  5. UTI: Patient will continue on Keflex with follow-up urine analysis in 2 weeks. U cx showing no growth, however clinically had some symptoms.  DISCHARGE CONDITIONS AND DIET:   Stable Heart healthy   CONSULTS OBTAINED:  Treatment Team:  Olean Ree, MD  DRUG ALLERGIES:  No Known Allergies  DISCHARGE MEDICATIONS:   Current Discharge Medication List    START taking these medications   Details  cephALEXin (KEFLEX) 500 MG capsule Take 1 capsule (500 mg total) by mouth 3 (three) times daily. Qty: 24 capsule, Refills: 0      CONTINUE these medications which have NOT CHANGED   Details  aspirin EC 81 MG tablet Take 81 mg by mouth daily.    Black Pepper-Turmeric 3-500 MG CAPS Take 2 capsules by mouth daily.    Chromium-Cinnamon (CINNAMON PLUS CHROMIUM PO) Take 1 tablet by mouth 2 (two) times daily. 1000mg  cinnamon/  250mg  chromium    finasteride (PROSCAR) 5 MG tablet Take 5 mg by mouth every evening.     hydrochlorothiazide (HYDRODIURIL) 25 MG tablet Take 12.5 mg by mouth every morning.     losartan (COZAAR) 50 MG tablet Take 50 mg by mouth every evening.     metoprolol tartrate (  LOPRESSOR) 25 MG tablet Take 50 mg by mouth every evening.     Multiple Vitamin (MULTIVITAMIN WITH MINERALS) TABS tablet Take 1 tablet by mouth daily.    Omega-3 Fatty Acids (SUPER OMEGA 3 EPA/DHA) 1000 MG CAPS Take 1 capsule by mouth daily. Omega 3 - DHA - EPA - Fish oil-- 1000mg / 120mg / 180mg     pantoprazole (PROTONIX) 40 MG tablet Take 40 mg by mouth every evening.     phenazopyridine (PYRIDIUM) 200 MG tablet Take 1 tablet (200 mg total) by mouth 3 (three) times daily as needed for pain. Qty: 16 tablet, Refills: 0    tamsulosin (FLOMAX) 0.4 MG CAPS capsule Take 0.4 mg by mouth every evening.           Today   CHIEF COMPLAINT:  Doing well no abd  pain or nausea Ready to go home today   VITAL SIGNS:  Blood pressure (!) 115/52, pulse (!) 58, temperature 97.7 F (36.5 C), temperature source Oral, resp. rate 20, height 5\' 11"  (1.803 m), weight 90.3 kg (199 lb), SpO2 95 %.   REVIEW OF SYSTEMS:  Review of Systems  Constitutional: Negative.  Negative for chills, fever and malaise/fatigue.  HENT: Negative.  Negative for ear discharge, ear pain, hearing loss, nosebleeds and sore throat.   Eyes: Negative.  Negative for blurred vision and pain.  Respiratory: Negative.  Negative for cough, hemoptysis, shortness of breath and wheezing.   Cardiovascular: Negative.  Negative for chest pain, palpitations and leg swelling.  Gastrointestinal: Negative.  Negative for abdominal pain, blood in stool, diarrhea, nausea and vomiting.  Genitourinary: Negative.  Negative for dysuria.  Musculoskeletal: Negative.  Negative for back pain.  Skin: Negative.   Neurological: Negative for dizziness, tremors, speech change, focal weakness, seizures and headaches.  Endo/Heme/Allergies: Negative.  Does not bruise/bleed easily.  Psychiatric/Behavioral: Negative.  Negative for depression, hallucinations and suicidal ideas.     PHYSICAL EXAMINATION:  GENERAL:  83 y.o.-year-old patient lying in the bed with no acute distress.  NECK:  Supple, no jugular venous distention. No thyroid enlargement, no tenderness.  LUNGS: Normal breath sounds bilaterally, no wheezing, rales,rhonchi  No use of accessory muscles of respiration.  CARDIOVASCULAR: S1, S2 normal. No murmurs, rubs, or gallops.  ABDOMEN: Soft, non-tender, non-distended. Bowel sounds present. Large hernia present No organomegaly or mass.  EXTREMITIES: No pedal edema, cyanosis, or clubbing.  PSYCHIATRIC: The patient is alert and oriented x 3.  SKIN: No obvious rash, lesion, or ulcer.   DATA REVIEW:   CBC  Recent Labs Lab 10/29/16 0542  WBC 7.1  HGB 12.2*  HCT 36.3*  PLT 207    Chemistries    Recent Labs Lab 10/28/16 0344 10/29/16 0542  NA 136 138  K 3.6 3.4*  CL 101 104  CO2 26 29  GLUCOSE 232* 154*  BUN 22* 19  CREATININE 0.92 0.76  CALCIUM 9.3 8.0*  MG  --  1.7  AST 32  --   ALT 17  --   ALKPHOS 68  --   BILITOT 0.8  --     Cardiac Enzymes No results for input(s): TROPONINI in the last 168 hours.  Microbiology Results  @MICRORSLT48 @  RADIOLOGY:  No results found.    Current Discharge Medication List    START taking these medications   Details  cephALEXin (KEFLEX) 500 MG capsule Take 1 capsule (500 mg total) by mouth 3 (three) times daily. Qty: 24 capsule, Refills: 0      CONTINUE these medications which  have NOT CHANGED   Details  aspirin EC 81 MG tablet Take 81 mg by mouth daily.    Black Pepper-Turmeric 3-500 MG CAPS Take 2 capsules by mouth daily.    Chromium-Cinnamon (CINNAMON PLUS CHROMIUM PO) Take 1 tablet by mouth 2 (two) times daily. 1000mg  cinnamon/  250mg  chromium    finasteride (PROSCAR) 5 MG tablet Take 5 mg by mouth every evening.     hydrochlorothiazide (HYDRODIURIL) 25 MG tablet Take 12.5 mg by mouth every morning.     losartan (COZAAR) 50 MG tablet Take 50 mg by mouth every evening.     metoprolol tartrate (LOPRESSOR) 25 MG tablet Take 50 mg by mouth every evening.     Multiple Vitamin (MULTIVITAMIN WITH MINERALS) TABS tablet Take 1 tablet by mouth daily.    Omega-3 Fatty Acids (SUPER OMEGA 3 EPA/DHA) 1000 MG CAPS Take 1 capsule by mouth daily. Omega 3 - DHA - EPA - Fish oil-- 1000mg / 120mg / 180mg     pantoprazole (PROTONIX) 40 MG tablet Take 40 mg by mouth every evening.     phenazopyridine (PYRIDIUM) 200 MG tablet Take 1 tablet (200 mg total) by mouth 3 (three) times daily as needed for pain. Qty: 16 tablet, Refills: 0    tamsulosin (FLOMAX) 0.4 MG CAPS capsule Take 0.4 mg by mouth every evening.           Management plans discussed with the patient and he is in agreement. Stable for discharge home  Patient  should follow up with pcp  CODE STATUS:     Code Status Orders        Start     Ordered   10/28/16 1223  Do not attempt resuscitation (DNR)  Continuous    Question Answer Comment  In the event of cardiac or respiratory ARREST Do not call a "code blue"   In the event of cardiac or respiratory ARREST Do not perform Intubation, CPR, defibrillation or ACLS   In the event of cardiac or respiratory ARREST Use medication by any route, position, wound care, and other measures to relive pain and suffering. May use oxygen, suction and manual treatment of airway obstruction as needed for comfort.   Comments nurse may pronounce      10/28/16 1222    Code Status History    Date Active Date Inactive Code Status Order ID Comments User Context   10/07/2016 11:50 PM 10/08/2016  4:32 PM Full Code [id number]  Hugelmeyer, Ubaldo Glassing, DO Inpatient    Advance Directive Documentation     Most Recent Value  Type of Advance Directive  Healthcare Power of [place], Living will  Pre-existing out of facility DNR order (yellow form or pink MOST form)  -  "MOST" Form in Place?  -      TOTAL TIME TAKING CARE OF THIS PATIENT: 37 minutes.    Note: This dictation was prepared with Dragon dictation along with smaller phrase technology. Any transcriptional errors that result from this process are unintentional.  Lusero Nordlund M.D on 10/30/2016 at 6:36 AM  Between 7am to 6pm - Pager - [phone] After 6pm go to [email or web address] - password [email or web address]  Office  [phone]  CC: Primary care physician; Dion Body, MD

## 2016-10-29 NOTE — Progress Notes (Signed)
[facility] NAME: Tyler Pena    MR#:  [id number]  DATE OF BIRTH:  12-21-1920  SUBJECTIVE:   NG tube removed. Patient reports no distention  REVIEW OF SYSTEMS:    Review of Systems  Constitutional: Negative for fever, chills weight loss HENT: Negative for ear pain, nosebleeds, congestion, facial swelling, rhinorrhea, neck pain, neck stiffness and ear discharge.   Respiratory: Negative for cough, shortness of breath, wheezing  Cardiovascular: Negative for chest pain, palpitations and leg swelling.  Gastrointestinal: Negative for heartburn, abdominal pain, vomiting, diarrhea or consitpation Genitourinary: Negative for dysuria, urgency, frequency, hematuria Musculoskeletal: Negative for back pain or joint pain Neurological: Negative for dizziness, seizures, syncope, focal weakness,  numbness and headaches.  Hematological: Does not bruise/bleed easily.  Psychiatric/Behavioral: Negative for hallucinations, confusion, dysphoric mood    Tolerating Diet: Clear liquid      DRUG ALLERGIES:  No Known Allergies  VITALS:  Blood pressure (!) 143/63, pulse 77, temperature 98.1 F (36.7 C), temperature source Oral, resp. rate 18, height 5\' 11"  (1.803 m), weight 90.3 kg (199 lb), SpO2 93 %.  PHYSICAL EXAMINATION:  Constitutional: Appears well-developed and well-nourished. No distress. HENT: Normocephalic. Marland Kitchen Oropharynx is clear and moist.  Eyes: Conjunctivae and EOM are normal. PERRLA, no scleral icterus.  Neck: Normal ROM. Neck supple. No JVD. No tracheal deviation. CVS: RRR, S1/S2 +, no murmurs, no gallops, no carotid bruit.  Pulmonary: Effort and breath sounds normal, no stridor, rhonchi, wheezes, rales.  Abdominal: Soft. BS +,  no distension, tenderness, rebound or guarding.  Large hernia  Musculoskeletal: Normal range of motion. No edema and no tenderness.  Neuro: Alert. CN 2-12 grossly intact. No focal deficits. Skin: Skin is  warm and dry. No rash noted. Psychiatric: Normal mood and affect.      LABORATORY PANEL:   CBC  Recent Labs Lab 10/29/16 0542  WBC 7.1  HGB 12.2*  HCT 36.3*  PLT 207   ------------------------------------------------------------------------------------------------------------------  Chemistries   Recent Labs Lab 10/28/16 0344 10/29/16 0542  NA 136 138  K 3.6 3.4*  CL 101 104  CO2 26 29  GLUCOSE 232* 154*  BUN 22* 19  CREATININE 0.92 0.76  CALCIUM 9.3 8.0*  MG  --  1.7  AST 32  --   ALT 17  --   ALKPHOS 68  --   BILITOT 0.8  --    ------------------------------------------------------------------------------------------------------------------  Cardiac Enzymes No results for input(s): TROPONINI in the last 168 hours. ------------------------------------------------------------------------------------------------------------------  RADIOLOGY:  Dg Abdomen 1 View  Result Date: 10/28/2016 CLINICAL DATA:  Lower abdominal pain with nausea EXAM: ABDOMEN - 1 VIEW COMPARISON:  None. FINDINGS: Visualized lung bases are grossly clear. There is no free air beneath the diaphragm. Postsurgical changes in the upper abdomen. Nonobstructed gas pattern. Status post left hip replacement. No pathologic calcifications. IMPRESSION: Non obstructed bowel gas pattern Electronically Signed   By: Donavan Foil M.D.   On: 10/28/2016 04:09   Ct Angio Abd/pel W And/or Wo Contrast  Result Date: 10/28/2016 CLINICAL DATA:  Initial evaluation for acute lower abdominal pain, nausea. Evaluate for intra-abdominal aneurysm. EXAM: CTA ABDOMEN AND PELVIS WITHOUT AND WITH CONTRAST TECHNIQUE: Multidetector CT imaging of the abdomen and pelvis was performed using the standard protocol during bolus administration of intravenous contrast. Multiplanar reconstructed images and MIPs were obtained and reviewed to evaluate the vascular anatomy. CONTRAST:  125 cc of Isovue 370. COMPARISON:  Comparison made with prior  radiograph from earlier the  same day. FINDINGS: VASCULAR Aorta: Moderate calcified and noncalcified plaque present throughout the intra-abdominal aorta. Focal ectasia of the infrarenal aorta up to 2.7 cm in diameter (series 4, image 107). No frank aneurysm. No evidence for aortic dissection or other acute abnormality. Celiac: Focal plaque at the origin of the celiac axis with mild luminal stenosis. Celiac artery and its branch vessels are well opacified distally. Splenic artery tortuous with multifocal atheromatous plaque. SMA: SMA widely patent at its origin and is widely patent distally. Renals: Single renal arteries present bilaterally, both of which are widely patent. IMA: IMA widely patent at its origin and well opacified distally. Inflow: Bilateral common, external, and internal iliac artery these are widely patent bilaterally. Mild scattered atheromatous plaque without flow-limiting stenosis. Proximal Outflow: Visualized major arteries of the upper thighs widely patent Veins: No acute venous abnormality. Review of the MIP images confirms the above findings. NON-VASCULAR Lower chest: Mild subsegmental atelectasis present within the visualized lung bases. Visualized lungs are otherwise clear. No pleural effusion. Trace pericardial effusion noted. Hepatobiliary: The liver demonstrates a normal contrast enhanced appearance. Gallbladder surgically absent. No biliary dilatation. Pancreas: Pancreas within normal limits. Spleen: Spleen within normal limits. Adrenals/Urinary Tract: Adrenal glands are normal. Kidneys equal in size with symmetric enhancement. Scattered renal cysts present bilaterally. No nephrolithiasis, hydronephrosis, or focal enhancing renal mass. No hydroureter. Bladder partially distended and within normal limits. Stomach/Bowel: Small hiatal hernia with fluid in the distal esophageal lumen. Stomach moderately distended with fluid within the gastric lumen. There are multiple dilated fluid-filled  loops of small bowel clustered within the mid and lower abdomen. Few scattered internal air-fluid levels. These measure up to 4 cm in diameter. There is an apparent transition point within the mid/right lower abdomen (series 5, image 69). Somewhat finding suggestive of mechanical small bowel obstruction underlying adhesive disease is suspected. Ileum is decompressed distally to the ileocecal valve. Colon of normal caliber. Colonic diverticulosis without evidence for acute diverticulitis. Lymphatic: No pathologically enlarged intra-abdominal or pelvic lymph nodes. Reproductive: Prostate is enlarged measuring 6.1 cm in transverse diameter, and invaginates on the base of the bladder. Other: No free air. Trace scattered free fluid within the lower abdomen, likely reactive. Sequela prior hernia repair noted. Bilateral fat containing inguinal hernias noted, left larger than right. Musculoskeletal: Left total hip arthroplasty in place. No acute osseus abnormality. No worrisome lytic or blastic osseous lesions. Trace retrolisthesis of L2 on L3 noted. IMPRESSION: VASCULAR 1. No acute vascular abnormality identified within the abdomen and pelvis. 2. 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 Recommend followup by ultrasound in 5 years. This recommendation follows ACR consensus guidelines: White Paper of the ACR Incidental Findings Committee II on Vascular Findings. J Am Coll Radiol 2013; 10:789-794. 3. Mild scattered atheromatous plaque involving the major arterial vasculature of the abdomen and pelvis as above. NON-VASCULAR 1. Findings concerning for small bowel obstruction with transition point within the lower mid/right abdomen as above. Underlying adhesive disease is suspected. 2. No other acute intra-abdominal or pelvic process. 3. Colonic diverticulosis without evidence for acute diverticulitis. 4. Enlarged prostate. 5. Small hiatal hernia. Results discussed by  telephone at the time of interpretation on 10/28/2016 at 6:43 am to Dr. Marjean Donna , who verbally acknowledged these results. Electronically Signed   By: Jeannine Boga M.D.   On: 10/28/2016 06:54     ASSESSMENT AND PLAN:   83 year old male with history of small bowel obstruction due to adhesions he comes  in with partial small bowel obstruction  1. Partial small bowel obstruction which has resolved with NG tube decompression Advance diet as tolerated Surgical consultation is appreciated  2. BPH: Patient may resume finasteride and tamsulosin  3. Essential hypertension: Continue metoprolol, losartan and HCTZ  4. GERD: On PPI  Management plans discussed with the patient and he is in agreement.  CODE STATUS: dnr  TOTAL TIME TAKING CARE OF THIS PATIENT: 30 minutes.     POSSIBLE D/C today, DEPENDING if he tolerates diet   Raymel Cull M.D on 10/29/2016 at 9:39 AM  Between 7am to 6pm - Pager - [phone] After 6pm go to [email or web address] - password [email or web address] [facility]  Office  [phone]  CC: Primary care physician; Dion Body, MD  Note: This dictation was prepared with Dragon dictation along with smaller phrase technology. Any transcriptional errors that result from this process are unintentional.

## 2016-10-29 NOTE — Progress Notes (Signed)
Nasogastric tube has been removed by Dr. Rosana Hoes. Patient has requested to have the bed alarm turned off.  He remains alert, oriented and steady on his feet. RN complied with request.

## 2016-10-30 DIAGNOSIS — K56600 Partial intestinal obstruction, unspecified as to cause: Secondary | ICD-10-CM

## 2016-10-30 LAB — GLUCOSE, CAPILLARY: Glucose-Capillary: 126 mg/dL — ABNORMAL HIGH (ref 65–99)

## 2016-10-30 MED ORDER — CIPROFLOXACIN HCL 500 MG PO TABS: 500.0000 mg | ORAL_TABLET | Freq: Two times a day (BID) | ORAL | Status: DC

## 2016-10-30 MED ORDER — DOCUSATE SODIUM 100 MG PO CAPS
200.0000 mg | ORAL_CAPSULE | Freq: Two times a day (BID) | ORAL | Status: DC
  Administered 2016-10-30: 200 mg via ORAL
  Filled 2016-10-30: qty 2

## 2016-10-30 NOTE — Progress Notes (Signed)
PHARMACIST - PHYSICIAN COMMUNICATION DR:   Leslye Peer CONCERNING: Antibiotic IV to Oral Route Change Policy  RECOMMENDATION: This patient is receiving Cipro  by the intravenous route.  Based on criteria approved by the Pharmacy and Therapeutics Committee, the antibiotic(s) is/are being converted to the equivalent oral dose form(s).   DESCRIPTION: These criteria include:  Patient being treated for a respiratory tract infection, urinary tract infection, cellulitis or clostridium difficile associated diarrhea if on metronidazole  The patient is not neutropenic and does not exhibit a GI malabsorption state  The patient is eating (either orally or via tube) and/or has been taking other orally administered medications for a least 24 hours  The patient is improving clinically and has a Tmax < 100.5  If you have questions about this conversion, please contact the Pharmacy Department  []   [phone] )  [facility] []   [phone] )  [facility] []   [phone] )  [facility] []   [phone] )  [facility] []   [phone] )  [facility]

## 2016-10-30 NOTE — Progress Notes (Signed)
Discharge instructions reviewed with the patient.  rx given to the patient.  Patient sent out via wheelchair with belongings to his grandsons waiting car

## 2016-10-30 NOTE — Progress Notes (Signed)
SURGICAL PROGRESS NOTE (cpt [phone])  Hospital Day(s): 2.   Post op day(s):  Marland Kitchen   Interval History: Patient seen and examined, no acute events or new complaints overnight. Patient reports complete resolution of all abdominal pain and N/V for which he presented, +flatus and +BM (large), tolerating regular diet and ambulating without difficulty, denies fever/chills, CP, or SOB.  Review of Systems:  Constitutional: denies fever, chills  HEENT: denies cough or congestion  Respiratory: denies any shortness of breath  Cardiovascular: denies chest pain or palpitations  Gastrointestinal: abdominal pain, N/V, and bowel function as per interval history Genitourinary: denies burning with urination or urinary frequency Musculoskeletal: denies pain, decreased motor or sensation Integumentary: denies any other rashes or skin discolorations Neurological: denies HA or vision/hearing changes   Vital signs in last 24 hours: [min-max] current  Temp:  [97.7 F (36.5 C)-98.5 F (36.9 C)] 97.7 F (36.5 C) (05/06 0443) Pulse Rate:  [58-76] 58 (05/06 0443) Resp:  [20] 20 (05/06 0443) BP: (115-130)/(52-70) 115/52 (05/06 0443) SpO2:  [95 %-98 %] 95 % (05/06 0443)     Height: 5\' 11"  (180.3 cm) Weight: 199 lb (90.3 kg) BMI (Calculated): 27.8   Intake/Output this shift:  No intake/output data recorded.   Intake/Output last 2 shifts:  @IOLAST2SHIFTS @   Physical Exam:  Constitutional: alert, cooperative and no distress  HENT: normocephalic without obvious abnormality  Eyes: PERRL, EOM's grossly intact and symmetric  Neuro: CN II - XII grossly intact and symmetric without deficit  Respiratory: breathing non-labored at rest  Cardiovascular: regular rate and sinus rhythm  Gastrointestinal: soft, non-tender, and non-distended, well-healed prior surgical incisions with palpable upper abdominal wall ventral hernia mesh Musculoskeletal: UE and LE FROM, no edema or wounds, motor and sensation grossly intact, NT    Labs:  CBC Latest Ref Rng & Units 10/29/2016 10/28/2016 10/07/2016  WBC 3.8 - 10.6 K/uL 7.1 13.8(H) 9.5  Hemoglobin 13.0 - 18.0 g/dL 12.2(L) 13.8 13.9  Hematocrit 40.0 - 52.0 % 36.3(L) 39.9(L) 40.8  Platelets 150 - 440 K/uL 207 268 206   CMP Latest Ref Rng & Units 10/29/2016 10/28/2016 10/07/2016  Glucose 65 - 99 mg/dL 154(H) 232(H) 187(H)  BUN 6 - 20 mg/dL 19 22(H) 17  Creatinine 0.61 - 1.24 mg/dL 0.76 0.92 0.88  Sodium 135 - 145 mmol/L 138 136 134(L)  Potassium 3.5 - 5.1 mmol/L 3.4(L) 3.6 3.3(L)  Chloride 101 - 111 mmol/L 104 101 99(L)  CO2 22 - 32 mmol/L 29 26 28   Calcium 8.9 - 10.3 mg/dL 8.0(L) 9.3 8.9  Total Protein 6.5 - 8.1 g/dL - 7.6 -  Total Bilirubin 0.3 - 1.2 mg/dL - 0.8 -  Alkaline Phos 38 - 126 U/L - 68 -  AST 15 - 41 U/L - 32 -  ALT 17 - 63 U/L - 17 -    Imaging studies: No new pertinent imaging studies  Assessment/Plan: (ICD-10's: K56.51) 83 y.o.malewith likely partial small bowel obstruction (considering passage of flatus and BM while in ED), likely attributable to post-surgical adhesions following multiple prior open abdominal surgeries, complicated by pertinent comorbidities including diet-controlled DM, HTN, HLD, CAD s/p PCI, history of multiply recurrent melanomas and bladder cancer, BPH, nephrolithiasis, GERD with history of PUD and hiatal hernia, osteoarthritis, and hearing loss.  - slowly continue advancing to heart healthy diet as tolerated  - patient instructed to call office or return to ED if symptoms recur/worsen             - medical management of comorbidities  as per primary medical team  - agree with discharge home today as planned - ambulation encouraged  Thank you for the opportunity to participate in this patient's care.   -- Marilynne Drivers Rosana Hoes, MD, [place]: [facility] - Partnering for exceptional care. Office: [phone]

## 2016-10-30 NOTE — Progress Notes (Signed)
Pt ambulated multiple times in hallway around nurses station throughout shift. Pt passed lots of gas throughout the night and states that he is feeling much better this morning than he did last night.

## 2016-11-02 LAB — CULTURE, BLOOD (ROUTINE X 2)
CULTURE: NO GROWTH
Culture: NO GROWTH
Special Requests: ADEQUATE

## 2016-11-07 ENCOUNTER — Emergency Department: Payer: Medicare Other

## 2016-11-07 ENCOUNTER — Inpatient Hospital Stay
Admission: EM | Admit: 2016-11-07 | Discharge: 2016-11-09 | DRG: 389 | Disposition: A | Discharge status: Home / Self Care | Payer: Medicare Other | Attending: Internal Medicine | Admitting: Internal Medicine

## 2016-11-07 ENCOUNTER — Encounter: Payer: Self-pay | Admitting: Radiology

## 2016-11-07 ENCOUNTER — Inpatient Hospital Stay: Payer: Medicare Other

## 2016-11-07 DIAGNOSIS — M1990 Unspecified osteoarthritis, unspecified site: Secondary | ICD-10-CM | POA: Diagnosis present

## 2016-11-07 DIAGNOSIS — K56600 Partial intestinal obstruction, unspecified as to cause: Secondary | ICD-10-CM | POA: Diagnosis present

## 2016-11-07 DIAGNOSIS — K5651 Intestinal adhesions [bands], with partial obstruction: Secondary | ICD-10-CM

## 2016-11-07 DIAGNOSIS — Z66 Do not resuscitate: Secondary | ICD-10-CM | POA: Diagnosis present

## 2016-11-07 DIAGNOSIS — Z7982 Long term (current) use of aspirin: Secondary | ICD-10-CM

## 2016-11-07 DIAGNOSIS — N400 Enlarged prostate without lower urinary tract symptoms: Secondary | ICD-10-CM | POA: Diagnosis present

## 2016-11-07 DIAGNOSIS — K449 Diaphragmatic hernia without obstruction or gangrene: Secondary | ICD-10-CM | POA: Diagnosis present

## 2016-11-07 DIAGNOSIS — Z79899 Other long term (current) drug therapy: Secondary | ICD-10-CM

## 2016-11-07 DIAGNOSIS — Z96642 Presence of left artificial hip joint: Secondary | ICD-10-CM | POA: Diagnosis present

## 2016-11-07 DIAGNOSIS — N390 Urinary tract infection, site not specified: Secondary | ICD-10-CM | POA: Diagnosis present

## 2016-11-07 DIAGNOSIS — Z8711 Personal history of peptic ulcer disease: Secondary | ICD-10-CM | POA: Diagnosis not present

## 2016-11-07 DIAGNOSIS — K219 Gastro-esophageal reflux disease without esophagitis: Secondary | ICD-10-CM | POA: Diagnosis present

## 2016-11-07 DIAGNOSIS — I10 Essential (primary) hypertension: Secondary | ICD-10-CM | POA: Diagnosis present

## 2016-11-07 DIAGNOSIS — Z85820 Personal history of malignant melanoma of skin: Secondary | ICD-10-CM | POA: Diagnosis not present

## 2016-11-07 DIAGNOSIS — Z8249 Family history of ischemic heart disease and other diseases of the circulatory system: Secondary | ICD-10-CM | POA: Diagnosis not present

## 2016-11-07 DIAGNOSIS — K469 Unspecified abdominal hernia without obstruction or gangrene: Secondary | ICD-10-CM | POA: Diagnosis present

## 2016-11-07 DIAGNOSIS — Z955 Presence of coronary angioplasty implant and graft: Secondary | ICD-10-CM | POA: Diagnosis not present

## 2016-11-07 DIAGNOSIS — K660 Peritoneal adhesions (postprocedural) (postinfection): Secondary | ICD-10-CM | POA: Diagnosis present

## 2016-11-07 DIAGNOSIS — E119 Type 2 diabetes mellitus without complications: Secondary | ICD-10-CM | POA: Diagnosis present

## 2016-11-07 DIAGNOSIS — Z8551 Personal history of malignant neoplasm of bladder: Secondary | ICD-10-CM

## 2016-11-07 DIAGNOSIS — E785 Hyperlipidemia, unspecified: Secondary | ICD-10-CM | POA: Diagnosis present

## 2016-11-07 DIAGNOSIS — I2510 Atherosclerotic heart disease of native coronary artery without angina pectoris: Secondary | ICD-10-CM | POA: Diagnosis present

## 2016-11-07 DIAGNOSIS — K56609 Unspecified intestinal obstruction, unspecified as to partial versus complete obstruction: Secondary | ICD-10-CM | POA: Diagnosis present

## 2016-11-07 DIAGNOSIS — I252 Old myocardial infarction: Secondary | ICD-10-CM

## 2016-11-07 HISTORY — DX: Unspecified intestinal obstruction, unspecified as to partial versus complete obstruction: K56.609

## 2016-11-07 LAB — CBC
HCT: 41.1 % (ref 40.0–52.0)
HEMOGLOBIN: 14.1 g/dL (ref 13.0–18.0)
MCH: 29.9 pg (ref 26.0–34.0)
MCHC: 34.2 g/dL (ref 32.0–36.0)
MCV: 87.2 fL (ref 80.0–100.0)
Platelets: 231 10*3/uL (ref 150–440)
RBC: 4.71 MIL/uL (ref 4.40–5.90)
RDW: 14.7 % — ABNORMAL HIGH (ref 11.5–14.5)
WBC: 12.6 10*3/uL — ABNORMAL HIGH (ref 3.8–10.6)

## 2016-11-07 LAB — COMPREHENSIVE METABOLIC PANEL
ALBUMIN: 4 g/dL (ref 3.5–5.0)
ALK PHOS: 62 U/L (ref 38–126)
ALT: 15 U/L — AB (ref 17–63)
AST: 22 U/L (ref 15–41)
Anion gap: 11 (ref 5–15)
BUN: 22 mg/dL — ABNORMAL HIGH (ref 6–20)
CALCIUM: 9.5 mg/dL (ref 8.9–10.3)
CO2: 28 mmol/L (ref 22–32)
CREATININE: 1.06 mg/dL (ref 0.61–1.24)
Chloride: 96 mmol/L — ABNORMAL LOW (ref 101–111)
GFR calc Af Amer: >60 mL/min (ref >60)
GFR calc non Af Amer: 58 mL/min — ABNORMAL LOW (ref >60)
GLUCOSE: 200 mg/dL — AB (ref 65–99)
Potassium: 3.6 mmol/L (ref 3.5–5.1)
SODIUM: 135 mmol/L (ref 135–145)
Total Bilirubin: 0.8 mg/dL (ref 0.3–1.2)
Total Protein: 7.4 g/dL (ref 6.5–8.1)

## 2016-11-07 LAB — LIPASE, BLOOD: Lipase: 18 U/L (ref 11–51)

## 2016-11-07 LAB — GLUCOSE, CAPILLARY
GLUCOSE-CAPILLARY: 116 mg/dL — AB (ref 65–99)
Glucose-Capillary: 120 mg/dL — ABNORMAL HIGH (ref 65–99)

## 2016-11-07 LAB — LACTIC ACID, PLASMA: Lactic Acid, Venous: 1.2 mmol/L (ref 0.5–1.9)

## 2016-11-07 LAB — TROPONIN I: Troponin I: <0.03 ng/mL (ref <0.03)

## 2016-11-07 MED ORDER — ONDANSETRON HCL 4 MG PO TABS: 4.0000 mg | ORAL_TABLET | Freq: Four times a day (QID) | ORAL | Status: DC | PRN

## 2016-11-07 MED ORDER — IOPAMIDOL (ISOVUE-300) INJECTION 61%
30.0000 mL | Freq: Once | INTRAVENOUS | Status: AC
  Administered 2016-11-07: 30 mL via ORAL

## 2016-11-07 MED ORDER — MORPHINE SULFATE (PF) 4 MG/ML IV SOLN
INTRAVENOUS | Status: AC
  Filled 2016-11-07: qty 1

## 2016-11-07 MED ORDER — BISACODYL 10 MG RE SUPP
10.0000 mg | Freq: Once | RECTAL | Status: AC
  Administered 2016-11-07: 10 mg via RECTAL
  Filled 2016-11-07: qty 1

## 2016-11-07 MED ORDER — INSULIN ASPART 100 UNIT/ML ~~LOC~~ SOLN
0.0000 [IU] | Freq: Three times a day (TID) | SUBCUTANEOUS | Status: DC
  Administered 2016-11-08: 1 [IU] via SUBCUTANEOUS
  Administered 2016-11-08: 2 [IU] via SUBCUTANEOUS
  Filled 2016-11-07: qty 1
  Filled 2016-11-07: qty 2

## 2016-11-07 MED ORDER — ACETAMINOPHEN 650 MG RE SUPP: 650.0000 mg | Freq: Four times a day (QID) | RECTAL | Status: DC | PRN

## 2016-11-07 MED ORDER — MORPHINE SULFATE (PF) 4 MG/ML IV SOLN
INTRAVENOUS | Status: AC
  Administered 2016-11-07: 4 mg via INTRAVENOUS
  Filled 2016-11-07: qty 1

## 2016-11-07 MED ORDER — MORPHINE SULFATE (PF) 4 MG/ML IV SOLN
4.0000 mg | Freq: Once | INTRAVENOUS | Status: AC
  Administered 2016-11-07: 4 mg via INTRAVENOUS

## 2016-11-07 MED ORDER — SODIUM CHLORIDE 0.9 % IV BOLUS (SEPSIS)
500.0000 mL | Freq: Once | INTRAVENOUS | Status: AC
  Administered 2016-11-07: 500 mL via INTRAVENOUS

## 2016-11-07 MED ORDER — ACETAMINOPHEN 325 MG PO TABS: 650.0000 mg | ORAL_TABLET | Freq: Four times a day (QID) | ORAL | Status: DC | PRN

## 2016-11-07 MED ORDER — IOPAMIDOL (ISOVUE-300) INJECTION 61%
100.0000 mL | Freq: Once | INTRAVENOUS | Status: AC | PRN
  Administered 2016-11-07: 100 mL via INTRAVENOUS

## 2016-11-07 MED ORDER — ONDANSETRON HCL 4 MG/2ML IJ SOLN
4.0000 mg | Freq: Once | INTRAMUSCULAR | Status: AC
Start: 2016-11-07 — End: 2016-11-07
  Administered 2016-11-07: 4 mg via INTRAVENOUS

## 2016-11-07 MED ORDER — POTASSIUM CHLORIDE IN NACL 20-0.9 MEQ/L-% IV SOLN
INTRAVENOUS | Status: DC
  Administered 2016-11-07: 1000 mL via INTRAVENOUS
  Administered 2016-11-07 – 2016-11-08 (×2): via INTRAVENOUS
  Filled 2016-11-07 (×5): qty 1000

## 2016-11-07 MED ORDER — ONDANSETRON HCL 4 MG/2ML IJ SOLN: 4.0000 mg | Freq: Four times a day (QID) | INTRAMUSCULAR | Status: DC | PRN

## 2016-11-07 MED ORDER — ONDANSETRON HCL 4 MG/2ML IJ SOLN
INTRAMUSCULAR | Status: AC
  Filled 2016-11-07: qty 2

## 2016-11-07 NOTE — Consult Note (Signed)
Surgical Consultation  11/07/2016  Tyler Pena is an 83 y.o. male.   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 He presents with recurrent bowel obstructions. He was here last week and actually came in and asked for a nasogastric tube knowing that that is what he needed to alleviate his symptoms. This time he came in with a slowly progressive distention nausea he vomited one time yesterday has not had any fevers or chills and today he is started passing gas again and feels much better than he did yesterday he's had minimal out of his nasogastric tube and has no pain this afternoon.  He has extensive abdominal surgical history including open cholecystectomy ventral hernia repairs inguinal hernia repairs mesh placement for ventral hernia. 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 The most recent was done in [place] by a surgeon who performed laparoscopically but said that if this happened again it needed to be done open.  He has had recent treatment for bladder cancer he states that he may not want any sort of aggressive measures at this point and at his age of 39.  Past Medical History:  Diagnosis Date  . Arthritis   . Bilateral renal cysts   . Bladder cancer [facility])    urologist-  dr Karsten Ro  . BPH (benign prostatic hyperplasia)   . Coronary artery disease    hx PCI w/ stenting in 10/ 2014 in [place]--- recently moved from [place] jan 2018 has not established a cardiologist yet but does have pcp   . Dyslipidemia   . Essential hypertension   . GERD (gastroesophageal reflux disease)   . Hematuria   . History of gastric ulcer   . History of hiatal hernia   . History of kidney stones   . History of melanoma in situ    several Excision's in situ and malignant melanoma's--  last one 06/ 2017 top of ear excision Stage 1 w/ negative margins/  other have been right upper arm,  shoulder, chest, face  . History of MI (myocardial infarction)    10/ 2014-- s/p  PCI and stenting x2  . History of small bowel obstruction    fall 2017  s/p  abdominal lysis adhesions  (prior hx bowel obstruction total 8 times)  . Nocturia   . S/P right coronary artery (RCA) stent placement    10/ 2014  x2  . Type 2 diabetes, diet controlled ([place])   . Wears glasses   . Wears hearing aid    bilateral    Past Surgical History:  Procedure Laterality Date  . ABDOMINAL HERNIA REPAIR  2012  . ADDOMINAL LYSIS ADHESIONS  fall 2017   [facility]  . CHOLECYSTECTOMY OPEN  1971   and Appendectomy  . CORONARY ANGIOPLASTY WITH STENT PLACEMENT  04-17-2013  in [place]   x2 stents to RCA  . INGUINAL HERNIA REPAIR Bilateral 1995  . TOTAL HIP ARTHROPLASTY Left 2008  . TRANSURETHRAL RESECTION OF BLADDER TUMOR  09/2006   LEIOMYOMA   . TRANSURETHRAL RESECTION OF BLADDER TUMOR WITH MITOMYCIN-C N/A 09/02/2016   Procedure: TRANSURETHRAL RESECTION OF BLADDER TUMOR WITH MITOMYCIN-C;  Surgeon: Kathie Rhodes, MD;  Location: [facility];  Service: Urology;  Laterality: N/A;    Family History  Problem Relation Age of Onset  . Cancer Mother   . CAD Father   . CAD Sister   .  CAD Sister   . CAD Sister     Social History:  reports that he has never smoked. He has never used smokeless tobacco. He reports that he drinks alcohol. He reports that he does not use drugs.  Allergies: No Known Allergies  Medications reviewed.   Review of Systems:   Review of Systems  Constitutional: Negative.   HENT: Negative.   Eyes: Negative.   Respiratory: Negative.   Cardiovascular: Negative.   Gastrointestinal: Positive for abdominal pain, nausea and vomiting. Negative for blood in stool, constipation, diarrhea and heartburn.  Genitourinary: Negative.   Musculoskeletal: Negative.   Skin: Negative.   Neurological: Negative.   Endo/Heme/Allergies: Negative.   Psychiatric/Behavioral: Negative.       Physical Exam:  BP 128/72 (BP Location: Left Arm)   Pulse 73   Temp 97.9 F (36.6 C) (Oral)   Resp 19   Ht '5\' 11"'  (1.803 m)   Wt 199 lb (90.3 kg)   SpO2 99%   BMI 27.75 kg/m   Physical Exam  Constitutional: He is oriented to person, place, and time and well-developed, well-nourished, and in no distress. No distress.  HENT:  Head: Normocephalic and atraumatic.  Eyes: Pupils are equal, round, and reactive to light. Right eye exhibits no discharge. Left eye exhibits no discharge. No scleral icterus.  Neck: No JVD present. No tracheal deviation present.  Pulmonary/Chest: Effort normal. No stridor. No respiratory distress.  Abdominal: Soft. He exhibits distension. There is no tenderness. There is no rebound and no guarding.  Multiple scars present fairly markedly distended. Nontender. Tympanitic.  Musculoskeletal: Normal range of motion. He exhibits no edema.  Neurological: He is alert and oriented to person, place, and time.  Skin: Skin is warm and dry. No rash noted. He is not diaphoretic. No erythema.  Psychiatric: Mood and affect normal.  Vitals reviewed.     Results for orders placed or performed during the hospital encounter of 11/07/16 (from the past 48 hour(s))  CBC     Status: Abnormal   Collection Time: 11/07/16 12:30 AM  Result Value Ref Range   WBC 12.6 (H) 3.8 - 10.6 K/uL   RBC 4.71 4.40 - 5.90 MIL/uL   Hemoglobin 14.1 13.0 - 18.0 g/dL   HCT 41.1 40.0 - 52.0 %   MCV 87.2 80.0 - 100.0 fL   MCH 29.9 26.0 - 34.0 pg   MCHC 34.2 32.0 - 36.0 g/dL   RDW 14.7 (H) 11.5 - 14.5 %   Platelets 231 150 - 440 K/uL  Comprehensive metabolic panel     Status: Abnormal   Collection Time: 11/07/16 12:30 AM  Result Value Ref Range   Sodium 135 135 - 145 mmol/L   Potassium 3.6 3.5 - 5.1 mmol/L   Chloride 96 (L) 101 - 111 mmol/L   CO2 28 22 - 32 mmol/L   Glucose, Bld 200 (H) 65 - 99 mg/dL   BUN 22 (H) 6 - 20 mg/dL   Creatinine, Ser 1.06 0.61 - 1.24 mg/dL   Calcium 9.5  8.9 - 10.3 mg/dL   Total Protein 7.4 6.5 - 8.1 g/dL   Albumin 4.0 3.5 - 5.0 g/dL   AST 22 15 - 41 U/L   ALT 15 (L) 17 - 63 U/L   Alkaline Phosphatase 62 38 - 126 U/L   Total Bilirubin 0.8 0.3 - 1.2 mg/dL   GFR calc non Af Amer 58 (L) >60 mL/min   GFR calc Af Amer >60 >60 mL/min    Comment: (  NOTE) The eGFR has been calculated using the CKD EPI equation. This calculation has not been validated in all clinical situations. eGFR's persistently <60 mL/min signify possible Chronic Kidney Disease.    Anion gap 11 5 - 15  Lactic acid, plasma     Status: None   Collection Time: 11/07/16 12:30 AM  Result Value Ref Range   Lactic Acid, Venous 1.2 0.5 - 1.9 mmol/L  Troponin I     Status: None   Collection Time: 11/07/16 12:30 AM  Result Value Ref Range   Troponin I <0.03 <0.03 ng/mL  Lipase, blood     Status: None   Collection Time: 11/07/16 12:30 AM  Result Value Ref Range   Lipase 18 11 - 51 U/L   Ct Abdomen Pelvis W Contrast  Result Date: 11/07/2016 CLINICAL DATA:  83 year old with abdominal pain, nausea and vomiting. Concern for small bowel obstruction. EXAM: CT ABDOMEN AND PELVIS WITH CONTRAST TECHNIQUE: Multidetector CT imaging of the abdomen and pelvis was performed using the standard protocol following bolus administration of intravenous contrast. CONTRAST:  153m ISOVUE-300 IOPAMIDOL (ISOVUE-300) INJECTION 61% COMPARISON:  CT angiography 10/28/2016 FINDINGS: Lower chest: Lung bases are clear. No pleural fluid. Small to moderate hiatal hernia. Hepatobiliary: Postcholecystectomy with intra and extrahepatic biliary prominence, common bile duct measures 14 mm. No focal hepatic lesion. Pancreas: Parenchymal atrophy. No ductal dilatation or inflammation. Spleen: Normal in size without focal abnormality. Adrenals/Urinary Tract: No adrenal nodule. No hydronephrosis or perinephric edema. Bilateral renal cysts are stable from prior exam. Ureters are decompressed. Urinary bladder is physiologically  distended. Stomach/Bowel: Small hiatal hernia distended with enteric contrast. Gastric distention with enteric contrast. Dilated fluid-filled small bowel measures at 4.2 cm. Apparent transition in the right lower quadrant, image 60 series 2, appears similar to prior exam. There is mild mesenteric edema in the right lower quadrant mesentery. No pneumatosis. Moderate stool burden throughout the colon. Advanced diverticulosis involving the descending and sigmoid colon without acute diverticulitis. Vascular/Lymphatic: Abdominal aortic atherosclerosis, maximal dimension 2.7 cm. No dissection. No adenopathy. Reproductive: Prostate appears enlarged, partially obscured by a artifact from left hip prosthesis. Other: Small volume mesenteric free fluid. No frank ascites. No intra-abdominal abscess. Tacks from prior anterior abdominal wall hernia repair in the anterior abdomen. No free air. Minimal fat in the inguinal canals. Musculoskeletal: Left hip arthroplasty. Degenerative change in the spine. There are no acute or suspicious osseous abnormalities. IMPRESSION: 1. Findings concerning for recurrent partial small bowel obstruction with transition point in the right lower quadrant. This is likely secondary to adhesions. 2. Distal colonic diverticulosis without acute inflammation. 3. Aortic atherosclerosis. Electronically Signed   By: MJeb LeveringM.D.   On: 11/07/2016 04:00   Dg Abd Portable 1 View  Result Date: 11/07/2016 CLINICAL DATA:  83y/o  M; enteric tube placement. EXAM: PORTABLE ABDOMEN - 1 VIEW COMPARISON:  11/07/2016 CT abdomen and pelvis. FINDINGS: Normal bowel gas pattern. Enteric tube tip projects over the mid abdomen. Moderate multilevel degenerative changes of the spine. Contrast excretion into the kidneys from prior CT of abdomen and pelvis. IMPRESSION: Enteric tube tip projects over the mid stomach. Proximal side hole is at the gastroesophageal junction, consider advancement. Electronically Signed    By: LKristine GarbeM.D.   On: 11/07/2016 05:07    Assessment/Plan:  Abdominal films and CT scan personally reviewed as are his labs. This a patient with a history of recurrent bowel obstruction he is retired pEngineer, drilling He has had his nasogastric tube in place and is  now starting to pass gas. I will repeat films this afternoon and assess improvement or progression. This is discussed with the patient who is a retired Engineer, drilling and a personal experience definitely understands his condition and the prognosis for recurrence etc.  Florene Glen, MD, FACS

## 2016-11-07 NOTE — ED Triage Notes (Signed)
Pt here with possible bowel obstruction, pt has a hx, states that the pain started a little over an hour ago, pt is writhing and moaning in pain

## 2016-11-07 NOTE — Progress Notes (Addendum)
[facility] NAME: Tyler Pena    MR#:  [id number]  DATE OF BIRTH:  03-03-1921  SUBJECTIVE:  Came in with nausea and vomiting. NG+  REVIEW OF SYSTEMS:   Review of Systems  Constitutional: Negative for chills, fever and weight loss.  HENT: Negative for ear discharge, ear pain and nosebleeds.   Eyes: Negative for blurred vision, pain and discharge.  Respiratory: Negative for sputum production, shortness of breath, wheezing and stridor.   Cardiovascular: Negative for chest pain, palpitations, orthopnea and PND.  Gastrointestinal: Negative for abdominal pain, diarrhea, nausea and vomiting.  Genitourinary: Negative for frequency and urgency.  Musculoskeletal: Negative for back pain and joint pain.  Neurological: Positive for weakness. Negative for sensory change, speech change and focal weakness.  Psychiatric/Behavioral: Negative for depression and hallucinations. The patient is not nervous/anxious.    Tolerating Diet:CLD Tolerating PT: ambulatory  DRUG ALLERGIES:  No Known Allergies  VITALS:  Blood pressure 128/72, pulse 73, temperature 97.9 F (36.6 C), temperature source Oral, resp. rate 19, height 5\' 11"  (1.803 m), weight 90.3 kg (199 lb), SpO2 99 %.  PHYSICAL EXAMINATION:   Physical Exam  GENERAL:  83 y.o.-year-old patient lying in the bed with no acute distress.  EYES: Pupils equal, round, reactive to light and accommodation. No scleral icterus. Extraocular muscles intact.  HEENT: Head atraumatic, normocephalic. Oropharynx and nasopharynx clear. NG+ NECK:  Supple, no jugular venous distention. No thyroid enlargement, no tenderness.  LUNGS: Normal breath sounds bilaterally, no wheezing, rales, rhonchi. No use of accessory muscles of respiration.  CARDIOVASCULAR: S1, S2 normal. No murmurs, rubs, or gallops.  ABDOMEN: Soft, nontender, nondistended. Few Bowel sounds present. No organomegaly or mass.  EXTREMITIES: No  cyanosis, clubbing or edema b/l.    NEUROLOGIC: Cranial nerves II through XII are intact. No focal Motor or sensory deficits b/l.   PSYCHIATRIC:  patient is alert and oriented x 3.  SKIN: No obvious rash, lesion, or ulcer.   LABORATORY PANEL:  CBC  Recent Labs Lab 11/07/16 0030  WBC 12.6*  HGB 14.1  HCT 41.1  PLT 231    Chemistries   Recent Labs Lab 11/07/16 0030  NA 135  K 3.6  CL 96*  CO2 28  GLUCOSE 200*  BUN 22*  CREATININE 1.06  CALCIUM 9.5  AST 22  ALT 15*  ALKPHOS 62  BILITOT 0.8   Cardiac Enzymes  Recent Labs Lab 11/07/16 0030  TROPONINI <0.03   RADIOLOGY:  Ct Abdomen Pelvis W Contrast  Result Date: 11/07/2016 CLINICAL DATA:  83 year old with abdominal pain, nausea and vomiting. Concern for small bowel obstruction. EXAM: CT ABDOMEN AND PELVIS WITH CONTRAST TECHNIQUE: Multidetector CT imaging of the abdomen and pelvis was performed using the standard protocol following bolus administration of intravenous contrast. CONTRAST:  133mL ISOVUE-300 IOPAMIDOL (ISOVUE-300) INJECTION 61% COMPARISON:  CT angiography 10/28/2016 FINDINGS: Lower chest: Lung bases are clear. No pleural fluid. Small to moderate hiatal hernia. Hepatobiliary: Postcholecystectomy with intra and extrahepatic biliary prominence, common bile duct measures 14 mm. No focal hepatic lesion. Pancreas: Parenchymal atrophy. No ductal dilatation or inflammation. Spleen: Normal in size without focal abnormality. Adrenals/Urinary Tract: No adrenal nodule. No hydronephrosis or perinephric edema. Bilateral renal cysts are stable from prior exam. Ureters are decompressed. Urinary bladder is physiologically distended. Stomach/Bowel: Small hiatal hernia distended with enteric contrast. Gastric distention with enteric contrast. Dilated fluid-filled small bowel measures at 4.2 cm. Apparent transition in the right lower quadrant, image 60 series  2, appears similar to prior exam. There is mild mesenteric edema in the  right lower quadrant mesentery. No pneumatosis. Moderate stool burden throughout the colon. Advanced diverticulosis involving the descending and sigmoid colon without acute diverticulitis. Vascular/Lymphatic: Abdominal aortic atherosclerosis, maximal dimension 2.7 cm. No dissection. No adenopathy. Reproductive: Prostate appears enlarged, partially obscured by a artifact from left hip prosthesis. Other: Small volume mesenteric free fluid. No frank ascites. No intra-abdominal abscess. Tacks from prior anterior abdominal wall hernia repair in the anterior abdomen. No free air. Minimal fat in the inguinal canals. Musculoskeletal: Left hip arthroplasty. Degenerative change in the spine. There are no acute or suspicious osseous abnormalities. IMPRESSION: 1. Findings concerning for recurrent partial small bowel obstruction with transition point in the right lower quadrant. This is likely secondary to adhesions. 2. Distal colonic diverticulosis without acute inflammation. 3. Aortic atherosclerosis. Electronically Signed   By: Jeb Levering M.D.   On: 11/07/2016 04:00   Dg Abd Portable 1 View  Result Date: 11/07/2016 CLINICAL DATA:  83 y/o  M; enteric tube placement. EXAM: PORTABLE ABDOMEN - 1 VIEW COMPARISON:  11/07/2016 CT abdomen and pelvis. FINDINGS: Normal bowel gas pattern. Enteric tube tip projects over the mid abdomen. Moderate multilevel degenerative changes of the spine. Contrast excretion into the kidneys from prior CT of abdomen and pelvis. IMPRESSION: Enteric tube tip projects over the mid stomach. Proximal side hole is at the gastroesophageal junction, consider advancement. Electronically Signed   By: Kristine Garbe M.D.   On: 11/07/2016 05:07   ASSESSMENT AND PLAN:   Tyler Pena  is a 83 y.o. male with a known history of Recurrent Bowel obstruction, arthritis, bladder cancer, prostate hypertrophy, coronary artery disease with cardiac stent, hyperlipidemia, hypertension, GERD, gastric  ulcer, hiatal hernia presented to the emergency room with abdominal pain, nausea and vomiting. Patient has nausea and vomiting for the last 1-2 days and also has distention of the belly. He was here 10 days ago for bowel obstruction  1. Recurrent small bowel obstruction Suspected secondary to adhesions from previous multiple surgeries Conservative management for now -NG tube to suction - appreciate surgical consultation with Dr. Burt Knack  - IV fluids, ice chips  - serial abdominal x-rays   2. Abdominal distention and  Abdominal pain Due to 1.   3. Intractable nausea and vomiting - now improved after placing an NG tube   4.CAD Stable.  Not on any home meds due to pt being NPO  Case discussed with Care Management/Social Worker. Management plans discussed with the patient, family and they are in agreement.  CODE STATUS: DNR  DVT Prophylaxis: lovenox  TOTAL TIME TAKING CARE OF THIS PATIENT: *30* minutes.  >50% time spent on counselling and coordination of care  POSSIBLE D/C IN few DAYS, DEPENDING ON CLINICAL CONDITION.  Note: This dictation was prepared with Dragon dictation along with smaller phrase technology. Any transcriptional errors that result from this process are unintentional.  Aldrick Derrig M.D on 11/07/2016 at 1:48 PM  Between 7am to 6pm - Pager - [phone]  After 6pm go to [email or web address] - password [email or web address] [facility]  Office  [phone]  CC: Primary care physician; Dion Body, MD

## 2016-11-07 NOTE — ED Notes (Signed)
Patient unable to tolerate any more oral contrast and MD approved scan with what he has drank so far.

## 2016-11-07 NOTE — H&P (Signed)
[facility] [place] NAME: Tyler Pena    MR#:  [id number]  DATE OF BIRTH:  1921-01-20  DATE OF ADMISSION:  11/07/2016  PRIMARY CARE PHYSICIAN: Dion Body, MD   REQUESTING/REFERRING PHYSICIAN:   CHIEF COMPLAINT:   Chief Complaint  Patient presents with  . Abdominal Pain    HISTORY OF PRESENT ILLNESS: Tyler Pena  is a 83 y.o. male with a known history of Recurrent Bowel obstruction, arthritis, bladder cancer, prostate hypertrophy, coronary artery disease with cardiac stent, hyperlipidemia, hypertension, GERD, gastric ulcer, hiatal hernia presented to the emergency room with abdominal pain, nausea and vomiting. Patient has nausea and vomiting for the last 1-2 days and also has distention of the belly. He was here 10 days ago for bowel obstruction. Patient had bowel movement this morning which was scanty. He also has a abdominal hernia. Vomitus contained food and water. Patient has aching abdominal discomfort secondary to distention which is 4 out of 10 on a scale of 1-10. He was evaluated in the emergency room NG tube was put and connected to low intermittent suction. Hospitalist service was consulted for further care of the patient.  PAST MEDICAL HISTORY:   Past Medical History:  Diagnosis Date  . Arthritis   . Bilateral renal cysts   . Bladder cancer [facility])    urologist-  dr Karsten Ro  . BPH (benign prostatic hyperplasia)   . Coronary artery disease    hx PCI w/ stenting in 10/ 2014 in [place]--- recently moved from [place] jan 2018 has not established a cardiologist yet but does have pcp   . Dyslipidemia   . Essential hypertension   . GERD (gastroesophageal reflux disease)   . Hematuria   . History of gastric ulcer   . History of hiatal hernia   . History of kidney stones   . History of melanoma in situ    several Excision's in situ and malignant melanoma's--  last one 06/ 2017 top of ear excision Stage 1 w/  negative margins/  other have been right upper arm, shoulder, chest, face  . History of MI (myocardial infarction)    10/ 2014-- s/p  PCI and stenting x2  . History of small bowel obstruction    fall 2017  s/p  abdominal lysis adhesions  (prior hx bowel obstruction total 8 times)  . Nocturia   . S/P right coronary artery (RCA) stent placement    10/ 2014  x2  . Type 2 diabetes, diet controlled ([place])   . Wears glasses   . Wears hearing aid    bilateral    PAST SURGICAL HISTORY: Past Surgical History:  Procedure Laterality Date  . ABDOMINAL HERNIA REPAIR  2012  . ADDOMINAL LYSIS ADHESIONS  fall 2017   [facility]bo  . CHOLECYSTECTOMY OPEN  1971   and Appendectomy  . CORONARY ANGIOPLASTY WITH STENT PLACEMENT  04-17-2013  in [place]   x2 stents to RCA  . INGUINAL HERNIA REPAIR Bilateral 1995  . TOTAL HIP ARTHROPLASTY Left 2008  . TRANSURETHRAL RESECTION OF BLADDER TUMOR  09/2006   LEIOMYOMA   . TRANSURETHRAL RESECTION OF BLADDER TUMOR WITH MITOMYCIN-C N/A 09/02/2016   Procedure: TRANSURETHRAL RESECTION OF BLADDER TUMOR WITH MITOMYCIN-C;  Surgeon: Kathie Rhodes, MD;  Location: [facility];  Service: Urology;  Laterality: N/A;    SOCIAL HISTORY:  Social History  Substance Use Topics  . Smoking status: Never Smoker  . Smokeless tobacco: Never Used  .  Alcohol use Yes     Comment: occasional    FAMILY HISTORY:  Family History  Problem Relation Age of Onset  . Cancer Mother   . CAD Father   . CAD Sister   . CAD Sister   . CAD Sister     DRUG ALLERGIES: No Known Allergies  REVIEW OF SYSTEMS:   CONSTITUTIONAL: No fever, fatigue or weakness.  EYES: No blurred or double vision.  EARS, NOSE, AND THROAT: No tinnitus or ear pain.  RESPIRATORY: No cough, shortness of breath, wheezing or hemoptysis.  CARDIOVASCULAR: No chest pain, orthopnea, edema.  GASTROINTESTINAL: Has nausea, vomiting, abdominal pain.  No diarrhea noted GENITOURINARY: No dysuria, hematuria.   ENDOCRINE: No polyuria, nocturia,  HEMATOLOGY: No anemia, easy bruising or bleeding SKIN: No rash or lesion. MUSCULOSKELETAL: No joint pain or arthritis.   NEUROLOGIC: No tingling, numbness, weakness.  PSYCHIATRY: No anxiety or depression.   MEDICATIONS AT HOME:  Prior to Admission medications   Medication Sig Start Date End Date Taking? Authorizing Provider  aspirin EC 81 MG tablet Take 81 mg by mouth daily.   Yes [provider]  Black Pepper-Turmeric 3-500 MG CAPS Take 2 capsules by mouth daily.   Yes [provider]  cephALEXin (KEFLEX) 500 MG capsule Take 500 mg by mouth 3 (three) times daily.   Yes [provider]  Chromium-Cinnamon (CINNAMON PLUS CHROMIUM PO) Take 1 tablet by mouth 2 (two) times daily. 1000mg  cinnamon/  250mg  chromium   Yes [provider]  finasteride (PROSCAR) 5 MG tablet Take 5 mg by mouth every evening.    Yes [provider]  hydrochlorothiazide (HYDRODIURIL) 25 MG tablet Take 12.5 mg by mouth every morning.    Yes [provider]  losartan (COZAAR) 50 MG tablet Take 50 mg by mouth every evening.    Yes [provider]  metoprolol tartrate (LOPRESSOR) 25 MG tablet Take 50 mg by mouth every evening.    Yes [provider]  Multiple Vitamin (MULTIVITAMIN WITH MINERALS) TABS tablet Take 1 tablet by mouth daily.   Yes [provider]  Omega-3 Fatty Acids (SUPER OMEGA 3 EPA/DHA) 1000 MG CAPS Take 1 capsule by mouth daily. Omega 3 - DHA - EPA - Fish oil-- 1000mg / 120mg / 180mg    Yes [provider]  pantoprazole (PROTONIX) 40 MG tablet Take 40 mg by mouth every evening.    Yes [provider]  phenazopyridine (PYRIDIUM) 200 MG tablet Take 1 tablet (200 mg total) by mouth 3 (three) times daily as needed for pain. 09/02/16  Yes Kathie Rhodes, MD  tamsulosin (FLOMAX) 0.4 MG CAPS capsule Take 0.4 mg by mouth every evening.    Yes [provider]      PHYSICAL  EXAMINATION:   VITAL SIGNS: Blood pressure 122/69, pulse 74, temperature 97.6 F (36.4 C), temperature source Oral, resp. rate 19, height 5\' 11"  (1.803 m), weight 90.3 kg (199 lb), SpO2 94 %.  GENERAL:  83 y.o.-year-old patient lying in the bed with no acute distress.  EYES: Pupils equal, round, reactive to light and accommodation. No scleral icterus. Extraocular muscles intact.  HEENT: Head atraumatic, normocephalic. Oropharynx and nasopharynx clear.  NECK:  Supple, no jugular venous distention. No thyroid enlargement, no tenderness.  LUNGS: Normal breath sounds bilaterally, no wheezing, rales,rhonchi or crepitation. No use of accessory muscles of respiration.  CARDIOVASCULAR: S1, S2 normal. No murmurs, rubs, or gallops.  ABDOMEN: Soft, tenderness around umbilicus, distended. Bowel sounds absent. Has abdominal hernia.  EXTREMITIES:  No pedal edema, cyanosis, or clubbing.  NEUROLOGIC: Cranial nerves II through XII are intact. Muscle strength 5/5 in all extremities. Sensation intact. Gait not checked.  PSYCHIATRIC: The patient is alert and oriented x 3.  SKIN: No obvious rash, lesion, or ulcer.   LABORATORY PANEL:   CBC  Recent Labs Lab 11/07/16 0030  WBC 12.6*  HGB 14.1  HCT 41.1  PLT 231  MCV 87.2  MCH 29.9  MCHC 34.2  RDW 14.7*   ------------------------------------------------------------------------------------------------------------------  Chemistries   Recent Labs Lab 11/07/16 0030  NA 135  K 3.6  CL 96*  CO2 28  GLUCOSE 200*  BUN 22*  CREATININE 1.06  CALCIUM 9.5  AST 22  ALT 15*  ALKPHOS 62  BILITOT 0.8   ------------------------------------------------------------------------------------------------------------------ estimated creatinine clearance is 46.4 mL/min (by C-G formula based on SCr of 1.06 mg/dL). ------------------------------------------------------------------------------------------------------------------ No results for input(s): TSH,  T4TOTAL, T3FREE, THYROIDAB in the last 72 hours.  Invalid input(s): FREET3   Coagulation profile No results for input(s): INR, PROTIME in the last 168 hours. ------------------------------------------------------------------------------------------------------------------- No results for input(s): DDIMER in the last 72 hours. -------------------------------------------------------------------------------------------------------------------  Cardiac Enzymes  Recent Labs Lab 11/07/16 0030  TROPONINI <0.03   ------------------------------------------------------------------------------------------------------------------ Invalid input(s): POCBNP  ---------------------------------------------------------------------------------------------------------------  Urinalysis    Component Value Date/Time   COLORURINE YELLOW (A) 10/28/2016 0640   APPEARANCEUR CLEAR (A) 10/28/2016 0640   LABSPEC >1.046 (H) 10/28/2016 0640   PHURINE 5.0 10/28/2016 0640   GLUCOSEU 50 (A) 10/28/2016 0640   HGBUR SMALL (A) 10/28/2016 0640   BILIRUBINUR NEGATIVE 10/28/2016 0640   KETONESUR NEGATIVE 10/28/2016 0640   PROTEINUR NEGATIVE 10/28/2016 0640   NITRITE NEGATIVE 10/28/2016 0640   LEUKOCYTESUR SMALL (A) 10/28/2016 0640     RADIOLOGY: Ct Abdomen Pelvis W Contrast  Result Date: 11/07/2016 CLINICAL DATA:  83 year old with abdominal pain, nausea and vomiting. Concern for small bowel obstruction. EXAM: CT ABDOMEN AND PELVIS WITH CONTRAST TECHNIQUE: Multidetector CT imaging of the abdomen and pelvis was performed using the standard protocol following bolus administration of intravenous contrast. CONTRAST:  124mL ISOVUE-300 IOPAMIDOL (ISOVUE-300) INJECTION 61% COMPARISON:  CT angiography 10/28/2016 FINDINGS: Lower chest: Lung bases are clear. No pleural fluid. Small to moderate hiatal hernia. Hepatobiliary: Postcholecystectomy with intra and extrahepatic biliary prominence, common bile duct measures 14 mm. No  focal hepatic lesion. Pancreas: Parenchymal atrophy. No ductal dilatation or inflammation. Spleen: Normal in size without focal abnormality. Adrenals/Urinary Tract: No adrenal nodule. No hydronephrosis or perinephric edema. Bilateral renal cysts are stable from prior exam. Ureters are decompressed. Urinary bladder is physiologically distended. Stomach/Bowel: Small hiatal hernia distended with enteric contrast. Gastric distention with enteric contrast. Dilated fluid-filled small bowel measures at 4.2 cm. Apparent transition in the right lower quadrant, image 60 series 2, appears similar to prior exam. There is mild mesenteric edema in the right lower quadrant mesentery. No pneumatosis. Moderate stool burden throughout the colon. Advanced diverticulosis involving the descending and sigmoid colon without acute diverticulitis. Vascular/Lymphatic: Abdominal aortic atherosclerosis, maximal dimension 2.7 cm. No dissection. No adenopathy. Reproductive: Prostate appears enlarged, partially obscured by a artifact from left hip prosthesis. Other: Small volume mesenteric free fluid. No frank ascites. No intra-abdominal abscess. Tacks from prior anterior abdominal wall hernia repair in the anterior abdomen. No free air. Minimal fat in the inguinal canals. Musculoskeletal: Left hip arthroplasty. Degenerative change in the spine. There are no acute or suspicious osseous abnormalities. IMPRESSION: 1. Findings concerning for recurrent partial small bowel obstruction with transition point in the right lower quadrant. This is  likely secondary to adhesions. 2. Distal colonic diverticulosis without acute inflammation. 3. Aortic atherosclerosis. Electronically Signed   By: Jeb Levering M.D.   On: 11/07/2016 04:00   Dg Abd Portable 1 View  Result Date: 11/07/2016 CLINICAL DATA:  83 y/o  M; enteric tube placement. EXAM: PORTABLE ABDOMEN - 1 VIEW COMPARISON:  11/07/2016 CT abdomen and pelvis. FINDINGS: Normal bowel gas pattern.  Enteric tube tip projects over the mid abdomen. Moderate multilevel degenerative changes of the spine. Contrast excretion into the kidneys from prior CT of abdomen and pelvis. IMPRESSION: Enteric tube tip projects over the mid stomach. Proximal side hole is at the gastroesophageal junction, consider advancement. Electronically Signed   By: Kristine Garbe M.D.   On: 11/07/2016 05:07    EKG: Orders placed or performed during the hospital encounter of 11/07/16  . ED EKG  . ED EKG  . EKG 12-Lead  . EKG 12-Lead    IMPRESSION AND PLAN: 83 year old male patient with history of recurrent bowel obstruction, coronary artery disease, cardiac stent, bladder cancer, prostate hypertrophy presented to the emergency room with abdominal pain, distention, nausea and vomiting. Admitting diagnosis 1. Recurrent small bowel obstruction 2. Abdominal distention 3. Abdominal pain 4. Intractable nausea and vomiting 5.CAD Treatment plan Admit patient to medical floor NG tube with suction Keep patient nothing by mouth Surgical consult IV fluids Follow-up electrolytes Sequential compression devices to lower extremities for DVT prophylaxis All the records are reviewed and case discussed with ED provider. Management plans discussed with the patient, family and they are in agreement.  CODE STATUS:DNR Surrogate decision maker : daughter  Code Status History    Date Active Date Inactive Code Status Order ID Comments User Context   10/28/2016 12:22 PM 10/30/2016  1:17 PM DNR [id number]  Loletha Grayer, MD ED   10/07/2016 11:50 PM 10/08/2016  4:32 PM Full Code [id number]  Hugelmeyer, Ubaldo Glassing, DO Inpatient    Questions for Most Recent Historical Code Status (Order [id number])    Question Answer Comment   In the event of cardiac or respiratory ARREST Do not call a "code blue"    In the event of cardiac or respiratory ARREST Do not perform Intubation, CPR, defibrillation or ACLS    In the event of cardiac or  respiratory ARREST Use medication by any route, position, wound care, and other measures to relive pain and suffering. May use oxygen, suction and manual treatment of airway obstruction as needed for comfort.    Comments nurse may pronounce         Advance Directive Documentation     Most Recent Value  Type of Advance Directive  Healthcare Power of Attorney  Pre-existing out of facility DNR order (yellow form or pink MOST form)  -  "MOST" Form in Place?  -       TOTAL TIME TAKING CARE OF THIS PATIENT: 50 minutes.    Saundra Shelling M.D on 11/07/2016 at 5:10 AM  Between 7am to 6pm - Pager - [phone]  After 6pm go to [email or web address] - password [email or web address] [facility]  Office  [phone]  CC: Primary care physician; Dion Body, MD

## 2016-11-07 NOTE — Progress Notes (Addendum)
Inpatient Diabetes Program Recommendations  AACE/ADA: New Consensus Statement on Inpatient Glycemic Control (2015)  Target Ranges:  Prepandial:   less than 140 mg/dL      Peak postprandial:   less than 180 mg/dL (1-2 hours)      Critically ill patients:  140 - 180 mg/dL   Results for ADIB, WAHBA (MRN [id number]) as of 11/07/2016 09:07  Ref. Range 11/07/2016 00:30  Glucose Latest Ref Range: 65 - 99 mg/dL 200 (H)   Review of Glycemic Control  Diabetes history: DM2 (diet controlled) Outpatient Diabetes medications: None Current orders for Inpatient glycemic control: NA  Inpatient Diabetes Program Recommendations: Correction (SSI): While inpatient please consider ordering CBGs with Novolog 0-9 units Q4H.  Thanks, Barnie Alderman, RN, MSN, CDE Diabetes Coordinator Inpatient Diabetes Program [phone] (Team Pager from 8am to 5pm)

## 2016-11-07 NOTE — ED Provider Notes (Signed)
[facility] Emergency Department Provider Note   ____________________________________________   First MD Initiated Contact with Patient 11/07/16 0102     (approximate)  I have reviewed the triage vital signs and the nursing notes.   HISTORY  Chief Complaint Abdominal Pain    HPI Tyler Pena is a 83 y.o. male who comes into the hospital today with a small bowel obstruction. The patient's daughter reports that this is the 12th that he's had. The patient has an abdominal hernia. He's had mesh and has multiple adhesions all over his abdomen. The patient had lysis of adhesions 2 years ago in [place]. He was here 10 days ago and admitted with an obstruction. Typically if he gets an NG tube with some morphine and Zofran he does well. She reports though that she notices abdomen getting bigger and bigger over the last couple of days. She also reports that when he was last discharged she was sent home on a regular diet and is usually on a soft diet. The patient's pain started today. He rates it about 8 out of 10 in intensity. He didn't seem to complain though until after dinner. The patient has not had any vomiting and had a small bowel movement but it seemed to make his pain worse. The patient is here today for evaluation.   Past Medical History:  Diagnosis Date  . Arthritis   . Bilateral renal cysts   . Bladder cancer [facility])    urologist-  dr Karsten Ro  . BPH (benign prostatic hyperplasia)   . Coronary artery disease    hx PCI w/ stenting in 10/ 2014 in [place]--- recently moved from [place] jan 2018 has not established a cardiologist yet but does have pcp   . Dyslipidemia   . Essential hypertension   . GERD (gastroesophageal reflux disease)   . Hematuria   . History of gastric ulcer   . History of hiatal hernia   . History of kidney stones   . History of melanoma in situ    several Excision's in situ and malignant melanoma's--  last one 06/ 2017 top  of ear excision Stage 1 w/ negative margins/  other have been right upper arm, shoulder, chest, face  . History of MI (myocardial infarction)    10/ 2014-- s/p  PCI and stenting x2  . History of small bowel obstruction    fall 2017  s/p  abdominal lysis adhesions  (prior hx bowel obstruction total 8 times)  . Nocturia   . S/P right coronary artery (RCA) stent placement    10/ 2014  x2  . Type 2 diabetes, diet controlled ([place])   . Wears glasses   . Wears hearing aid    bilateral    Patient Active Problem List   Diagnosis Date Noted  . Partial small bowel obstruction ([place]) 10/28/2016  . Chest pain, rule out acute myocardial infarction 10/07/2016  . Bladder tumor 09/02/2016  . Benign prostatic hyperplasia with weak urinary stream 08/18/2016  . Coronary artery disease involving native coronary artery of native heart without angina pectoris 08/18/2016  . Essential hypertension 08/18/2016  . GERD without esophagitis 08/18/2016    Past Surgical History:  Procedure Laterality Date  . ABDOMINAL HERNIA REPAIR  2012  . ADDOMINAL LYSIS ADHESIONS  fall 2017   [facility]  . CHOLECYSTECTOMY OPEN  1971   and Appendectomy  . CORONARY ANGIOPLASTY WITH STENT PLACEMENT  04-17-2013  in [place]   x2 stents to  RCA  . INGUINAL HERNIA REPAIR Bilateral 1995  . TOTAL HIP ARTHROPLASTY Left 2008  . TRANSURETHRAL RESECTION OF BLADDER TUMOR  09/2006   LEIOMYOMA   . TRANSURETHRAL RESECTION OF BLADDER TUMOR WITH MITOMYCIN-C N/A 09/02/2016   Procedure: TRANSURETHRAL RESECTION OF BLADDER TUMOR WITH MITOMYCIN-C;  Surgeon: Kathie Rhodes, MD;  Location: [facility];  Service: Urology;  Laterality: N/A;    Prior to Admission medications   Medication Sig Start Date End Date Taking? Authorizing Provider  aspirin EC 81 MG tablet Take 81 mg by mouth daily.    [provider]  Black Pepper-Turmeric 3-500 MG CAPS Take 2 capsules by mouth daily.    [provider]  Chromium-Cinnamon  (CINNAMON PLUS CHROMIUM PO) Take 1 tablet by mouth 2 (two) times daily. 1000mg  cinnamon/  250mg  chromium    [provider]  finasteride (PROSCAR) 5 MG tablet Take 5 mg by mouth every evening.     [provider]  hydrochlorothiazide (HYDRODIURIL) 25 MG tablet Take 12.5 mg by mouth every morning.     [provider]  losartan (COZAAR) 50 MG tablet Take 50 mg by mouth every evening.     [provider]  metoprolol tartrate (LOPRESSOR) 25 MG tablet Take 50 mg by mouth every evening.     [provider]  Multiple Vitamin (MULTIVITAMIN WITH MINERALS) TABS tablet Take 1 tablet by mouth daily.    [provider]  Omega-3 Fatty Acids (SUPER OMEGA 3 EPA/DHA) 1000 MG CAPS Take 1 capsule by mouth daily. Omega 3 - DHA - EPA - Fish oil-- 1000mg / 120mg / 180mg     [provider]  pantoprazole (PROTONIX) 40 MG tablet Take 40 mg by mouth every evening.     [provider]  phenazopyridine (PYRIDIUM) 200 MG tablet Take 1 tablet (200 mg total) by mouth 3 (three) times daily as needed for pain. 09/02/16   Kathie Rhodes, MD  tamsulosin (FLOMAX) 0.4 MG CAPS capsule Take 0.4 mg by mouth every evening.     [provider]    Allergies Patient has no known allergies.  Family History  Problem Relation Age of Onset  . Cancer Mother   . CAD Father   . CAD Sister   . CAD Sister   . CAD Sister     Social History Social History  Substance Use Topics  . Smoking status: Never Smoker  . Smokeless tobacco: Never Used  . Alcohol use Yes     Comment: occasional    Review of Systems  Constitutional: No fever/chills Eyes: No visual changes. ENT: No sore throat. Cardiovascular: Denies chest pain. Respiratory: Denies shortness of breath. Gastrointestinal:  abdominal pain.  No nausea, no vomiting.  No diarrhea.  No constipation. Genitourinary: Negative for dysuria. Musculoskeletal: Negative for back pain. Skin: Negative for  rash. Neurological: Negative for headaches, focal weakness or numbness.   ____________________________________________   PHYSICAL EXAM:  VITAL SIGNS: ED Triage Vitals [11/07/16 0015]  Enc Vitals Group     BP (!) 96/45     Pulse Rate 75     Resp 18     Temp 97.6 F (36.4 C)     Temp Source Oral     SpO2 99 %     Weight 199 lb (90.3 kg)     Height 5\' 11"  (1.803 m)     Head Circumference      Peak Flow      Pain Score 10     Pain Loc  Pain Edu?      Excl. in [place]?     Constitutional: Alert and oriented. Well appearing and in no acute distress. Eyes: Conjunctivae are normal. PERRL. EOMI. Head: Atraumatic. Nose: No congestion/rhinnorhea. Mouth/Throat: Mucous membranes are moist.  Oropharynx non-erythematous. Cardiovascular: Normal rate, regular rhythm. Systolic murmur Good peripheral circulation. Respiratory: Normal respiratory effort.  No retractions. Lungs CTAB. Gastrointestinal: Soft with some diffuse tenderness to palpation. Moderate distention. Diminished bowel sounds Musculoskeletal: No lower extremity tenderness nor edema.   Neurologic:  Normal speech and language.  Skin:  Skin is warm, dry and intact.  Psychiatric: Mood and affect are normal.   ____________________________________________   LABS (all labs ordered are listed, but only abnormal results are displayed)  Labs Reviewed  CBC - Abnormal; Notable for the following:       Result Value   WBC 12.6 (*)    RDW 14.7 (*)    All other components within normal limits  COMPREHENSIVE METABOLIC PANEL - Abnormal; Notable for the following:    Chloride 96 (*)    Glucose, Bld 200 (*)    BUN 22 (*)    ALT 15 (*)    GFR calc non Af Amer 58 (*)    All other components within normal limits  LACTIC ACID, PLASMA  TROPONIN I  LIPASE, BLOOD   ____________________________________________  EKG  ED ECG REPORT I, Loney Hering, the attending physician, personally viewed and interpreted this ECG.   Date:  11/07/2016  EKG Time: 124  Rate: 69  Rhythm: normal sinus rhythm  Axis: normal  Intervals:none  ST&T Change: none  ____________________________________________  RADIOLOGY  CT abd and pelvis ____________________________________________   PROCEDURES  Procedure(s) performed: None  Procedures  Critical Care performed: No  ____________________________________________   INITIAL IMPRESSION / ASSESSMENT AND PLAN / ED COURSE  Pertinent labs & imaging results that were available during my care of the patient were reviewed by me and considered in my medical decision making (see chart for details).  This is a 83 year old male who comes into the hospital today with some abdominal pain. The patient does have a history of bowel obstructions. He will be reassessed once I received all of his blood work and imaging studies. I will give him some morphine, Zofran and some normal saline.  Clinical Course as of Nov 08 442  Mon Nov 07, 2016  0423 . Findings concerning for recurrent partial small bowel obstruction with transition point in the right lower quadrant. This is likely secondary to adhesions. 2. Distal colonic diverticulosis without acute inflammation. 3. Aortic atherosclerosis.   CT Abdomen Pelvis W Contrast [AW]    Clinical Course User Index [AW] Loney Hering, MD    The patient did start vomiting while in the emergency department. He received a CT scan which is concerning for a partial bowel obstruction. I will place an NG tube and admit the patient to the hospitalist service as previous. The patient has no concerns at this time. ____________________________________________   FINAL CLINICAL IMPRESSION(S) / ED DIAGNOSES  Final diagnoses:  Partial small bowel obstruction (HCC)      NEW MEDICATIONS STARTED DURING THIS VISIT:  New Prescriptions   No medications on file     Note:  This document was prepared using Dragon voice recognition software and may  include unintentional dictation errors.    Loney Hering, MD 11/07/16 [phone]

## 2016-11-08 ENCOUNTER — Inpatient Hospital Stay: Payer: Medicare Other

## 2016-11-08 DIAGNOSIS — K56609 Unspecified intestinal obstruction, unspecified as to partial versus complete obstruction: Secondary | ICD-10-CM

## 2016-11-08 LAB — GLUCOSE, CAPILLARY
GLUCOSE-CAPILLARY: 123 mg/dL — AB (ref 65–99)
GLUCOSE-CAPILLARY: 95 mg/dL (ref 65–99)
Glucose-Capillary: 175 mg/dL — ABNORMAL HIGH (ref 65–99)
Glucose-Capillary: 130 mg/dL — ABNORMAL HIGH (ref 65–99)

## 2016-11-08 MED ORDER — FINASTERIDE 5 MG PO TABS
5.0000 mg | ORAL_TABLET | Freq: Every evening | ORAL | Status: DC
  Administered 2016-11-08: 5 mg via ORAL
  Filled 2016-11-08: qty 1

## 2016-11-08 MED ORDER — ADULT MULTIVITAMIN W/MINERALS CH
1.0000 | ORAL_TABLET | Freq: Every day | ORAL | Status: DC
  Administered 2016-11-09: 1 via ORAL
  Filled 2016-11-08 (×2): qty 1

## 2016-11-08 MED ORDER — OMEGA-3-ACID ETHYL ESTERS 1 G PO CAPS
1.0000 | ORAL_CAPSULE | Freq: Every day | ORAL | Status: DC
  Administered 2016-11-09: 1 g via ORAL
  Filled 2016-11-08 (×2): qty 1

## 2016-11-08 MED ORDER — LOSARTAN POTASSIUM 50 MG PO TABS
50.0000 mg | ORAL_TABLET | Freq: Every evening | ORAL | Status: DC
  Administered 2016-11-08: 50 mg via ORAL
  Filled 2016-11-08: qty 1

## 2016-11-08 MED ORDER — HYDROCHLOROTHIAZIDE 25 MG PO TABS
12.5000 mg | ORAL_TABLET | Freq: Every morning | ORAL | Status: DC
  Administered 2016-11-09: 12.5 mg via ORAL
  Filled 2016-11-08: qty 1

## 2016-11-08 MED ORDER — TAMSULOSIN HCL 0.4 MG PO CAPS
0.4000 mg | ORAL_CAPSULE | Freq: Every evening | ORAL | Status: DC
  Administered 2016-11-08: 0.4 mg via ORAL
  Filled 2016-11-08: qty 1

## 2016-11-08 MED ORDER — PANTOPRAZOLE SODIUM 40 MG PO TBEC
40.0000 mg | DELAYED_RELEASE_TABLET | Freq: Every evening | ORAL | Status: DC
  Administered 2016-11-08: 40 mg via ORAL
  Filled 2016-11-08: qty 1

## 2016-11-08 MED ORDER — ASPIRIN EC 81 MG PO TBEC
81.0000 mg | DELAYED_RELEASE_TABLET | Freq: Every day | ORAL | Status: DC
  Administered 2016-11-09: 81 mg via ORAL
  Filled 2016-11-08: qty 1

## 2016-11-08 MED ORDER — METOPROLOL TARTRATE 50 MG PO TABS
50.0000 mg | ORAL_TABLET | Freq: Every evening | ORAL | Status: DC
  Administered 2016-11-08: 50 mg via ORAL
  Filled 2016-11-08: qty 1

## 2016-11-08 NOTE — Progress Notes (Addendum)
[place] at [place] NAME: Tyler Pena    MR#:  [id number]  DATE OF BIRTH:  1920-07-14  SUBJECTIVE:  Came in with nausea and vomiting. Ng removed Had 2 BM's Tolerated CLD  REVIEW OF SYSTEMS:   Review of Systems  Constitutional: Negative for chills, fever and weight loss.  HENT: Negative for ear discharge, ear pain and nosebleeds.   Eyes: Negative for blurred vision, pain and discharge.  Respiratory: Negative for sputum production, shortness of breath, wheezing and stridor.   Cardiovascular: Negative for chest pain, palpitations, orthopnea and PND.  Gastrointestinal: Negative for abdominal pain, diarrhea, nausea and vomiting.  Genitourinary: Negative for frequency and urgency.  Musculoskeletal: Negative for back pain and joint pain.  Neurological: Positive for weakness. Negative for sensory change, speech change and focal weakness.  Psychiatric/Behavioral: Negative for depression and hallucinations. The patient is not nervous/anxious.    Tolerating Diet:FLD Tolerating PT: ambulatory  DRUG ALLERGIES:  No Known Allergies  VITALS:  Blood pressure 114/66, pulse (!) 54, temperature 97.4 F (36.3 C), temperature source Oral, resp. rate 16, height 5\' 11"  (1.803 m), weight 90.3 kg (199 lb), SpO2 100 %.  PHYSICAL EXAMINATION:   Physical Exam  GENERAL:  83 y.o.-year-old patient lying in the bed with no acute distress.  EYES: Pupils equal, round, reactive to light and accommodation. No scleral icterus. Extraocular muscles intact.  HEENT: Head atraumatic, normocephalic. Oropharynx and nasopharynx clear. NG+ NECK:  Supple, no jugular venous distention. No thyroid enlargement, no tenderness.  LUNGS: Normal breath sounds bilaterally, no wheezing, rales, rhonchi. No use of accessory muscles of respiration.  CARDIOVASCULAR: S1, S2 normal. No murmurs, rubs, or gallops.  ABDOMEN: Soft, nontender, nondistended. Few Bowel sounds present. No  organomegaly or mass.  EXTREMITIES: No cyanosis, clubbing or edema b/l.    NEUROLOGIC: Cranial nerves II through XII are intact. No focal Motor or sensory deficits b/l.   PSYCHIATRIC:  patient is alert and oriented x 3.  SKIN: No obvious rash, lesion, or ulcer.   LABORATORY PANEL:  CBC  Recent Labs Lab 11/07/16 0030  WBC 12.6*  HGB 14.1  HCT 41.1  PLT 231    Chemistries   Recent Labs Lab 11/07/16 0030  NA 135  K 3.6  CL 96*  CO2 28  GLUCOSE 200*  BUN 22*  CREATININE 1.06  CALCIUM 9.5  AST 22  ALT 15*  ALKPHOS 62  BILITOT 0.8   Cardiac Enzymes  Recent Labs Lab 11/07/16 0030  TROPONINI <0.03   RADIOLOGY:  Ct Abdomen Pelvis W Contrast  Result Date: 11/07/2016 CLINICAL DATA:  83 year old with abdominal pain, nausea and vomiting. Concern for small bowel obstruction. EXAM: CT ABDOMEN AND PELVIS WITH CONTRAST TECHNIQUE: Multidetector CT imaging of the abdomen and pelvis was performed using the standard protocol following bolus administration of intravenous contrast. CONTRAST:  147mL ISOVUE-300 IOPAMIDOL (ISOVUE-300) INJECTION 61% COMPARISON:  CT angiography 10/28/2016 FINDINGS: Lower chest: Lung bases are clear. No pleural fluid. Small to moderate hiatal hernia. Hepatobiliary: Postcholecystectomy with intra and extrahepatic biliary prominence, common bile duct measures 14 mm. No focal hepatic lesion. Pancreas: Parenchymal atrophy. No ductal dilatation or inflammation. Spleen: Normal in size without focal abnormality. Adrenals/Urinary Tract: No adrenal nodule. No hydronephrosis or perinephric edema. Bilateral renal cysts are stable from prior exam. Ureters are decompressed. Urinary bladder is physiologically distended. Stomach/Bowel: Small hiatal hernia distended with enteric contrast. Gastric distention with enteric contrast. Dilated fluid-filled small bowel measures at 4.2 cm. Apparent transition in  the right lower quadrant, image 60 series 2, appears similar to prior exam.  There is mild mesenteric edema in the right lower quadrant mesentery. No pneumatosis. Moderate stool burden throughout the colon. Advanced diverticulosis involving the descending and sigmoid colon without acute diverticulitis. Vascular/Lymphatic: Abdominal aortic atherosclerosis, maximal dimension 2.7 cm. No dissection. No adenopathy. Reproductive: Prostate appears enlarged, partially obscured by a artifact from left hip prosthesis. Other: Small volume mesenteric free fluid. No frank ascites. No intra-abdominal abscess. Tacks from prior anterior abdominal wall hernia repair in the anterior abdomen. No free air. Minimal fat in the inguinal canals. Musculoskeletal: Left hip arthroplasty. Degenerative change in the spine. There are no acute or suspicious osseous abnormalities. IMPRESSION: 1. Findings concerning for recurrent partial small bowel obstruction with transition point in the right lower quadrant. This is likely secondary to adhesions. 2. Distal colonic diverticulosis without acute inflammation. 3. Aortic atherosclerosis. Electronically Signed   By: Jeb Levering M.D.   On: 11/07/2016 04:00   Dg Abd 2 Views  Result Date: 11/08/2016 CLINICAL DATA:  Small bowel obstruction. Assess for residual contrast. EXAM: ABDOMEN - 2 VIEW COMPARISON:  Portable supine abdominal radiograph of Nov 07, 2016 and abdominal series of later the same day following nasogastric tube placement. FINDINGS: The orogastric tube tip projects in the region of the pylorus or duodenal bulb. There is a small amount of gas within normal caliber to mildly distended small bowel loops similar to that seen on yesterday's study. There is contrast within the splenic flexure and descending colon with a small amount of gas and contrast and stool in the rectosigmoid. No free extraluminal gas collections are observed. IMPRESSION: Fairly stable appearance of the abdomen. Contrast and stool in the left colon. There is persistent minimal gaseous  distention of small-bowel loops. Electronically Signed   By: David  Martinique M.D.   On: 11/08/2016 08:13   Dg Abd 2 Views  Result Date: 11/07/2016 CLINICAL DATA:  Recurrent bowel obstruction. Abdominal pain, nausea, vomiting. Distension of the abdomen. Last bowel movement this morning. History of abdominal hernia. EXAM: ABDOMEN - 2 VIEW COMPARISON:  11/07/2016 FINDINGS: Nasogastric tube tip overlies the level of the distal stomach. There is large amount of stool within nondilated loops of colon. There is paucity of small bowel gas, limiting evaluation of the small bowel loops. Contrast is identified within the urinary bladder following CT of the abdomen and pelvis today. Patient has had previous left hip arthroplasty. IMPRESSION: 1. No evidence for free intraperitoneal air. 2. Moderate stool burden. 3. Bowel gas pattern is nonobstructive. However, paucity of small bowel gas limits evaluation of the small bowel loops. Electronically Signed   By: Nolon Nations M.D.   On: 11/07/2016 14:27   Dg Abd Portable 1 View  Result Date: 11/07/2016 CLINICAL DATA:  83 y/o  M; enteric tube placement. EXAM: PORTABLE ABDOMEN - 1 VIEW COMPARISON:  11/07/2016 CT abdomen and pelvis. FINDINGS: Normal bowel gas pattern. Enteric tube tip projects over the mid abdomen. Moderate multilevel degenerative changes of the spine. Contrast excretion into the kidneys from prior CT of abdomen and pelvis. IMPRESSION: Enteric tube tip projects over the mid stomach. Proximal side hole is at the gastroesophageal junction, consider advancement. Electronically Signed   By: Kristine Garbe M.D.   On: 11/07/2016 05:07   ASSESSMENT AND PLAN:   Tyler Pena  is a 83 y.o. male with a known history of Recurrent Bowel obstruction, arthritis, bladder cancer, prostate hypertrophy, coronary artery disease with cardiac stent, hyperlipidemia, hypertension, GERD,  gastric ulcer, hiatal hernia presented to the emergency room with abdominal pain,  nausea and vomiting. Patient has nausea and vomiting for the last 1-2 days and also has distention of the belly. He was here 10 days ago for bowel obstruction  1. Recurrent small bowel obstruction Suspected secondary to adhesions from previous multiple surgeries Conservative management for now -NG tube removed. Patient had 2 bowel movements. - appreciate surgical consultation with Dr. Burt Knack  -DC IV fluids. Advance diet to full liquids for lunch and soft for supper - serial abdominal x-rays shows improvement  2. Abdominal distention and  Abdominal pain Due to 1.   3. Intractable nausea and vomiting - now improved  4.CAD Stable.  Resume home meds  From surgical standpoint patient is okay for discharge however patient wants to stay 1 more day to make sure he tolerates solids. He wants to go home tomorrow.  Case discussed with Care Management/Social Worker. Management plans discussed with the patient, family and they are in agreement.  CODE STATUS: DNR  DVT Prophylaxis: lovenox  TOTAL TIME TAKING CARE OF THIS PATIENT: *25* minutes.  >50% time spent on counselling and coordination of care  POSSIBLE D/C IN few DAYS, DEPENDING ON CLINICAL CONDITION.  Note: This dictation was prepared with Dragon dictation along with smaller phrase technology. Any transcriptional errors that result from this process are unintentional.  Kathryn Cosby M.D on 11/08/2016 at 3:03 PM  Between 7am to 6pm - Pager - [phone]  After 6pm go to [email or web address] - password [email or web address] [facility]  Office  [phone]  CC: Primary care physician; Dion Body, MD

## 2016-11-08 NOTE — Progress Notes (Signed)
Dr patel gave orders to change patient to a full liquid diet now and then increase his diet to a soft for supper this evening if the patient is still doing well

## 2016-11-08 NOTE — Progress Notes (Signed)
CC: Partial small bowel obstruction Subjective: Patient feels much better today he has no abdominal pain he had a bowel movement is passing gas no nausea or vomiting he feels that his small bowel obstruction is resolving. He has no fevers or chills  Objective: Vital signs in last 24 hours: Temp:  [97.3 F (36.3 C)-97.9 F (36.6 C)] 97.3 F (36.3 C) (05/15 0535) Pulse Rate:  [56-73] 57 (05/15 0535) Resp:  [16-19] 19 (05/15 0535) BP: (87-130)/(41-72) 130/58 (05/15 0535) SpO2:  [95 %-100 %] 96 % (05/15 0535) Last BM Date: 11/07/16  Intake/Output from previous day: 05/14 0701 - 05/15 0700 In: 2190 [I.V.:2170; NG/GT:20] Out: 240 [Emesis/NG output:240] Intake/Output this shift: No intake/output data recorded.  Physical exam:   Awake alert and oriented vital signs are stable and afebrile abdomen is soft nondistended minimally tympanitic and nontender calves are nontender no icterus no jaundice  Lab Results: CBC   Recent Labs  11/07/16 0030  WBC 12.6*  HGB 14.1  HCT 41.1  PLT 231   BMET  Recent Labs  11/07/16 0030  NA 135  K 3.6  CL 96*  CO2 28  GLUCOSE 200*  BUN 22*  CREATININE 1.06  CALCIUM 9.5   PT/INR No results for input(s): LABPROT, INR in the last 72 hours. ABG No results for input(s): PHART, HCO3 in the last 72 hours.  Invalid input(s): PCO2, PO2  Studies/Results: Ct Abdomen Pelvis W Contrast  Result Date: 11/07/2016 CLINICAL DATA:  83 year old with abdominal pain, nausea and vomiting. Concern for small bowel obstruction. EXAM: CT ABDOMEN AND PELVIS WITH CONTRAST TECHNIQUE: Multidetector CT imaging of the abdomen and pelvis was performed using the standard protocol following bolus administration of intravenous contrast. CONTRAST:  158mL ISOVUE-300 IOPAMIDOL (ISOVUE-300) INJECTION 61% COMPARISON:  CT angiography 10/28/2016 FINDINGS: Lower chest: Lung bases are clear. No pleural fluid. Small to moderate hiatal hernia. Hepatobiliary: Postcholecystectomy with  intra and extrahepatic biliary prominence, common bile duct measures 14 mm. No focal hepatic lesion. Pancreas: Parenchymal atrophy. No ductal dilatation or inflammation. Spleen: Normal in size without focal abnormality. Adrenals/Urinary Tract: No adrenal nodule. No hydronephrosis or perinephric edema. Bilateral renal cysts are stable from prior exam. Ureters are decompressed. Urinary bladder is physiologically distended. Stomach/Bowel: Small hiatal hernia distended with enteric contrast. Gastric distention with enteric contrast. Dilated fluid-filled small bowel measures at 4.2 cm. Apparent transition in the right lower quadrant, image 60 series 2, appears similar to prior exam. There is mild mesenteric edema in the right lower quadrant mesentery. No pneumatosis. Moderate stool burden throughout the colon. Advanced diverticulosis involving the descending and sigmoid colon without acute diverticulitis. Vascular/Lymphatic: Abdominal aortic atherosclerosis, maximal dimension 2.7 cm. No dissection. No adenopathy. Reproductive: Prostate appears enlarged, partially obscured by a artifact from left hip prosthesis. Other: Small volume mesenteric free fluid. No frank ascites. No intra-abdominal abscess. Tacks from prior anterior abdominal wall hernia repair in the anterior abdomen. No free air. Minimal fat in the inguinal canals. Musculoskeletal: Left hip arthroplasty. Degenerative change in the spine. There are no acute or suspicious osseous abnormalities. IMPRESSION: 1. Findings concerning for recurrent partial small bowel obstruction with transition point in the right lower quadrant. This is likely secondary to adhesions. 2. Distal colonic diverticulosis without acute inflammation. 3. Aortic atherosclerosis. Electronically Signed   By: Jeb Levering M.D.   On: 11/07/2016 04:00   Dg Abd 2 Views  Result Date: 11/08/2016 CLINICAL DATA:  Small bowel obstruction. Assess for residual contrast. EXAM: ABDOMEN - 2 VIEW  COMPARISON:  Portable  supine abdominal radiograph of Nov 07, 2016 and abdominal series of later the same day following nasogastric tube placement. FINDINGS: The orogastric tube tip projects in the region of the pylorus or duodenal bulb. There is a small amount of gas within normal caliber to mildly distended small bowel loops similar to that seen on yesterday's study. There is contrast within the splenic flexure and descending colon with a small amount of gas and contrast and stool in the rectosigmoid. No free extraluminal gas collections are observed. IMPRESSION: Fairly stable appearance of the abdomen. Contrast and stool in the left colon. There is persistent minimal gaseous distention of small-bowel loops. Electronically Signed   By: David  Martinique M.D.   On: 11/08/2016 08:13   Dg Abd 2 Views  Result Date: 11/07/2016 CLINICAL DATA:  Recurrent bowel obstruction. Abdominal pain, nausea, vomiting. Distension of the abdomen. Last bowel movement this morning. History of abdominal hernia. EXAM: ABDOMEN - 2 VIEW COMPARISON:  11/07/2016 FINDINGS: Nasogastric tube tip overlies the level of the distal stomach. There is large amount of stool within nondilated loops of colon. There is paucity of small bowel gas, limiting evaluation of the small bowel loops. Contrast is identified within the urinary bladder following CT of the abdomen and pelvis today. Patient has had previous left hip arthroplasty. IMPRESSION: 1. No evidence for free intraperitoneal air. 2. Moderate stool burden. 3. Bowel gas pattern is nonobstructive. However, paucity of small bowel gas limits evaluation of the small bowel loops. Electronically Signed   By: Nolon Nations M.D.   On: 11/07/2016 14:27   Dg Abd Portable 1 View  Result Date: 11/07/2016 CLINICAL DATA:  83 y/o  M; enteric tube placement. EXAM: PORTABLE ABDOMEN - 1 VIEW COMPARISON:  11/07/2016 CT abdomen and pelvis. FINDINGS: Normal bowel gas pattern. Enteric tube tip projects over the  mid abdomen. Moderate multilevel degenerative changes of the spine. Contrast excretion into the kidneys from prior CT of abdomen and pelvis. IMPRESSION: Enteric tube tip projects over the mid stomach. Proximal side hole is at the gastroesophageal junction, consider advancement. Electronically Signed   By: Kristine Garbe M.D.   On: 11/07/2016 05:07    Anti-infectives: Anti-infectives    None      Assessment/Plan:  KUB is personally reviewed and with the patient's clinical improvement I will discontinue his nasogastric tube today and start clear liquids. This will plan was discussed with family last night. Overall his bowel obstruction seems to be improving and if not completely resolved his working towards resolution.  Florene Glen, MD, FACS  11/08/2016

## 2016-11-09 LAB — URINALYSIS, COMPLETE (UACMP) WITH MICROSCOPIC
Bacteria, UA: NONE SEEN
Bilirubin Urine: NEGATIVE
Glucose, UA: NEGATIVE mg/dL
HGB URINE DIPSTICK: NEGATIVE
Ketones, ur: NEGATIVE mg/dL
Leukocytes, UA: NEGATIVE
Nitrite: NEGATIVE
PROTEIN: NEGATIVE mg/dL
SPECIFIC GRAVITY, URINE: 1.004 — AB (ref 1.005–1.030)
Squamous Epithelial / LPF: NONE SEEN
pH: 7 (ref 5.0–8.0)

## 2016-11-09 LAB — GLUCOSE, CAPILLARY
GLUCOSE-CAPILLARY: 196 mg/dL — AB (ref 65–99)
Glucose-Capillary: 128 mg/dL — ABNORMAL HIGH (ref 65–99)

## 2016-11-09 NOTE — Progress Notes (Signed)
Patient discharged to home as ordered. Patient states that he will make follow up appointment when he gets home. Patient is alert and oriented, ambulates well. IV discontinued site clean dry and intact. Patient denies pain at this time. Patients daughter will take patient home.

## 2016-11-09 NOTE — Progress Notes (Signed)
Called Dr. Estanislado Pandy regarding urinalysis due to burning with urination per patient request.  Doctor requested to wait until morning for urinalysis.  Phoebe Sharps N  11/09/2016  3:01 AM

## 2016-11-09 NOTE — Discharge Instructions (Signed)
Ileus  Ileus is a condition in which the intestines, also called the bowels, stop working and moving correctly. If the intestines stop working, food cannot pass through to get digested. The intestines are hollow organs that digest food after the food leaves the stomach. These organs are long, muscular tubes that connect the stomach to the rectum. When ileus occurs, the muscular contractions that cause food to move through the intestines stop happening as they normally would.  Ileus can occur for various reasons. This condition is a serious problem that usually requires hospitalization. It can cause symptoms such as nausea, abdominal pain, and bloating. Ileus can last from a few hours to a few days. If the intestines stop working because of a blockage, that is a different condition that is called a bowel obstruction.  What are the causes?  This condition may be caused by:  · Surgery on the abdomen.  · An infection or inflammation in the abdomen. This includes inflammation of the lining of the abdomen (peritonitis).  · Infection or inflammation in other parts of the body, such as pneumonia or pancreatitis.  · Passage of gallstones or kidney stones.  · Damage to the nerves or blood vessels that go to the intestines.  · A collection of blood within the abdominal cavity.  · Imbalance in the salts in the blood (electrolytes).  · Injury to the brain or spinal cord.  · Medicines. Many medicines, including strong pain medicines, can cause ileus or make it worse.    What are the signs or symptoms?  Symptoms of this condition include:  · Bloating of the abdomen.  · Pain or discomfort in the abdomen.  · Poor appetite.  · Nausea and vomiting.  · Lack of normal bowel sounds, such as “growling" in the stomach.    How is this diagnosed?  This condition may be diagnosed with:  · A physical exam and medical history.  · X-rays or a CT scan of the abdomen.    You may also have other tests to help find the cause of the condition.  How  is this treated?  Treatment for this condition may include:  · Resting the intestines until they start to work again. This is often done by:  ? Stopping oral intake of food and drink. You will be given fluid through an IV tube to prevent dehydration.  ? Placing a small tube (nasogastric tube or NG tube) that is passed through your nose and into your stomach. The tube is attached to a suction device and keeps the stomach emptied out. This allows the bowels to rest and also helps to reduce nausea and vomiting.  · Correcting any electrolyte imbalance by giving supplements in the IV fluid.  · Stopping any medicines that might make ileus worse.  · Treating any condition that may have caused ileus.    Follow these instructions at home:  · Follow instructions from your health care provider about diet and fluid intake. Usually, you will be told to:  ? Drink plenty of clear fluids.  ? Avoid alcohol.  ? Avoid caffeine.  ? Eat a bland diet.  · Get plenty of rest. Return to your normal activities as told by your health care provider.  · Take over-the-counter and prescription medicines only as told by your health care provider.  · Keep all follow-up visits as told by your health care provider. This is important.  Contact a health care provider if:  · You have nausea, vomiting,   or abdominal discomfort.  · You have a fever.  Get help right away if:  · You have severe abdominal pain or bloating.  · You cannot eat or drink without vomiting.  This information is not intended to replace advice given to you by your health care provider. Make sure you discuss any questions you have with your health care provider.  Document Released: 06/16/2003 Document Revised: 11/19/2015 Document Reviewed: 08/07/2014  Elsevier Interactive Patient Education © 2017 [employer]

## 2016-11-09 NOTE — Discharge Summary (Signed)
[facility] [place] NAME: Tyler Pena    MR#:  [id number]  DATE OF BIRTH:  [birth year]  DATE OF ADMISSION:  11/07/2016 ADMITTING PHYSICIAN: Saundra Shelling, MD  DATE OF DISCHARGE: 11/09/16  PRIMARY CARE PHYSICIAN: Dion Body, MD    ADMISSION DIAGNOSIS:  Partial small bowel obstruction ([place]) [K56.600]  DISCHARGE DIAGNOSIS:  Partial sbo,recurrent-resolved  SECONDARY DIAGNOSIS:   Past Medical History:  Diagnosis Date  . Arthritis   . Bilateral renal cysts   . Bladder cancer [facility])    urologist-  dr Karsten Ro  . BPH (benign prostatic hyperplasia)   . Coronary artery disease    hx PCI w/ stenting in 10/ 2014 in [place]--- recently moved from [place] jan 2018 has not established a cardiologist yet but does have pcp   . Dyslipidemia   . Essential hypertension   . GERD (gastroesophageal reflux disease)   . Hematuria   . History of gastric ulcer   . History of hiatal hernia   . History of kidney stones   . History of melanoma in situ    several Excision's in situ and malignant melanoma's--  last one 06/ 2017 top of ear excision Stage 1 w/ negative margins/  other have been right upper arm, shoulder, chest, face  . History of MI (myocardial infarction)    10/ 2014-- s/p  PCI and stenting x2  . History of small bowel obstruction    fall 2017  s/p  abdominal lysis adhesions  (prior hx bowel obstruction total 8 times)  . Nocturia   . S/P right coronary artery (RCA) stent placement    10/ 2014  x2  . Type 2 diabetes, diet controlled ([place])   . Wears glasses   . Wears hearing aid    bilateral    HOSPITAL COURSE:   Tyler Pena a 83 y.o.malewith a known history of Recurrent Bowel obstruction, arthritis, bladder cancer, prostate hypertrophy, coronary artery disease with cardiac stent, hyperlipidemia, hypertension, GERD, gastric ulcer, hiatal hernia presented to the emergency room with abdominal pain, nausea and  vomiting.Patient has nausea and vomiting for the last 1-2 days and also has distention of the belly.He was here 10 days ago for bowel obstruction  1.Recurrent small bowel obstruction Suspected secondary to adhesions from previous multiple surgeries Conservative management for now -NG tube removed. Patient had 2 bowel movements. - appreciate surgical consultation with Dr. Burt Knack  -DC IV fluids. Advance diet to full liquids for lunch and soft for supper - serial abdominal x-rays shows improvement  2.Abdominal distention and Abdominal pain Due to 1.   3.Intractable nausea and vomiting - now improved  4.CAD Stable.  Resume home meds  Overall stable for d/c Ua done today negative for UTi  CONSULTS OBTAINED:  Treatment Team:  Florene Glen, MD  DRUG ALLERGIES:  No Known Allergies  DISCHARGE MEDICATIONS:   Current Discharge Medication List    CONTINUE these medications which have NOT CHANGED   Details  aspirin EC 81 MG tablet Take 81 mg by mouth daily.    Black Pepper-Turmeric 3-500 MG CAPS Take 2 capsules by mouth daily.    Chromium-Cinnamon (CINNAMON PLUS CHROMIUM PO) Take 1 tablet by mouth 2 (two) times daily. 1000mg  cinnamon/  250mg  chromium    finasteride (PROSCAR) 5 MG tablet Take 5 mg by mouth every evening.     hydrochlorothiazide (HYDRODIURIL) 25 MG tablet Take 12.5 mg by mouth every morning.     losartan (COZAAR) 50  MG tablet Take 50 mg by mouth every evening.     metoprolol tartrate (LOPRESSOR) 25 MG tablet Take 50 mg by mouth every evening.     Multiple Vitamin (MULTIVITAMIN WITH MINERALS) TABS tablet Take 1 tablet by mouth daily.    Omega-3 Fatty Acids (SUPER OMEGA 3 EPA/DHA) 1000 MG CAPS Take 1 capsule by mouth daily. Omega 3 - DHA - EPA - Fish oil-- 1000mg / 120mg / 180mg     pantoprazole (PROTONIX) 40 MG tablet Take 40 mg by mouth every evening.     tamsulosin (FLOMAX) 0.4 MG CAPS capsule Take 0.4 mg by mouth every evening.       STOP  taking these medications     cephALEXin (KEFLEX) 500 MG capsule      phenazopyridine (PYRIDIUM) 200 MG tablet         If you experience worsening of your admission symptoms, develop shortness of breath, life threatening emergency, suicidal or homicidal thoughts you must seek medical attention immediately by calling 911 or calling your MD immediately  if symptoms less severe.  You Must read complete instructions/literature along with all the possible adverse reactions/side effects for all the Medicines you take and that have been prescribed to you. Take any new Medicines after you have completely understood and accept all the possible adverse reactions/side effects.   Please note  You were cared for by a hospitalist during your hospital stay. If you have any questions about your discharge medications or the care you received while you were in the hospital after you are discharged, you can call the unit and asked to speak with the hospitalist on call if the hospitalist that took care of you is not available. Once you are discharged, your primary care physician will handle any further medical issues. Please note that NO REFILLS for any discharge medications will be authorized once you are discharged, as it is imperative that you return to your primary care physician (or establish a relationship with a primary care physician if you do not have one) for your aftercare needs so that they can reassess your need for medications and monitor your lab values. Today   SUBJECTIVE    Doing well VITAL SIGNS:  Blood pressure 118/68, pulse 64, temperature 97.7 F (36.5 C), temperature source Oral, resp. rate 18, height 5\' 11"  (1.803 m), weight 90.3 kg (199 lb), SpO2 99 %.  I/O:   Intake/Output Summary (Last 24 hours) at 11/09/16 1240 Last data filed at 11/09/16 1044  Gross per 24 hour  Intake             1440 ml  Output              500 ml  Net              940 ml    PHYSICAL EXAMINATION:   GENERAL:  83 y.o.-year-old patient lying in the bed with no acute distress.  EYES: Pupils equal, round, reactive to light and accommodation. No scleral icterus. Extraocular muscles intact.  HEENT: Head atraumatic, normocephalic. Oropharynx and nasopharynx clear.  NECK:  Supple, no jugular venous distention. No thyroid enlargement, no tenderness.  LUNGS: Normal breath sounds bilaterally, no wheezing, rales,rhonchi or crepitation. No use of accessory muscles of respiration.  CARDIOVASCULAR: S1, S2 normal. No murmurs, rubs, or gallops.  ABDOMEN: Soft, non-tender, non-distended. Bowel sounds present. No organomegaly or mass.  EXTREMITIES: No pedal edema, cyanosis, or clubbing.  NEUROLOGIC: Cranial nerves II through XII are intact. Muscle strength 5/5 in all  extremities. Sensation intact. Gait not checked.  PSYCHIATRIC: The patient is alert and oriented x 3.  SKIN: No obvious rash, lesion, or ulcer.   DATA REVIEW:   CBC   Recent Labs Lab 11/07/16 0030  WBC 12.6*  HGB 14.1  HCT 41.1  PLT 231    Chemistries   Recent Labs Lab 11/07/16 0030  NA 135  K 3.6  CL 96*  CO2 28  GLUCOSE 200*  BUN 22*  CREATININE 1.06  CALCIUM 9.5  AST 22  ALT 15*  ALKPHOS 62  BILITOT 0.8    Microbiology Results   No results found for this or any previous visit (from the past 240 hour(s)).  RADIOLOGY:  Dg Abd 2 Views  Result Date: 11/08/2016 CLINICAL DATA:  Small bowel obstruction. Assess for residual contrast. EXAM: ABDOMEN - 2 VIEW COMPARISON:  Portable supine abdominal radiograph of Nov 07, 2016 and abdominal series of later the same day following nasogastric tube placement. FINDINGS: The orogastric tube tip projects in the region of the pylorus or duodenal bulb. There is a small amount of gas within normal caliber to mildly distended small bowel loops similar to that seen on yesterday's study. There is contrast within the splenic flexure and descending colon with a small amount of gas and  contrast and stool in the rectosigmoid. No free extraluminal gas collections are observed. IMPRESSION: Fairly stable appearance of the abdomen. Contrast and stool in the left colon. There is persistent minimal gaseous distention of small-bowel loops. Electronically Signed   By: David  Martinique M.D.   On: 11/08/2016 08:13   Dg Abd 2 Views  Result Date: 11/07/2016 CLINICAL DATA:  Recurrent bowel obstruction. Abdominal pain, nausea, vomiting. Distension of the abdomen. Last bowel movement this morning. History of abdominal hernia. EXAM: ABDOMEN - 2 VIEW COMPARISON:  11/07/2016 FINDINGS: Nasogastric tube tip overlies the level of the distal stomach. There is large amount of stool within nondilated loops of colon. There is paucity of small bowel gas, limiting evaluation of the small bowel loops. Contrast is identified within the urinary bladder following CT of the abdomen and pelvis today. Patient has had previous left hip arthroplasty. IMPRESSION: 1. No evidence for free intraperitoneal air. 2. Moderate stool burden. 3. Bowel gas pattern is nonobstructive. However, paucity of small bowel gas limits evaluation of the small bowel loops. Electronically Signed   By: Nolon Nations M.D.   On: 11/07/2016 14:27     Management plans discussed with the patient, family and they are in agreement.  CODE STATUS:     Code Status Orders        Start     Ordered   11/07/16 [phone]  Do not attempt resuscitation (DNR)  Continuous    Question Answer Comment  In the event of cardiac or respiratory ARREST Do not call a "code blue"   In the event of cardiac or respiratory ARREST Do not perform Intubation, CPR, defibrillation or ACLS   In the event of cardiac or respiratory ARREST Use medication by any route, position, wound care, and other measures to relive pain and suffering. May use oxygen, suction and manual treatment of airway obstruction as needed for comfort.      11/07/16 6295    Code Status History    Date  Active Date Inactive Code Status Order ID Comments User Context   10/28/2016 12:22 PM 10/30/2016  1:17 PM DNR [id number]  Loletha Grayer, MD ED   10/07/2016 11:50 PM 10/08/2016  4:32 PM Full  Code [id number]  Harvie Bridge, DO Inpatient    Advance Directive Documentation     Most Recent Value  Type of Advance Directive  Healthcare Power of Attorney  Pre-existing out of facility DNR order (yellow form or pink MOST form)  -  "MOST" Form in Place?  -      TOTAL TIME TAKING CARE OF THIS PATIENT: *40* minutes.    Kinsey Karch M.D on 11/09/2016 at 12:40 PM  Between 7am to 6pm - Pager - [phone] After 6pm go to [email or web address] - password [email or web address] [facility]  Office  [phone]  CC: Primary care physician; Dion Body, MD

## 2016-11-09 NOTE — Progress Notes (Signed)
CC: Chief complaint today is urinary tract symptoms Subjective: Patient is describing urinary tract symptoms. He has had urinary tract infections in the past and states that this is very similar and has had a year urinalysis that is being evaluated by prime doc.  He states he has no nausea vomiting no fevers or chills and is having bowel movements and no abdominal pain he is passing gas. He believes his small bowel obstruction has resolved.  Objective: Vital signs in last 24 hours: Temp:  [97.4 F (36.3 C)-97.7 F (36.5 C)] 97.6 F (36.4 C) (05/16 0556) Pulse Rate:  [54-58] 58 (05/16 0556) Resp:  [16-17] 16 (05/16 0556) BP: (112-137)/(60-74) 137/74 (05/16 0556) SpO2:  [98 %-100 %] 98 % (05/16 0556) Last BM Date: 11/08/16  Intake/Output from previous day: 05/15 0701 - 05/16 0700 In: 2570 [P.O.:1920; I.V.:650] Out: 550 [Urine:550] Intake/Output this shift: No intake/output data recorded.  Physical exam:  Awake alert and oriented vital signs are stable and afebrile abdomen is soft distended but not tympanitic and nontender  Lab Results: CBC   Recent Labs  11/07/16 0030  WBC 12.6*  HGB 14.1  HCT 41.1  PLT 231   BMET  Recent Labs  11/07/16 0030  NA 135  K 3.6  CL 96*  CO2 28  GLUCOSE 200*  BUN 22*  CREATININE 1.06  CALCIUM 9.5   PT/INR No results for input(s): LABPROT, INR in the last 72 hours. ABG No results for input(s): PHART, HCO3 in the last 72 hours.  Invalid input(s): PCO2, PO2  Studies/Results: Dg Abd 2 Views  Result Date: 11/08/2016 CLINICAL DATA:  Small bowel obstruction. Assess for residual contrast. EXAM: ABDOMEN - 2 VIEW COMPARISON:  Portable supine abdominal radiograph of Nov 07, 2016 and abdominal series of later the same day following nasogastric tube placement. FINDINGS: The orogastric tube tip projects in the region of the pylorus or duodenal bulb. There is a small amount of gas within normal caliber to mildly distended small bowel loops  similar to that seen on yesterday's study. There is contrast within the splenic flexure and descending colon with a small amount of gas and contrast and stool in the rectosigmoid. No free extraluminal gas collections are observed. IMPRESSION: Fairly stable appearance of the abdomen. Contrast and stool in the left colon. There is persistent minimal gaseous distention of small-bowel loops. Electronically Signed   By: David  Martinique M.D.   On: 11/08/2016 08:13   Dg Abd 2 Views  Result Date: 11/07/2016 CLINICAL DATA:  Recurrent bowel obstruction. Abdominal pain, nausea, vomiting. Distension of the abdomen. Last bowel movement this morning. History of abdominal hernia. EXAM: ABDOMEN - 2 VIEW COMPARISON:  11/07/2016 FINDINGS: Nasogastric tube tip overlies the level of the distal stomach. There is large amount of stool within nondilated loops of colon. There is paucity of small bowel gas, limiting evaluation of the small bowel loops. Contrast is identified within the urinary bladder following CT of the abdomen and pelvis today. Patient has had previous left hip arthroplasty. IMPRESSION: 1. No evidence for free intraperitoneal air. 2. Moderate stool burden. 3. Bowel gas pattern is nonobstructive. However, paucity of small bowel gas limits evaluation of the small bowel loops. Electronically Signed   By: Nolon Nations M.D.   On: 11/07/2016 14:27    Anti-infectives: Anti-infectives    None      Assessment/Plan:  Patient doing well and his small bowel obstruction has resolved. I will leave any discharge planning up to the prime doc team  concerning his urinary tract infection but at this time his SBO has resolved and I see no need to be in the hospital from that standpoint alone. He will follow up on an as-needed basis he does not need to be seen in our office unless he has additional symptoms.  Florene Glen, MD, FACS  11/09/2016

## 2016-11-28 DIAGNOSIS — E119 Type 2 diabetes mellitus without complications: Secondary | ICD-10-CM | POA: Insufficient documentation

## 2017-08-14 ENCOUNTER — Ambulatory Visit (INDEPENDENT_AMBULATORY_CARE_PROVIDER_SITE_OTHER): Payer: Medicare Other | Admitting: Podiatry

## 2017-08-14 ENCOUNTER — Encounter: Payer: Self-pay | Admitting: Podiatry

## 2017-08-14 VITALS — BP 141/68 | HR 63

## 2017-08-14 DIAGNOSIS — M79674 Pain in right toe(s): Secondary | ICD-10-CM | POA: Diagnosis not present

## 2017-08-14 DIAGNOSIS — B351 Tinea unguium: Secondary | ICD-10-CM

## 2017-08-14 DIAGNOSIS — M79675 Pain in left toe(s): Secondary | ICD-10-CM | POA: Diagnosis not present

## 2017-08-14 NOTE — Progress Notes (Signed)
   Subjective:    Patient ID: Tyler Pena, male    DOB: [birth year], 84 y.o.   MRN: [id number]  HPI his patient presents the office with chief complaint of long thick nails.  This patient states his nails are painful walking and wearing his shoes.  He says his nails have not been treated in over a year.  He says he desires evaluation and treatment of these painful nails.   Review of Systems  HENT: Positive for hearing loss and tinnitus.   Cardiovascular: Positive for leg swelling.  Hematological: Bruises/bleeds easily.  All other systems reviewed and are negative.      Objective:   Physical Exam General Appearance  Alert, conversant and in no acute stress.  Vascular  Dorsalis pedis and posterior pulses are palpable  bilaterally.  Capillary return is within normal limits  bilaterally. Temperature is within normal limits  Bilaterally.  Neurologic  Senn-Weinstein monofilament wire test within normal limits  bilaterally. Muscle power within normal limits bilaterally.  Nails Thick disfigured discolored nails with subungual debris bilaterally from hallux to fifth toes bilaterally. No evidence of bacterial infection or drainage bilaterally.  Orthopedic  No limitations of motion of motion feet bilaterally.  No crepitus or effusions noted.  Bony exostosis 1st MCJ  B/L.  Skin  normotropic skin with no porokeratosis noted bilaterally.  No signs of infections or ulcers noted.          Assessment & Plan:  Onychomycosis  B/L  IE  Debridement of mycotic nails  RTC 3 months.   Gardiner Barefoot DPM

## 2017-11-13 ENCOUNTER — Encounter: Payer: Self-pay | Admitting: Podiatry

## 2017-11-13 ENCOUNTER — Ambulatory Visit (INDEPENDENT_AMBULATORY_CARE_PROVIDER_SITE_OTHER): Payer: Medicare Other | Admitting: Podiatry

## 2017-11-13 DIAGNOSIS — M79675 Pain in left toe(s): Secondary | ICD-10-CM | POA: Diagnosis not present

## 2017-11-13 DIAGNOSIS — M79674 Pain in right toe(s): Secondary | ICD-10-CM

## 2017-11-13 DIAGNOSIS — B351 Tinea unguium: Secondary | ICD-10-CM | POA: Diagnosis not present

## 2017-11-13 NOTE — Progress Notes (Signed)
Complaint:  Visit Type: Patient returns to my office for continued preventative foot care services. Complaint: Patient states" my nails have grown long and thick and become painful to walk and wear shoes" . The patient presents for preventative foot care services. No changes to ROS  Podiatric Exam: Vascular: dorsalis pedis and posterior tibial pulses are palpable bilateral. Capillary return is immediate. Temperature gradient is WNL. Skin turgor WNL  Sensorium: Normal Semmes Weinstein monofilament test. Normal tactile sensation bilaterally. Nail Exam: Pt has thick disfigured discolored nails with subungual debris noted bilateral entire nail hallux through fifth toenails Ulcer Exam: There is no evidence of ulcer or pre-ulcerative changes or infection. Orthopedic Exam: Muscle tone and strength are WNL. No limitations in general ROM. No crepitus or effusions noted. Foot type and digits show no abnormalities. Bony prominences are unremarkable. Skin: No Porokeratosis. No infection or ulcers  Diagnosis:  Onychomycosis, , Pain in right toe, pain in left toes  Treatment & Plan Procedures and Treatment: Consent by patient was obtained for treatment procedures.   Debridement of mycotic and hypertrophic toenails, 1 through 5 bilateral and clearing of subungual debris. No ulceration, no infection noted.  Return Visit-Office Procedure: Patient instructed to return to the office for a follow up visit 3 months for continued evaluation and treatment.    Tiyanna Larcom DPM 

## 2018-04-23 ENCOUNTER — Ambulatory Visit (INDEPENDENT_AMBULATORY_CARE_PROVIDER_SITE_OTHER): Payer: Medicare Other | Admitting: Podiatry

## 2018-04-23 ENCOUNTER — Encounter: Payer: Self-pay | Admitting: Podiatry

## 2018-04-23 DIAGNOSIS — M79675 Pain in left toe(s): Secondary | ICD-10-CM | POA: Diagnosis not present

## 2018-04-23 DIAGNOSIS — M79674 Pain in right toe(s): Secondary | ICD-10-CM | POA: Diagnosis not present

## 2018-04-23 DIAGNOSIS — B351 Tinea unguium: Secondary | ICD-10-CM | POA: Diagnosis not present

## 2018-04-23 NOTE — Progress Notes (Signed)
Complaint:  Visit Type: Patient returns to my office for continued preventative foot care services. Complaint: Patient states" my nails have grown long and thick and become painful to walk and wear shoes" . The patient presents for preventative foot care services. No changes to ROS  Podiatric Exam: Vascular: dorsalis pedis and posterior tibial pulses are palpable bilateral. Capillary return is immediate. Temperature gradient is WNL. Skin turgor WNL  Sensorium: Normal Semmes Weinstein monofilament test. Normal tactile sensation bilaterally. Nail Exam: Pt has thick disfigured discolored nails with subungual debris noted bilateral entire nail hallux through fifth toenails Ulcer Exam: There is no evidence of ulcer or pre-ulcerative changes or infection. Orthopedic Exam: Muscle tone and strength are WNL. No limitations in general ROM. No crepitus or effusions noted. Foot type and digits show no abnormalities. Bony prominences are unremarkable. Skin: No Porokeratosis. No infection or ulcers  Diagnosis:  Onychomycosis, , Pain in right toe, pain in left toes  Treatment & Plan Procedures and Treatment: Consent by patient was obtained for treatment procedures.   Debridement of mycotic and hypertrophic toenails, 1 through 5 bilateral and clearing of subungual debris. No ulceration, no infection noted.  Return Visit-Office Procedure: Patient instructed to return to the office for a follow up visit 3 months for continued evaluation and treatment.    Morgin Halls DPM 

## 2018-07-23 ENCOUNTER — Encounter: Payer: Self-pay | Admitting: Podiatry

## 2018-07-23 ENCOUNTER — Ambulatory Visit (INDEPENDENT_AMBULATORY_CARE_PROVIDER_SITE_OTHER): Payer: Medicare Other | Admitting: Podiatry

## 2018-07-23 DIAGNOSIS — M79675 Pain in left toe(s): Secondary | ICD-10-CM | POA: Diagnosis not present

## 2018-07-23 DIAGNOSIS — M79674 Pain in right toe(s): Secondary | ICD-10-CM

## 2018-07-23 DIAGNOSIS — B351 Tinea unguium: Secondary | ICD-10-CM | POA: Diagnosis not present

## 2018-07-23 NOTE — Progress Notes (Signed)
Complaint:  Visit Type: Patient returns to my office for continued preventative foot care services. Complaint: Patient states" my nails have grown long and thick and become painful to walk and wear shoes" . The patient presents for preventative foot care services. No changes to ROS  Podiatric Exam: Vascular: dorsalis pedis and posterior tibial pulses are palpable bilateral. Capillary return is immediate. Temperature gradient is WNL. Skin turgor WNL  Sensorium: Normal Semmes Weinstein monofilament test. Normal tactile sensation bilaterally. Nail Exam: Pt has thick disfigured discolored nails with subungual debris noted bilateral entire nail hallux through fifth toenails Ulcer Exam: There is no evidence of ulcer or pre-ulcerative changes or infection. Orthopedic Exam: Muscle tone and strength are WNL. No limitations in general ROM. No crepitus or effusions noted. Foot type and digits show no abnormalities. Bony prominences 1st MCJ  B/L. Skin: No Porokeratosis. No infection or ulcers  Diagnosis:  Onychomycosis, , Pain in right toe, pain in left toes  Treatment & Plan Procedures and Treatment: Consent by patient was obtained for treatment procedures.   Debridement of mycotic and hypertrophic toenails, 1 through 5 bilateral and clearing of subungual debris. No ulceration, no infection noted.  Return Visit-Office Procedure: Patient instructed to return to the office for a follow up visit 3 months for continued evaluation and treatment.    Gardiner Barefoot DPM

## 2018-09-17 DIAGNOSIS — I440 Atrioventricular block, first degree: Secondary | ICD-10-CM | POA: Insufficient documentation

## 2018-09-21 ENCOUNTER — Ambulatory Visit: Payer: Medicare Other | Admitting: Podiatry

## 2018-10-01 ENCOUNTER — Ambulatory Visit: Payer: Medicare Other | Admitting: Podiatry

## 2018-10-25 ENCOUNTER — Ambulatory Visit: Payer: Medicare Other | Admitting: Podiatry

## 2018-10-29 ENCOUNTER — Other Ambulatory Visit: Payer: Self-pay

## 2018-10-29 ENCOUNTER — Ambulatory Visit (INDEPENDENT_AMBULATORY_CARE_PROVIDER_SITE_OTHER): Payer: Medicare Other | Admitting: Podiatry

## 2018-10-29 ENCOUNTER — Encounter: Payer: Self-pay | Admitting: Podiatry

## 2018-10-29 VITALS — Temp 98.2°F

## 2018-10-29 DIAGNOSIS — M79674 Pain in right toe(s): Secondary | ICD-10-CM

## 2018-10-29 DIAGNOSIS — M79675 Pain in left toe(s): Secondary | ICD-10-CM | POA: Diagnosis not present

## 2018-10-29 DIAGNOSIS — B351 Tinea unguium: Secondary | ICD-10-CM | POA: Diagnosis not present

## 2018-10-29 NOTE — Progress Notes (Signed)
Complaint:  Visit Type: Patient returns to my office for continued preventative foot care services. Complaint: Patient states" my nails have grown long and thick and become painful to walk and wear shoes" . The patient presents for preventative foot care services. No changes to ROS  Podiatric Exam: Vascular: dorsalis pedis and posterior tibial pulses are palpable bilateral. Capillary return is immediate. Temperature gradient is WNL. Skin turgor WNL  Sensorium: Normal Semmes Weinstein monofilament test. Normal tactile sensation bilaterally. Nail Exam: Pt has thick disfigured discolored nails with subungual debris noted bilateral entire nail hallux through fifth toenails Ulcer Exam: There is no evidence of ulcer or pre-ulcerative changes or infection. Orthopedic Exam: Muscle tone and strength are WNL. No limitations in general ROM. No crepitus or effusions noted. Foot type and digits show no abnormalities. Bony prominences 1st MCJ  B/L. Skin: No Porokeratosis. No infection or ulcers  Diagnosis:  Onychomycosis, , Pain in right toe, pain in left toes  Treatment & Plan Procedures and Treatment: Consent by patient was obtained for treatment procedures.   Debridement of mycotic and hypertrophic toenails, 1 through 5 bilateral and clearing of subungual debris. No ulceration, no infection noted.  Return Visit-Office Procedure: Patient instructed to return to the office for a follow up visit prn  for continued evaluation and treatment.    Gardiner Barefoot DPM

## 2018-11-06 ENCOUNTER — Other Ambulatory Visit: Payer: Self-pay | Admitting: Family Medicine

## 2018-11-06 DIAGNOSIS — I712 Thoracic aortic aneurysm, without rupture, unspecified: Secondary | ICD-10-CM

## 2018-11-14 ENCOUNTER — Other Ambulatory Visit: Payer: Self-pay

## 2018-11-14 ENCOUNTER — Ambulatory Visit
Admission: RE | Admit: 2018-11-14 | Discharge: 2018-11-14 | Disposition: A | Discharge status: Home / Self Care | Payer: Medicare Other | Source: Ambulatory Visit | Attending: Family Medicine | Admitting: Family Medicine

## 2018-11-14 DIAGNOSIS — I712 Thoracic aortic aneurysm, without rupture, unspecified: Secondary | ICD-10-CM

## 2018-11-14 LAB — POCT I-STAT CREATININE: Creatinine, Ser: 1 mg/dL (ref 0.61–1.24)

## 2018-11-14 MED ORDER — IOHEXOL 350 MG/ML SOLN
75.0000 mL | Freq: Once | INTRAVENOUS | Status: AC | PRN
  Administered 2018-11-14: 75 mL via INTRAVENOUS

## 2019-01-07 ENCOUNTER — Ambulatory Visit: Payer: Medicare Other | Admitting: Podiatry

## 2019-05-30 ENCOUNTER — Ambulatory Visit: Payer: Medicare Other | Admitting: Podiatry

## 2019-06-03 DIAGNOSIS — Z66 Do not resuscitate: Secondary | ICD-10-CM | POA: Insufficient documentation

## 2019-06-17 ENCOUNTER — Ambulatory Visit: Payer: Medicare Other | Admitting: Podiatry

## 2019-09-23 ENCOUNTER — Ambulatory Visit: Payer: Medicare Other | Admitting: Dermatology

## 2019-09-30 ENCOUNTER — Ambulatory Visit: Payer: Medicare Other | Admitting: Dermatology

## 2019-10-02 ENCOUNTER — Ambulatory Visit (INDEPENDENT_AMBULATORY_CARE_PROVIDER_SITE_OTHER): Payer: Medicare Other | Admitting: Dermatology

## 2019-10-02 ENCOUNTER — Encounter: Payer: Self-pay | Admitting: Dermatology

## 2019-10-02 ENCOUNTER — Other Ambulatory Visit: Payer: Self-pay

## 2019-10-02 DIAGNOSIS — L720 Epidermal cyst: Secondary | ICD-10-CM | POA: Diagnosis not present

## 2019-10-02 DIAGNOSIS — L03312 Cellulitis of back [any part except buttock]: Secondary | ICD-10-CM | POA: Diagnosis not present

## 2019-10-02 MED ORDER — DOXYCYCLINE HYCLATE 100 MG PO CAPS: 100.0000 mg | ORAL_CAPSULE | Freq: Two times a day (BID) | ORAL | 0 refills | Status: AC

## 2019-10-02 MED ORDER — MUPIROCIN CALCIUM 2 % EX CREA: TOPICAL_CREAM | CUTANEOUS | 0 refills | Status: DC

## 2019-10-02 NOTE — Patient Instructions (Signed)

## 2019-10-02 NOTE — Progress Notes (Signed)
   Follow-Up Visit   Subjective  Tyler Pena is a 86 y.o. male who presents for the following: Cyst (R upper back, pt was in the office for I&D 09/05/19 ) The spot has continue to be sore and swollen and painful and has drained a little bit.  He would like this evaluated.  The following portions of the chart were reviewed this encounter and updated as appropriate: Tobacco  Allergies  Meds  Problems  Med Hx  Surg Hx  Fam Hx      Review of Systems: No other skin or systemic complaints.  Objective  Well appearing patient in no apparent distress; mood and affect are within normal limits.  A focused examination was performed including back . Relevant physical exam findings are noted in the Assessment and Plan.  Objective  Right Upper Back: 2.0 cm pink nodule   Assessment & Plan  Epidermal cyst with rupture inflammation cellulitis and drainage.  It is persistent from previous I&D and has essentially recurred. Right Upper Back  Incision and Drainage - Right Upper Back Location: R upper back  Informed Consent: Discussed risks (permanent scarring, light or dark discoloration, infection, pain, bleeding, bruising, redness, damage to adjacent structures, and recurrence of the lesion) and benefits of the procedure, as well as the alternatives.  Informed consent was obtained.  Preparation: The area was prepped with alcohol.  Anesthesia: Lidocaine 1% with epinephrine  Procedure Details: An incision was made overlying the lesion. The lesion drained pus, white, chalky cyst material, clear fluid, clear, mucoid fluid, straw-colored fluid, blood.  A small amount of fluid was drained.    Antibiotic ointment and a sterile pressure dressing were applied. The patient tolerated procedure well.  Total number of lesions drained: 1  Plan: The patient was instructed on post-op care. Recommend OTC analgesia as needed for pain.   doxycycline (VIBRAMYCIN) 100 MG capsule - Right Upper  Back  mupirocin cream (BACTROBAN) 2 % - Right Upper Back  Return in about 4 weeks (around 10/30/2019) for cyst .   I, Marye Round, CMA, am acting as scribe for Sarina Ser, MD . Documentation: I have reviewed the above documentation for accuracy and completeness, and I agree with the above.  Sarina Ser, MD

## 2019-10-21 ENCOUNTER — Ambulatory Visit (INDEPENDENT_AMBULATORY_CARE_PROVIDER_SITE_OTHER): Payer: Medicare Other | Admitting: Podiatry

## 2019-10-21 ENCOUNTER — Encounter: Payer: Self-pay | Admitting: Podiatry

## 2019-10-21 ENCOUNTER — Other Ambulatory Visit: Payer: Self-pay

## 2019-10-21 VITALS — Temp 97.1°F

## 2019-10-21 DIAGNOSIS — E119 Type 2 diabetes mellitus without complications: Secondary | ICD-10-CM

## 2019-10-21 DIAGNOSIS — B351 Tinea unguium: Secondary | ICD-10-CM | POA: Diagnosis not present

## 2019-10-21 DIAGNOSIS — M79675 Pain in left toe(s): Secondary | ICD-10-CM | POA: Diagnosis not present

## 2019-10-21 DIAGNOSIS — M79674 Pain in right toe(s): Secondary | ICD-10-CM

## 2019-10-21 NOTE — Progress Notes (Signed)
This patient returns to my office for at risk foot care.  This patient requires this care by a professional since this patient will be at risk due to having diabetes.   This patient is unable to cut nails himself since the patient cannot reach his nails.These nails are painful walking and wearing shoes.  This patient presents for at risk foot care today.  General Appearance  Alert, conversant and in no acute stress.  Vascular  Dorsalis pedis and posterior tibial  pulses are weakly  palpable  bilaterally.  Capillary return is within normal limits  bilaterally. Temperature is within normal limits  bilaterally.  Neurologic  Senn-Weinstein monofilament wire test within normal limits  bilaterally. Muscle power within normal limits bilaterally.  Nails Thick disfigured discolored nails with subungual debris  from hallux to fifth toes bilaterally. No evidence of bacterial infection or drainage bilaterally.  Orthopedic  No limitations of motion  feet .  No crepitus or effusions noted.  No bony pathology or digital deformities noted.  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  No signs of infections or ulcers noted.     Onychomycosis  Pain in right toes  Pain in left toes  Consent was obtained for treatment procedures.   Mechanical debridement of nails 1-5  bilaterally performed with a nail nipper.  Filed with dremel without incident.    Return office visit   4 months                  Told patient to return for periodic foot care and evaluation due to potential at risk complications.   Gardiner Barefoot DPM

## 2019-11-06 ENCOUNTER — Ambulatory Visit (INDEPENDENT_AMBULATORY_CARE_PROVIDER_SITE_OTHER): Payer: Medicare Other | Admitting: Dermatology

## 2019-11-06 ENCOUNTER — Other Ambulatory Visit: Payer: Self-pay

## 2019-11-06 DIAGNOSIS — D489 Neoplasm of uncertain behavior, unspecified: Secondary | ICD-10-CM

## 2019-11-06 DIAGNOSIS — C44529 Squamous cell carcinoma of skin of other part of trunk: Secondary | ICD-10-CM

## 2019-11-06 NOTE — Patient Instructions (Signed)

## 2019-11-06 NOTE — Progress Notes (Signed)
   Follow-Up Visit   Subjective  Tyler Pena is a 86 y.o. male who presents for the following: Epidermal Cyst ( 5 wk F/U Right upper back, using Mupirocin ointment, still painful to the touch and itches.).  The following portions of the chart were reviewed this encounter and updated as appropriate:  Tobacco  Allergies  Meds  Problems  Med Hx  Surg Hx  Fam Hx     Review of Systems:  No other skin or systemic complaints except as noted in HPI or Assessment and Plan.  Objective  Well appearing patient in no apparent distress; mood and affect are within normal limits.  A focused examination was performed including face, neck, chest and back. Relevant physical exam findings are noted in the Assessment and Plan.  Objective  Right Upper Back: 3.2 cm  Red tender nodule with erythema, edema, with drainage        Assessment & Plan  Neoplasm of uncertain behavior Right Upper Back  Skin / nail biopsy Type of biopsy: punch   Informed consent: discussed and consent obtained   Timeout: patient name, date of birth, surgical site, and procedure verified   Procedure prep:  Patient was prepped and draped in usual sterile fashion (The patient was cleaned and prepped) Prep type:  Isopropyl alcohol Anesthesia: the lesion was anesthetized in a standard fashion   Anesthetic:  1% lidocaine w/ epinephrine 1-100,000 buffered w/ 8.4% NaHCO3 Hemostasis achieved with: pressure and aluminum chloride   Outcome: patient tolerated procedure well   Post-procedure details: sterile dressing applied and wound care instructions given   Dressing type: bandage, petrolatum and pressure dressing    Specimen 1 - Surgical pathology Differential Diagnosis: cyst vs Ca Check Margins: No 3.2 cm  Red tender nodule with erythema, edema, and drainage  5 mm punch biopsy today, no sutures required, pressure bandage applied  Cyst vs Ca    Return for f/u punch biopsy.  Marene Lenz, CMA, am acting as  scribe for Sarina Ser, MD   Documentation: I have reviewed the above documentation for accuracy and completeness, and I agree with the above.  Sarina Ser, MD

## 2019-11-13 ENCOUNTER — Telehealth: Payer: Self-pay

## 2019-11-13 NOTE — Telephone Encounter (Signed)
No answer

## 2019-11-14 ENCOUNTER — Encounter: Payer: Self-pay | Admitting: Dermatology

## 2019-11-21 ENCOUNTER — Other Ambulatory Visit: Payer: Self-pay

## 2019-11-21 ENCOUNTER — Ambulatory Visit (INDEPENDENT_AMBULATORY_CARE_PROVIDER_SITE_OTHER): Payer: Medicare Other | Admitting: Dermatology

## 2019-11-21 DIAGNOSIS — C4492 Squamous cell carcinoma of skin, unspecified: Secondary | ICD-10-CM

## 2019-11-21 DIAGNOSIS — L578 Other skin changes due to chronic exposure to nonionizing radiation: Secondary | ICD-10-CM | POA: Diagnosis not present

## 2019-11-21 DIAGNOSIS — C44529 Squamous cell carcinoma of skin of other part of trunk: Secondary | ICD-10-CM | POA: Diagnosis not present

## 2019-11-21 NOTE — Progress Notes (Signed)
   Follow-Up Visit   Subjective  Akio Simo is a 86 y.o. male who presents for the following: to discuss pathology results and treatment options for a large biopsy proven Squamous Cell Carcinoma of the back.  The following portions of the chart were reviewed this encounter and updated as appropriate:  Tobacco  Allergies  Meds  Problems  Med Hx  Surg Hx  Fam Hx     Review of Systems:  No other skin or systemic complaints except as noted in HPI or Assessment and Plan.  Objective  Well appearing patient in no apparent distress; mood and affect are within normal limits.  A focused examination was performed including the back. Relevant physical exam findings are noted in the Assessment and Plan.  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 and will not return until around November 2021. Therefore, he prefers to have the surgery done in [place]. A hardcopy of the pathology results were given to the patient as well as our contact information should his dermatology in [place] need anything.  Actinic Damage - diffuse scaly erythematous macules with underlying dyspigmentation - Recommend daily broad spectrum sunscreen SPF 30+ to sun-exposed areas, reapply every 2 hours as needed.  - Call for new or changing lesions.  Return - patient will contact our office when he returns from [place].  Luther Redo, CMA, am acting as scribe for Sarina Ser, MD .  Documentation: I have reviewed the above documentation for accuracy and completeness, and I agree with the above.  Sarina Ser, MD

## 2019-11-30 ENCOUNTER — Encounter: Payer: Self-pay | Admitting: Dermatology

## 2019-12-05 ENCOUNTER — Telehealth: Payer: Self-pay | Admitting: Dermatology

## 2019-12-05 NOTE — Telephone Encounter (Signed)
I called the patient and his daughter for discussion.  They had apparently come in the office last week when there were no doctors available (all doctors were out of the office that day due to vacation, not working that day, or on child leave) The daughter was concerned about the squamous cell carcinoma site of his back being sore and felt he needed evaluation. I wanted to follow-up and see if there is anything else I need to do.  The daughter stated that they ended up seeing his PCP, Dr. Netty Starring who prescribed some antibiotics and he is apparently doing better now. The patient was seen a couple weeks ago to discuss treatment options for his biopsy-proven squamous cell carcinoma of the back.  The area was doing fine without problems at that time.  He stated he was leaving for [place] in a few days and staying for several months and due to this, did not want to pursue surgery in [place] and would have his surgery in [place].  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  He has had skin cancer surgery in the past in [place].  He will get scheduled for a Mohs surgeon.  He was given a copy of his pathology report to take with him.  He was advised to contact us if he needed help with referral. The patient and daughter were actually traveling in a car to the airport for their trip to [place] during my discussion on the phone with them.  The daughter stated that he does have a surgery appointment scheduled in [place] with the Mohs surgeon.

## 2020-07-02 ENCOUNTER — Other Ambulatory Visit: Payer: Self-pay

## 2020-07-02 ENCOUNTER — Inpatient Hospital Stay
Admission: EM | Admit: 2020-07-02 | Discharge: 2020-07-04 | DRG: 187 | Disposition: A | Discharge status: Home Health | Payer: Medicare Other | Source: Skilled Nursing Facility | Attending: Internal Medicine | Admitting: Internal Medicine

## 2020-07-02 ENCOUNTER — Emergency Department: Payer: Medicare Other

## 2020-07-02 DIAGNOSIS — Z9889 Other specified postprocedural states: Secondary | ICD-10-CM

## 2020-07-02 DIAGNOSIS — Z8249 Family history of ischemic heart disease and other diseases of the circulatory system: Secondary | ICD-10-CM

## 2020-07-02 DIAGNOSIS — Z79899 Other long term (current) drug therapy: Secondary | ICD-10-CM | POA: Diagnosis not present

## 2020-07-02 DIAGNOSIS — J90 Pleural effusion, not elsewhere classified: Principal | ICD-10-CM

## 2020-07-02 DIAGNOSIS — E119 Type 2 diabetes mellitus without complications: Secondary | ICD-10-CM | POA: Diagnosis present

## 2020-07-02 DIAGNOSIS — Z85820 Personal history of malignant melanoma of skin: Secondary | ICD-10-CM

## 2020-07-02 DIAGNOSIS — I714 Abdominal aortic aneurysm, without rupture: Secondary | ICD-10-CM | POA: Diagnosis present

## 2020-07-02 DIAGNOSIS — S63289A Dislocation of proximal interphalangeal joint of unspecified finger, initial encounter: Secondary | ICD-10-CM

## 2020-07-02 DIAGNOSIS — N400 Enlarged prostate without lower urinary tract symptoms: Secondary | ICD-10-CM | POA: Diagnosis present

## 2020-07-02 DIAGNOSIS — Z96642 Presence of left artificial hip joint: Secondary | ICD-10-CM | POA: Diagnosis present

## 2020-07-02 DIAGNOSIS — Z20822 Contact with and (suspected) exposure to covid-19: Secondary | ICD-10-CM | POA: Diagnosis present

## 2020-07-02 DIAGNOSIS — Z974 Presence of external hearing-aid: Secondary | ICD-10-CM

## 2020-07-02 DIAGNOSIS — Z955 Presence of coronary angioplasty implant and graft: Secondary | ICD-10-CM | POA: Diagnosis not present

## 2020-07-02 DIAGNOSIS — S2241XA Multiple fractures of ribs, right side, initial encounter for closed fracture: Secondary | ICD-10-CM | POA: Diagnosis present

## 2020-07-02 DIAGNOSIS — Z8711 Personal history of peptic ulcer disease: Secondary | ICD-10-CM

## 2020-07-02 DIAGNOSIS — I440 Atrioventricular block, first degree: Secondary | ICD-10-CM | POA: Diagnosis present

## 2020-07-02 DIAGNOSIS — Z9049 Acquired absence of other specified parts of digestive tract: Secondary | ICD-10-CM

## 2020-07-02 DIAGNOSIS — Z8551 Personal history of malignant neoplasm of bladder: Secondary | ICD-10-CM | POA: Diagnosis not present

## 2020-07-02 DIAGNOSIS — W19XXXA Unspecified fall, initial encounter: Secondary | ICD-10-CM | POA: Diagnosis present

## 2020-07-02 DIAGNOSIS — I2510 Atherosclerotic heart disease of native coronary artery without angina pectoris: Secondary | ICD-10-CM | POA: Diagnosis present

## 2020-07-02 DIAGNOSIS — Z66 Do not resuscitate: Secondary | ICD-10-CM | POA: Diagnosis present

## 2020-07-02 DIAGNOSIS — E871 Hypo-osmolality and hyponatremia: Secondary | ICD-10-CM | POA: Diagnosis present

## 2020-07-02 DIAGNOSIS — N3000 Acute cystitis without hematuria: Secondary | ICD-10-CM | POA: Diagnosis present

## 2020-07-02 DIAGNOSIS — R55 Syncope and collapse: Secondary | ICD-10-CM | POA: Diagnosis present

## 2020-07-02 DIAGNOSIS — Z888 Allergy status to other drugs, medicaments and biological substances status: Secondary | ICD-10-CM | POA: Diagnosis not present

## 2020-07-02 DIAGNOSIS — E785 Hyperlipidemia, unspecified: Secondary | ICD-10-CM | POA: Diagnosis present

## 2020-07-02 DIAGNOSIS — I10 Essential (primary) hypertension: Secondary | ICD-10-CM | POA: Diagnosis present

## 2020-07-02 DIAGNOSIS — M4802 Spinal stenosis, cervical region: Secondary | ICD-10-CM | POA: Diagnosis present

## 2020-07-02 DIAGNOSIS — S63286A Dislocation of proximal interphalangeal joint of right little finger, initial encounter: Secondary | ICD-10-CM | POA: Diagnosis present

## 2020-07-02 DIAGNOSIS — K219 Gastro-esophageal reflux disease without esophagitis: Secondary | ICD-10-CM | POA: Diagnosis present

## 2020-07-02 DIAGNOSIS — Z87442 Personal history of urinary calculi: Secondary | ICD-10-CM

## 2020-07-02 DIAGNOSIS — Z7982 Long term (current) use of aspirin: Secondary | ICD-10-CM | POA: Diagnosis not present

## 2020-07-02 DIAGNOSIS — Z9079 Acquired absence of other genital organ(s): Secondary | ICD-10-CM

## 2020-07-02 DIAGNOSIS — I252 Old myocardial infarction: Secondary | ICD-10-CM | POA: Diagnosis not present

## 2020-07-02 DIAGNOSIS — S0181XA Laceration without foreign body of other part of head, initial encounter: Secondary | ICD-10-CM

## 2020-07-02 LAB — URINALYSIS, COMPLETE (UACMP) WITH MICROSCOPIC
Bilirubin Urine: NEGATIVE
Glucose, UA: NEGATIVE mg/dL
Hgb urine dipstick: NEGATIVE
Ketones, ur: 5 mg/dL — AB
Nitrite: NEGATIVE
Protein, ur: NEGATIVE mg/dL
Specific Gravity, Urine: 1.014 (ref 1.005–1.030)
pH: 5 (ref 5.0–8.0)

## 2020-07-02 LAB — COMPREHENSIVE METABOLIC PANEL
ALT: 10 U/L (ref 0–44)
AST: 19 U/L (ref 15–41)
Albumin: 3.6 g/dL (ref 3.5–5.0)
Alkaline Phosphatase: 91 U/L (ref 38–126)
Anion gap: 10 (ref 5–15)
BUN: 12 mg/dL (ref 8–23)
CO2: 31 mmol/L (ref 22–32)
Calcium: 8.7 mg/dL — ABNORMAL LOW (ref 8.9–10.3)
Chloride: 92 mmol/L — ABNORMAL LOW (ref 98–111)
Creatinine, Ser: 0.86 mg/dL (ref 0.61–1.24)
GFR, Estimated: >60 mL/min (ref >60)
Glucose, Bld: 157 mg/dL — ABNORMAL HIGH (ref 70–99)
Potassium: 3.5 mmol/L (ref 3.5–5.1)
Sodium: 133 mmol/L — ABNORMAL LOW (ref 135–145)
Total Bilirubin: 0.6 mg/dL (ref 0.3–1.2)
Total Protein: 7.5 g/dL (ref 6.5–8.1)

## 2020-07-02 LAB — CBC WITH DIFFERENTIAL/PLATELET
Abs Immature Granulocytes: 0.05 10*3/uL (ref 0.00–0.07)
Basophils Absolute: 0 10*3/uL (ref 0.0–0.1)
Basophils Relative: 0 %
Eosinophils Absolute: 0.1 10*3/uL (ref 0.0–0.5)
Eosinophils Relative: 1 %
HCT: 36.3 % — ABNORMAL LOW (ref 39.0–52.0)
Hemoglobin: 12 g/dL — ABNORMAL LOW (ref 13.0–17.0)
Immature Granulocytes: 0 %
Lymphocytes Relative: 14 %
Lymphs Abs: 1.8 10*3/uL (ref 0.7–4.0)
MCH: 29.3 pg (ref 26.0–34.0)
MCHC: 33.1 g/dL (ref 30.0–36.0)
MCV: 88.8 fL (ref 80.0–100.0)
Monocytes Absolute: 0.8 10*3/uL (ref 0.1–1.0)
Monocytes Relative: 6 %
Neutro Abs: 9.7 10*3/uL — ABNORMAL HIGH (ref 1.7–7.7)
Neutrophils Relative %: 79 %
Platelets: 264 10*3/uL (ref 150–400)
RBC: 4.09 MIL/uL — ABNORMAL LOW (ref 4.22–5.81)
RDW: 13.9 % (ref 11.5–15.5)
WBC: 12.5 10*3/uL — ABNORMAL HIGH (ref 4.0–10.5)
nRBC: 0 % (ref 0.0–0.2)

## 2020-07-02 LAB — TROPONIN I (HIGH SENSITIVITY)
Troponin I (High Sensitivity): 130 ng/L (ref <18)
Troponin I (High Sensitivity): 7 ng/L (ref <18)

## 2020-07-02 LAB — RESP PANEL BY RT-PCR (FLU A&B, COVID) ARPGX2
Influenza A by PCR: NEGATIVE
Influenza B by PCR: NEGATIVE
SARS Coronavirus 2 by RT PCR: NEGATIVE

## 2020-07-02 LAB — PROTIME-INR
INR: 1.1 (ref 0.8–1.2)
Prothrombin Time: 13.4 seconds (ref 11.4–15.2)

## 2020-07-02 LAB — BRAIN NATRIURETIC PEPTIDE: B Natriuretic Peptide: 94.4 pg/mL (ref 0.0–100.0)

## 2020-07-02 LAB — PROCALCITONIN: Procalcitonin: <0.1 ng/mL

## 2020-07-02 MED ORDER — ENOXAPARIN SODIUM 40 MG/0.4ML ~~LOC~~ SOLN
40.0000 mg | SUBCUTANEOUS | Status: DC
  Administered 2020-07-03 (×2): 40 mg via SUBCUTANEOUS
  Filled 2020-07-02 (×2): qty 0.4

## 2020-07-02 MED ORDER — PANTOPRAZOLE SODIUM 40 MG PO TBEC
40.0000 mg | DELAYED_RELEASE_TABLET | Freq: Every day | ORAL | Status: DC
  Administered 2020-07-03 – 2020-07-04 (×2): 40 mg via ORAL
  Filled 2020-07-02 (×2): qty 1

## 2020-07-02 MED ORDER — LIDOCAINE HCL (PF) 1 % IJ SOLN
5.0000 mL | Freq: Once | INTRAMUSCULAR | Status: AC
  Administered 2020-07-02: 5 mL via INTRADERMAL
  Filled 2020-07-02: qty 5

## 2020-07-02 MED ORDER — METOPROLOL TARTRATE 50 MG PO TABS
50.0000 mg | ORAL_TABLET | Freq: Every evening | ORAL | Status: DC
  Administered 2020-07-03 (×2): 50 mg via ORAL
  Filled 2020-07-02 (×2): qty 1

## 2020-07-02 MED ORDER — ASPIRIN EC 81 MG PO TBEC
81.0000 mg | DELAYED_RELEASE_TABLET | Freq: Every day | ORAL | Status: DC
  Administered 2020-07-03: 81 mg via ORAL
  Filled 2020-07-02 (×2): qty 1

## 2020-07-02 MED ORDER — ACETAMINOPHEN 325 MG PO TABS
650.0000 mg | ORAL_TABLET | Freq: Four times a day (QID) | ORAL | Status: DC | PRN
  Administered 2020-07-03 – 2020-07-04 (×2): 650 mg via ORAL
  Filled 2020-07-02 (×2): qty 2

## 2020-07-02 MED ORDER — CHROMIUM-CINNAMON 200-1000 MCG-MG PO CAPS: ORAL_CAPSULE | Freq: Two times a day (BID) | ORAL | Status: DC

## 2020-07-02 MED ORDER — TAMSULOSIN HCL 0.4 MG PO CAPS
0.4000 mg | ORAL_CAPSULE | Freq: Every evening | ORAL | Status: DC
  Administered 2020-07-03 (×2): 0.4 mg via ORAL
  Filled 2020-07-02 (×2): qty 1

## 2020-07-02 MED ORDER — FINASTERIDE 5 MG PO TABS
5.0000 mg | ORAL_TABLET | Freq: Every evening | ORAL | Status: DC
  Administered 2020-07-03 (×2): 5 mg via ORAL
  Filled 2020-07-02 (×3): qty 1

## 2020-07-02 MED ORDER — ADULT MULTIVITAMIN W/MINERALS CH
1.0000 | ORAL_TABLET | Freq: Every day | ORAL | Status: DC
  Administered 2020-07-03 – 2020-07-04 (×2): 1 via ORAL
  Filled 2020-07-02 (×2): qty 1

## 2020-07-02 MED ORDER — CIPROFLOXACIN HCL 500 MG PO TABS
250.0000 mg | ORAL_TABLET | Freq: Two times a day (BID) | ORAL | Status: AC
  Administered 2020-07-03 – 2020-07-04 (×4): 250 mg via ORAL
  Filled 2020-07-02 (×4): qty 1

## 2020-07-02 MED ORDER — ACETAMINOPHEN 650 MG RE SUPP: 650.0000 mg | Freq: Four times a day (QID) | RECTAL | Status: DC | PRN

## 2020-07-02 MED ORDER — ENOXAPARIN SODIUM 30 MG/0.3ML ~~LOC~~ SOLN: 30.0000 mg | SUBCUTANEOUS | Status: DC

## 2020-07-02 MED ORDER — LIDOCAINE-EPINEPHRINE-TETRACAINE (LET) TOPICAL GEL
3.0000 mL | Freq: Once | TOPICAL | Status: DC
  Filled 2020-07-02: qty 3

## 2020-07-02 MED ORDER — SUPER OMEGA 3 EPA/DHA 1000 MG PO CAPS: 1.0000 | ORAL_CAPSULE | Freq: Every day | ORAL | Status: DC

## 2020-07-02 MED ORDER — BLACK PEPPER-TURMERIC 3-500 MG PO CAPS: 2.0000 | ORAL_CAPSULE | Freq: Every day | ORAL | Status: DC

## 2020-07-02 MED ORDER — OMEGA-3-ACID ETHYL ESTERS 1 G PO CAPS
1.0000 g | ORAL_CAPSULE | Freq: Every day | ORAL | Status: DC
  Administered 2020-07-04: 1 g via ORAL
  Filled 2020-07-02 (×3): qty 1

## 2020-07-02 NOTE — ED Notes (Signed)
Report given to Viviann Spare RN pt admitted to 227

## 2020-07-02 NOTE — ED Notes (Signed)
Kennon Holter, PA notified of 2nd trop results

## 2020-07-02 NOTE — H&P (Signed)
History and Physical    Tyler Pena [id number] DOB: 05/07/1921 DOA: 07/02/2020  PCP: Dion Body, MD (Confirm with patient/family/NH records and if not entered, this has to be entered at [facility] point of entry) Patient coming from: Assisted living  I have personally briefly reviewed patient's old medical records in [place]  Chief Complaint: Shortness of breath  HPI: Tyler Pena is a 87 y.o. 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  Patient had an episode of UTI earlier this week, symptoms involving dysuria and visual hallucination, has been treated by his PCP with Cipro twice daily for last 3 days and feeling much better symptomatic wise.  This afternoon is working in the parking lot and suddenly fell " about to fall" and try to grab something and then fall on the right side and landed on his right side and hit his head on the right side with laceration.  No loss of consciousness.  Patient recently moved to live by himself in assisted living, and 2 weeks ago patient underwent a physical exam when he was told " decreasing breathing sound on the right side".  Starting from about 1 week or so, patient started to feel increasing shortness of breath and night, could not lie on his left side, and minimum exertion can trigger shortness breath.  Denied any chest pain, no leg swelling no cough no fever chills.  Patient had a large mass develop on his right-sided upper back, underwent biopsy proved to be squamous cell carcinoma and then to have excision in [place] July 2021.  He was told no need for radiation or chemotherapy at that time.  ED Course: Vital signs showed patient has borderline low BP, CT head negative, CT neck showed central canal stenosis  C3-C4 and C6-C7, and apex opacification indicating large right-sided pleural effusion.  Right fifth finger ITP dislocation, chest xray, 8th rib frx. Trop 7>130, no chest pain.  Review of Systems: As per HPI otherwise 14 point review of systems negative.    Past Medical History:  Diagnosis Date  . Arthritis   . Bilateral renal cysts   . Bladder cancer [facility])    urologist-  dr Karsten Ro  . BPH (benign prostatic hyperplasia)   . Coronary artery disease    hx PCI w/ stenting in 10/ 2014 in [place]--- recently moved from [place] jan 2018 has not established a cardiologist yet but does have pcp   . Dyslipidemia   . Essential hypertension   . GERD (gastroesophageal reflux disease)   . Hematuria   . History of gastric ulcer   . History of hiatal hernia   . History of kidney stones   . History of melanoma in situ    several Excision's in situ and malignant melanoma's--  last one 06/ 2017 top of ear excision Stage 1 w/ negative margins/  other have been right upper arm, shoulder, chest, face  . History of MI (myocardial infarction)    10/ 2014-- s/p  PCI and stenting x2  . History of small bowel obstruction    fall 2017  s/p  abdominal lysis adhesions  (prior hx bowel obstruction total 8 times)  . Nocturia   . S/P right coronary artery (RCA) stent placement    10/ 2014  x2  . Type 2 diabetes, diet controlled (HCC)   . Wears glasses   . Wears hearing aid    bilateral    Past Surgical History:  Procedure Laterality Date  . ABDOMINAL HERNIA REPAIR  2012  . ADDOMINAL LYSIS ADHESIONS  fall 2017   sbo  . CHOLECYSTECTOMY OPEN  1971   and Appendectomy  . CORONARY ANGIOPLASTY WITH STENT PLACEMENT  04-17-2013  in [place]   x2 stents to RCA  . INGUINAL HERNIA REPAIR Bilateral 1995  . TOTAL HIP ARTHROPLASTY Left 2008  . TRANSURETHRAL RESECTION OF BLADDER TUMOR  09/2006   LEIOMYOMA   . TRANSURETHRAL RESECTION OF BLADDER TUMOR WITH MITOMYCIN-C N/A 09/02/2016   Procedure: TRANSURETHRAL  RESECTION OF BLADDER TUMOR WITH MITOMYCIN-C;  Surgeon: Ihor Gully, MD;  Location: [facility];  Service: Urology;  Laterality: N/A;     reports that he has never smoked. He has never used smokeless tobacco. He reports current alcohol use. He reports that he does not use drugs.  Allergies  Allergen Reactions  . Fenofibrate     Other reaction(s): Muscle Pain  . Losartan Other (See Comments)    Caused weakness, chest pressure, and shortness of breath    Family History  Problem Relation Age of Onset  . Cancer Mother   . CAD Father   . CAD Sister   . CAD Sister   . CAD Sister      Prior to Admission medications   Medication Sig Start Date End Date Taking? Authorizing Provider  acetaminophen (TYLENOL) 500 MG tablet Take by mouth.    [provider]  aspirin EC 81 MG tablet Take 81 mg by mouth daily.    [provider]  Black Pepper-Turmeric 3-500 MG CAPS Take 2 capsules by mouth daily.    [provider]  Chromium-Cinnamon (CINNAMON PLUS CHROMIUM PO) Take 1 tablet by mouth 2 (two) times daily. 1000mg  cinnamon/  250mg  chromium    [provider]  finasteride (PROSCAR) 5 MG tablet Take 5 mg by mouth every evening.     [provider]  hydrochlorothiazide (HYDRODIURIL) 25 MG tablet Take 12.5 mg by mouth every morning.     [provider]  lisinopril (ZESTRIL) 5 MG tablet Take 5 mg by mouth daily. 10/15/18   [provider]  losartan (COZAAR) 50 MG tablet Take 50 mg by mouth every evening.     [provider]  meloxicam (MOBIC) 15 MG tablet TAKE 1 TABLET (15 MG TOTAL) BY MOUTH DAILY WITH BREAKFAST 05/14/18   [provider]  metoprolol succinate (TOPROL-XL) 25 MG 24 hr tablet Take by mouth. 09/17/18 09/17/19  [provider]  metoprolol tartrate (LOPRESSOR) 25 MG tablet Take 50 mg by mouth every evening.     [provider]  Multiple Vitamin (MULTIVITAMIN WITH MINERALS) TABS  tablet Take 1 tablet by mouth daily.    [provider]  mupirocin cream (BACTROBAN) 2 % Apply to skin during each bandage change 10/02/19   09/19/19, MD  Omega-3 Fatty Acids (SUPER OMEGA 3 EPA/DHA) 1000 MG CAPS Take 1 capsule by mouth daily. Omega 3 - DHA - EPA - Fish oil-- 1000mg / 120mg / 180mg     [provider]  omeprazole (PRILOSEC) 20 MG capsule Take by mouth.    [provider]  pantoprazole (PROTONIX) 40 MG tablet Take 40 mg by mouth every evening.     [provider]  polyethylene glycol powder (GLYCOLAX/MIRALAX) 17 GM/SCOOP powder  Take by mouth.    [provider]  tamsulosin (FLOMAX) 0.4 MG CAPS capsule Take 0.4 mg by mouth every evening.     [provider]    Physical Exam: Vitals:   07/02/20 1624 07/02/20 1625 07/02/20 1626 07/02/20 1636  BP:   123/68   Pulse: 74     Resp:   18   Temp:    98.4 F (36.9 C)  TempSrc:    Oral  SpO2: 97%     Weight:  95.3 kg    Height:  6' (1.829 m)      Constitutional: NAD, calm, comfortable Vitals:   07/02/20 1624 07/02/20 1625 07/02/20 1626 07/02/20 1636  BP:   123/68   Pulse: 74     Resp:   18   Temp:    98.4 F (36.9 C)  TempSrc:    Oral  SpO2: 97%     Weight:  95.3 kg    Height:  6' (1.829 m)     Eyes: PERRL, lids and conjunctivae normal ENMT: Mucous membranes are moist. Posterior pharynx clear of any exudate or lesions.Normal dentition.  Neck: normal, supple, no masses, no thyromegaly Respiratory: Diminished breathing sound on the right side, no wheezing, no crackles. Normal respiratory effort. No accessory muscle use.  Cardiovascular: Regular rate and rhythm, no murmurs / rubs / gallops. No extremity edema. 2+ pedal pulses. No carotid bruits.  Abdomen: no tenderness, no masses palpated. No hepatosplenomegaly. Bowel sounds positive.  Musculoskeletal: Deformed right fifth finger Skin: no rashes, lesions, ulcers. No induration Neurologic: CN 2-12 grossly intact.  Sensation intact, DTR normal. Strength 5/5 in all 4.  Psychiatric: Normal judgment and insight. Alert and oriented x 3. Normal mood.     Labs on Admission: I have personally reviewed following labs and imaging studies  CBC: Recent Labs  Lab 07/02/20 1830  WBC 12.5*  NEUTROABS 9.7*  HGB 12.0*  HCT 36.3*  MCV 88.8  PLT [id number]   Basic Metabolic Panel: Recent Labs  Lab 07/02/20 1830  NA 133*  K 3.5  CL 92*  CO2 31  GLUCOSE 157*  BUN 12  CREATININE 0.86  CALCIUM 8.7*   GFR: Estimated Creatinine Clearance: 58.8 mL/min (by C-G formula based on SCr of 0.86 mg/dL). Liver Function Tests: Recent Labs  Lab 07/02/20 1830  AST 19  ALT 10  ALKPHOS 91  BILITOT 0.6  PROT 7.5  ALBUMIN 3.6   No results for input(s): LIPASE, AMYLASE in the last 168 hours. No results for input(s): AMMONIA in the last 168 hours. Coagulation Profile: Recent Labs  Lab 07/02/20 2140  INR 1.1   Cardiac Enzymes: No results for input(s): CKTOTAL, CKMB, CKMBINDEX, TROPONINI in the last 168 hours. BNP (last 3 results) No results for input(s): PROBNP in the last 8760 hours. HbA1C: No results for input(s): HGBA1C in the last 72 hours. CBG: No results for input(s): GLUCAP in the last 168 hours. Lipid Profile: No results for input(s): CHOL, HDL, LDLCALC, TRIG, CHOLHDL, LDLDIRECT in the last 72 hours. Thyroid Function Tests: No results for input(s): TSH, T4TOTAL, FREET4, T3FREE, THYROIDAB in the last 72 hours. Anemia Panel: No results for input(s): VITAMINB12, FOLATE, FERRITIN, TIBC, IRON, RETICCTPCT in the last 72 hours. Urine analysis:    Component Value Date/Time   COLORURINE YELLOW (A) 07/02/2020 2140   APPEARANCEUR HAZY (A) 07/02/2020 2140   LABSPEC 1.014 07/02/2020 2140   PHURINE 5.0 07/02/2020 2140   GLUCOSEU NEGATIVE 07/02/2020 2140   HGBUR NEGATIVE 07/02/2020 2140  BILIRUBINUR NEGATIVE 07/02/2020 2140   KETONESUR 5 (A) 07/02/2020 2140   PROTEINUR NEGATIVE 07/02/2020 2140   NITRITE  NEGATIVE 07/02/2020 2140   LEUKOCYTESUR LARGE (A) 07/02/2020 2140    Radiological Exams on Admission: DG Ribs Unilateral W/Chest Right  Result Date: 07/02/2020 CLINICAL DATA:  87 year old male with right chest wall pain. EXAM: RIGHT RIBS AND CHEST - 3+ VIEW COMPARISON:  Chest radiograph dated 10/07/2016. FINDINGS: Moderate right pleural effusion with right lung base atelectasis or infiltrate. Underlying mass is not excluded clinical correlation and follow-up to resolution recommended. The left lung is clear. No pneumothorax. Stable cardiac silhouette. Atherosclerotic calcification of the aorta. Osteopenia with degenerative changes of the spine. Multiple old healed right posterior rib fractures. Several old right posterior rib fractures with incomplete healing. There is apparent acute fracture of the lateral right eighth rib. IMPRESSION: 1. Probable acute fracture of the lateral right eighth rib. No pneumothorax. 2. Moderate right pleural effusion with right lung base atelectasis or infiltrate. Electronically Signed   By: Anner Crete M.D.   On: 07/02/2020 17:27   CT Head Wo Contrast  Result Date: 07/02/2020 CLINICAL DATA:  87 year old male status post fall EXAM: CT HEAD WITHOUT CONTRAST CT CERVICAL SPINE WITHOUT CONTRAST TECHNIQUE: Multidetector CT imaging of the head and cervical spine was performed following the standard protocol without intravenous contrast. Multiplanar CT image reconstructions of the cervical spine were also generated. COMPARISON:  None. FINDINGS: CT HEAD FINDINGS Brain: No evidence of acute infarction, hemorrhage, hydrocephalus, extra-axial collection or mass lesion/mass effect. Very mild age related involutional changes including cerebral and cortical volume loss and minimal (for age) chronic microvascular ischemic white matter disease. Vascular: No hyperdense vessel or unexpected calcification. Skull: Normal. Negative for fracture or focal lesion. Sinuses/Orbits: No acute  finding. Other: Small right frontal scalp hematoma with associated laceration. CT CERVICAL SPINE FINDINGS Alignment: Normal. Skull base and vertebrae: No acute fracture or malalignment. Soft tissues and spinal canal: No prevertebral fluid or swelling. No visible canal hematoma. Disc levels: Multilevel cervical spondylosis. Degenerative anterolisthesis of C2 on C3 measures approximately 4 mm. Multilevel posterior disc osteophyte complexes likely resulting in central stenosis. The level of most severe stenosis include C3-C4 and C6-C7. Upper chest: Complete opacification of the right lung apex. Suspect large effusion. Other: None. IMPRESSION: CT HEAD 1. No acute intracranial abnormality. 2. Right frontal scalp hematoma and laceration without evidence of underlying calvarial fracture. 3. Minimal age related involutional changes. CT CSPINE 1. No acute fracture or malalignment. 2. Multilevel cervical spondylosis with significant central canal stenosis at C3-C4 and C6-C7. 3. Degenerative anterolisthesis of C2 on C3. 4. Complete opacification of the right lung apex, suspect large pleural effusion. Electronically Signed   By: Jacqulynn Cadet M.D.   On: 07/02/2020 17:14   CT Cervical Spine Wo Contrast  Result Date: 07/02/2020 CLINICAL DATA:  87 year old male status post fall EXAM: CT HEAD WITHOUT CONTRAST CT CERVICAL SPINE WITHOUT CONTRAST TECHNIQUE: Multidetector CT imaging of the head and cervical spine was performed following the standard protocol without intravenous contrast. Multiplanar CT image reconstructions of the cervical spine were also generated. COMPARISON:  None. FINDINGS: CT HEAD FINDINGS Brain: No evidence of acute infarction, hemorrhage, hydrocephalus, extra-axial collection or mass lesion/mass effect. Very mild age related involutional changes including cerebral and cortical volume loss and minimal (for age) chronic microvascular ischemic white matter disease. Vascular: No hyperdense vessel or  unexpected calcification. Skull: Normal. Negative for fracture or focal lesion. Sinuses/Orbits: No acute finding. Other: Small right frontal scalp hematoma  with associated laceration. CT CERVICAL SPINE FINDINGS Alignment: Normal. Skull base and vertebrae: No acute fracture or malalignment. Soft tissues and spinal canal: No prevertebral fluid or swelling. No visible canal hematoma. Disc levels: Multilevel cervical spondylosis. Degenerative anterolisthesis of C2 on C3 measures approximately 4 mm. Multilevel posterior disc osteophyte complexes likely resulting in central stenosis. The level of most severe stenosis include C3-C4 and C6-C7. Upper chest: Complete opacification of the right lung apex. Suspect large effusion. Other: None. IMPRESSION: CT HEAD 1. No acute intracranial abnormality. 2. Right frontal scalp hematoma and laceration without evidence of underlying calvarial fracture. 3. Minimal age related involutional changes. CT CSPINE 1. No acute fracture or malalignment. 2. Multilevel cervical spondylosis with significant central canal stenosis at C3-C4 and C6-C7. 3. Degenerative anterolisthesis of C2 on C3. 4. Complete opacification of the right lung apex, suspect large pleural effusion. Electronically Signed   By: Jacqulynn Cadet M.D.   On: 07/02/2020 17:14   DG Hand Complete Right  Result Date: 07/02/2020 CLINICAL DATA:  Deformity of RIGHT fourth and fifth finger. EXAM: RIGHT HAND - COMPLETE 3+ VIEW COMPARISON:  None FINDINGS: Dislocation of the interphalangeal joint between proximal and middle phalanx of the small finger of the RIGHT hand with ulnar displacement and dorsal displacement of the distal finger. There is over riding of the displaced middle phalanx extending approximately 6 mm proximal along the dorsal aspect of the proximal phalanx. Deformity of the RIGHT first digit/thumb with what is likely chronic deformity though could represent injury to the interphalangeal joint due to hyper extension  at this joint. No visible fracture in this location though surrounding degenerative changes makes assessment difficult. No visible fracture. Extensive arthritic changes throughout the hand involving first [place], first metacarpal phalangeal and all interphalangeal joints. No radiopaque foreign body in the soft tissues. IMPRESSION: Dislocation of the interphalangeal joint of the small finger/fifth digit of the RIGHT hand as described. Suspect chronic deformity of the RIGHT thumb due to arthritic changes. Correlate with any pain in this area with dedicated radiography as warranted. Electronically Signed   By: Zetta Bills M.D.   On: 07/02/2020 17:33    EKG: Independently reviewed.  Chronic first-degree AV block  Assessment/Plan Active Problems:   Pleural effusion  (please populate well all problems here in Problem List. (For example, if patient is on BP meds at home and you resume or decide to hold them, it is a problem that needs to be her. Same for CAD, COPD, HLD and so on)  Near syncope and fall -Suspect over stringent BP control.  As patient reported that his blood pressure medication has been managed by PCP to address AAA, regimen including HCTZ, ACEI and beta-blocker, however with this combination he occasionally feels lightheaded and had several episodes of fall in the past. -We will hold off HCTZ and ACEI, leave metoprolol regimen -Orthostatic vital signs  Large right-sided pleural effusion -Strongly suspect this is malignant pleural effusion.  Long discussion with patient and his grandson at bedside regarding the possible work-ups.  I propose to high-yield work-up will be thoracentesis by IR, assess address both etiology as well as treatment to relieve symptoms.  If pleural effusion turn to be malignant, may need repeated thoracentesis versus Pleurdx.  Overall prognosis poor, may need to discuss about hospice in the near future. -Hold off echocardiogram, CT chest.  Positive troponins -No  chest pain, will send the floor side troponin -Repeat EKG -Conservative management given patient's age.  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

## 2020-07-02 NOTE — ED Notes (Signed)
Covid Swab redone and sent to lab

## 2020-07-02 NOTE — ED Provider Notes (Signed)
[facility] Emergency Department Provider Note  ____________________________________________   Event Date/Time   First MD Initiated Contact with Patient 07/02/20 1637     (approximate)  I have reviewed the triage vital signs and the nursing notes.   HISTORY  Chief Complaint Fall  HPI Tyler Pena is a 87 y.o. male who presents to the emergency department for evaluation following a fall at a nursing facility.  The patient states that he was using his cane, outside on the sidewalk and somehow fell.  He states he did not trip, does not think that he simply lost his balance but is unsure why he fell.  He fell onto the right side of his body, striking his right side of his rib cage, right side of his head and injuring his right hand.  He denies any dizziness, denies any loss of consciousness subsequent to hitting his head.  He does not report any significant illness recently, however he has been on ciprofloxacin for the last 3 days for treatment of a UTI.  He states that his symptoms have been significantly improved since starting the Cipro but that his course of the antibiotic is not yet finished.  He initially denies any chest pain, shortness of breath, cough.  After discovery of some exam and test findings he is asked about this again to which he reports that now that he thinks about it, he has had shortness of breath and cough when trying to lay down at night due to feeling like he is gasping for air if he lies completely flat.  He does report a lot of medical history, most significant for heart attack with 2 stent placements, multiple abdominal surgeries with a large ventral hernia, 2 inoperable aortic aneurysms, bladder cancer treated in 2018 and large basal cell carcinoma removed in July 2021.  He denies any previous pulmonary complaints.  In terms of current medical management, he only takes prescription medicine for blood pressure (metoprolol ER 25 mg), finasteride  and tamsulosin.       Past Medical History:  Diagnosis Date  . Arthritis   . Bilateral renal cysts   . Bladder cancer [facility])    urologist-  dr Karsten Ro  . BPH (benign prostatic hyperplasia)   . Coronary artery disease    hx PCI w/ stenting in 10/ 2014 in [place]--- recently moved from [place] jan 2018 has not established a cardiologist yet but does have pcp   . Dyslipidemia   . Essential hypertension   . GERD (gastroesophageal reflux disease)   . Hematuria   . History of gastric ulcer   . History of hiatal hernia   . History of kidney stones   . History of melanoma in situ    several Excision's in situ and malignant melanoma's--  last one 06/ 2017 top of ear excision Stage 1 w/ negative margins/  other have been right upper arm, shoulder, chest, face  . History of MI (myocardial infarction)    10/ 2014-- s/p  PCI and stenting x2  . History of small bowel obstruction    fall 2017  s/p  abdominal lysis adhesions  (prior hx bowel obstruction total 8 times)  . Nocturia   . S/P right coronary artery (RCA) stent placement    10/ 2014  x2  . Type 2 diabetes, diet controlled ([place])   . Wears glasses   . Wears hearing aid    bilateral    Patient Active Problem List  Diagnosis Date Noted  . Pleural effusion 07/02/2020  . 1st degree AV block 09/17/2018  . Diabetes mellitus type 2, diet-controlled ([place]) 11/28/2016  . SBO (small bowel obstruction) ([place]) 11/07/2016  . Partial small bowel obstruction ([place]) 10/28/2016  . Carcinoma ([place]) 10/12/2016  . Chest pain, rule out acute myocardial infarction 10/07/2016  . Bladder tumor 09/02/2016  . Benign prostatic hyperplasia with weak urinary stream 08/18/2016  . Coronary artery disease involving native coronary artery of native heart without angina pectoris 08/18/2016  . Essential hypertension 08/18/2016  . GERD without esophagitis 08/18/2016    Past Surgical History:  Procedure Laterality Date  . ABDOMINAL HERNIA REPAIR  2012  .  ADDOMINAL LYSIS ADHESIONS  fall 2017   sbo  . CHOLECYSTECTOMY OPEN  1971   and Appendectomy  . CORONARY ANGIOPLASTY WITH STENT PLACEMENT  04-17-2013  in [place]   x2 stents to RCA  . INGUINAL HERNIA REPAIR Bilateral 1995  . TOTAL HIP ARTHROPLASTY Left 2008  . TRANSURETHRAL RESECTION OF BLADDER TUMOR  09/2006   LEIOMYOMA   . TRANSURETHRAL RESECTION OF BLADDER TUMOR WITH MITOMYCIN-C N/A 09/02/2016   Procedure: TRANSURETHRAL RESECTION OF BLADDER TUMOR WITH MITOMYCIN-C;  Surgeon: Kathie Rhodes, MD;  Location: [facility];  Service: Urology;  Laterality: N/A;    Prior to Admission medications   Medication Sig Start Date End Date Taking? Authorizing Provider  acetaminophen (TYLENOL) 500 MG tablet Take by mouth.    [provider]  aspirin EC 81 MG tablet Take 81 mg by mouth daily.    [provider]  Black Pepper-Turmeric 3-500 MG CAPS Take 2 capsules by mouth daily.    [provider]  Chromium-Cinnamon (CINNAMON PLUS CHROMIUM PO) Take 1 tablet by mouth 2 (two) times daily. 1000mg  cinnamon/  250mg  chromium    [provider]  finasteride (PROSCAR) 5 MG tablet Take 5 mg by mouth every evening.     [provider]  hydrochlorothiazide (HYDRODIURIL) 25 MG tablet Take 12.5 mg by mouth every morning.     [provider]  lisinopril (ZESTRIL) 5 MG tablet Take 5 mg by mouth daily. 10/15/18   [provider]  losartan (COZAAR) 50 MG tablet Take 50 mg by mouth every evening.     [provider]  meloxicam (MOBIC) 15 MG tablet TAKE 1 TABLET (15 MG TOTAL) BY MOUTH DAILY WITH BREAKFAST 05/14/18   [provider]  metoprolol succinate (TOPROL-XL) 25 MG 24 hr tablet Take by mouth. 09/17/18 09/17/19  [provider]  metoprolol tartrate (LOPRESSOR) 25 MG tablet Take 50 mg by mouth every evening.     [provider]  Multiple Vitamin (MULTIVITAMIN WITH MINERALS) TABS tablet Take 1 tablet by mouth  daily.    [provider]  mupirocin cream (BACTROBAN) 2 % Apply to skin during each bandage change 10/02/19   Ralene Bathe, MD  Omega-3 Fatty Acids (SUPER OMEGA 3 EPA/DHA) 1000 MG CAPS Take 1 capsule by mouth daily. Omega 3 - DHA - EPA - Fish oil-- 1000mg / 120mg / 180mg     [provider]  omeprazole (PRILOSEC) 20 MG capsule Take by mouth.    [provider]  pantoprazole (PROTONIX) 40 MG tablet Take 40 mg by mouth every evening.     [provider]  polyethylene glycol powder (GLYCOLAX/MIRALAX) 17 GM/SCOOP powder Take by mouth.    [provider]  tamsulosin (FLOMAX) 0.4 MG CAPS capsule Take 0.4 mg by mouth every evening.  [provider]    Allergies Fenofibrate and Losartan  Family History  Problem Relation Age of Onset  . Cancer Mother   . CAD Father   . CAD Sister   . CAD Sister   . CAD Sister     Social History Social History   Tobacco Use  . Smoking status: Never Smoker  . Smokeless tobacco: Never Used  Vaping Use  . Vaping Use: Never used  Substance Use Topics  . Alcohol use: Yes    Comment: occasional  . Drug use: No    Review of Systems Constitutional: No fever/chills Eyes: No visual changes. ENT: No sore throat. Cardiovascular: + Right-sided rib pain, denies chest pain. Respiratory: + Cough, + shortness of breath. Gastrointestinal: No abdominal pain.  No nausea, no vomiting.  No diarrhea.  No constipation. Genitourinary: Negative for dysuria. Musculoskeletal: + Right hand pain, negative for back pain. Skin: Negative for rash. Neurological: Negative for headaches, focal weakness or numbness.  ____________________________________________   PHYSICAL EXAM:  VITAL SIGNS: ED Triage Vitals  Enc Vitals Group     BP 07/02/20 1626 123/68     Pulse Rate 07/02/20 1624 74     Resp 07/02/20 1626 18     Temp 07/02/20 1636 98.4 F (36.9 C)     Temp Source 07/02/20 1636 Oral     SpO2 07/02/20 1624 97 %      Weight 07/02/20 1625 210 lb (95.3 kg)     Height 07/02/20 1625 6' (1.829 m)     Head Circumference --      Peak Flow --      Pain Score 07/02/20 1624 6     Pain Loc --      Pain Edu? --      Excl. in [place]? --    Constitutional: Alert and oriented.  Quite well appearing for age and in no acute distress. Eyes: Conjunctivae are normal. PERRL. EOMI. Head: Frontal hematoma with less than 1 cm laceration, otherwise atraumatic Nose: No congestion/rhinnorhea. Mouth/Throat: Mucous membranes are moist.  Oropharynx non-erythematous. Neck: No stridor.  No tenderness to the midline of the cervical spine, no tenderness to the paraspinal musculature.  Full range of motion. Cardiovascular: There is tenderness to palpation of the right side of the chest wall with no ecchymosis or obvious deformity.  Normal rate, regular rhythm. Grossly normal heart sounds.  Good peripheral circulation. Respiratory: Normal respiratory effort.  No retractions. Lungs with significantly decreased breath sounds to the right mid and lower lung, no wheezing, crackles or rhonchi appreciated in any lung field. Gastrointestinal: Large ventral wall hernia noted.  Soft and nontender. No distention. No abdominal bruits. No CVA tenderness. Musculoskeletal: There is obvious deformity of the right fifth digit.  There is no available range of motion of the PIP of the fifth digit.  There is surrounding soft tissue swelling and ecchymosis of the palmar surface of the PIP.  There is a very small abrasion to the medial surface of the PIP that appears superficial.  No lower extremity tenderness nor edema.  Negative logroll, no obvious deformities.  No joint effusions. Neurologic:  Normal speech and language. No gross focal neurologic deficits are appreciated. No gait instability. Skin:  Skin is warm, dry and intact except as described above. No rash noted. Psychiatric: Mood and affect are normal. Speech and behavior are  normal.  ____________________________________________   LABS (all labs ordered are listed, but only abnormal results are displayed)  Labs Reviewed  COMPREHENSIVE METABOLIC PANEL -  Abnormal; Notable for the following components:      Result Value   Sodium 133 (*)    Chloride 92 (*)    Glucose, Bld 157 (*)    Calcium 8.7 (*)    All other components within normal limits  CBC WITH DIFFERENTIAL/PLATELET - Abnormal; Notable for the following components:   WBC 12.5 (*)    RBC 4.09 (*)    Hemoglobin 12.0 (*)    HCT 36.3 (*)    Neutro Abs 9.7 (*)    All other components within normal limits  URINALYSIS, COMPLETE (UACMP) WITH MICROSCOPIC - Abnormal; Notable for the following components:   Color, Urine YELLOW (*)    APPearance HAZY (*)    Ketones, ur 5 (*)    Leukocytes,Ua LARGE (*)    Bacteria, UA RARE (*)    All other components within normal limits  TROPONIN I (HIGH SENSITIVITY) - Abnormal; Notable for the following components:   Troponin I (High Sensitivity) 130 (*)    All other components within normal limits  RESP PANEL BY RT-PCR (FLU A&B, COVID) ARPGX2  URINE CULTURE  PROCALCITONIN  BRAIN NATRIURETIC PEPTIDE  PROTIME-INR  PROCALCITONIN  CBC  TROPONIN I (HIGH SENSITIVITY)  TROPONIN I (HIGH SENSITIVITY)   ____________________________________________  EKG  Normal sinus rhythm with a rate of 75 bpm with evidence of first-degree AV block.  QTc interval 457 ms.   ____________________________________________  RADIOLOGY Lenoria Farrier, personally viewed and evaluated these images (plain radiographs) as part of my medical decision making, as well as reviewing the written report by the radiologist.  ED provider interpretation: Evidence of acute right rib fracture with moderate to large right-sided pleural effusion.  No definite infiltrate identified.  X-rays of right hand demonstrate a dislocated PIP of the right fifth digit with no evidence of fracture.  Official  radiology report(s): DG Ribs Unilateral W/Chest Right  Result Date: 07/02/2020 CLINICAL DATA:  87 year old male with right chest wall pain. EXAM: RIGHT RIBS AND CHEST - 3+ VIEW COMPARISON:  Chest radiograph dated 10/07/2016. FINDINGS: Moderate right pleural effusion with right lung base atelectasis or infiltrate. Underlying mass is not excluded clinical correlation and follow-up to resolution recommended. The left lung is clear. No pneumothorax. Stable cardiac silhouette. Atherosclerotic calcification of the aorta. Osteopenia with degenerative changes of the spine. Multiple old healed right posterior rib fractures. Several old right posterior rib fractures with incomplete healing. There is apparent acute fracture of the lateral right eighth rib. IMPRESSION: 1. Probable acute fracture of the lateral right eighth rib. No pneumothorax. 2. Moderate right pleural effusion with right lung base atelectasis or infiltrate. Electronically Signed   By: Anner Crete M.D.   On: 07/02/2020 17:27   CT Head Wo Contrast  Result Date: 07/02/2020 CLINICAL DATA:  87 year old male status post fall EXAM: CT HEAD WITHOUT CONTRAST CT CERVICAL SPINE WITHOUT CONTRAST TECHNIQUE: Multidetector CT imaging of the head and cervical spine was performed following the standard protocol without intravenous contrast. Multiplanar CT image reconstructions of the cervical spine were also generated. COMPARISON:  None. FINDINGS: CT HEAD FINDINGS Brain: No evidence of acute infarction, hemorrhage, hydrocephalus, extra-axial collection or mass lesion/mass effect. Very mild age related involutional changes including cerebral and cortical volume loss and minimal (for age) chronic microvascular ischemic white matter disease. Vascular: No hyperdense vessel or unexpected calcification. Skull: Normal. Negative for fracture or focal lesion. Sinuses/Orbits: No acute finding. Other: Small right frontal scalp hematoma with associated laceration. CT CERVICAL  SPINE FINDINGS Alignment: Normal.  Skull base and vertebrae: No acute fracture or malalignment. Soft tissues and spinal canal: No prevertebral fluid or swelling. No visible canal hematoma. Disc levels: Multilevel cervical spondylosis. Degenerative anterolisthesis of C2 on C3 measures approximately 4 mm. Multilevel posterior disc osteophyte complexes likely resulting in central stenosis. The level of most severe stenosis include C3-C4 and C6-C7. Upper chest: Complete opacification of the right lung apex. Suspect large effusion. Other: None. IMPRESSION: CT HEAD 1. No acute intracranial abnormality. 2. Right frontal scalp hematoma and laceration without evidence of underlying calvarial fracture. 3. Minimal age related involutional changes. CT CSPINE 1. No acute fracture or malalignment. 2. Multilevel cervical spondylosis with significant central canal stenosis at C3-C4 and C6-C7. 3. Degenerative anterolisthesis of C2 on C3. 4. Complete opacification of the right lung apex, suspect large pleural effusion. Electronically Signed   By: Jacqulynn Cadet M.D.   On: 07/02/2020 17:14   CT Cervical Spine Wo Contrast  Result Date: 07/02/2020 CLINICAL DATA:  87 year old male status post fall EXAM: CT HEAD WITHOUT CONTRAST CT CERVICAL SPINE WITHOUT CONTRAST TECHNIQUE: Multidetector CT imaging of the head and cervical spine was performed following the standard protocol without intravenous contrast. Multiplanar CT image reconstructions of the cervical spine were also generated. COMPARISON:  None. FINDINGS: CT HEAD FINDINGS Brain: No evidence of acute infarction, hemorrhage, hydrocephalus, extra-axial collection or mass lesion/mass effect. Very mild age related involutional changes including cerebral and cortical volume loss and minimal (for age) chronic microvascular ischemic white matter disease. Vascular: No hyperdense vessel or unexpected calcification. Skull: Normal. Negative for fracture or focal lesion. Sinuses/Orbits: No  acute finding. Other: Small right frontal scalp hematoma with associated laceration. CT CERVICAL SPINE FINDINGS Alignment: Normal. Skull base and vertebrae: No acute fracture or malalignment. Soft tissues and spinal canal: No prevertebral fluid or swelling. No visible canal hematoma. Disc levels: Multilevel cervical spondylosis. Degenerative anterolisthesis of C2 on C3 measures approximately 4 mm. Multilevel posterior disc osteophyte complexes likely resulting in central stenosis. The level of most severe stenosis include C3-C4 and C6-C7. Upper chest: Complete opacification of the right lung apex. Suspect large effusion. Other: None. IMPRESSION: CT HEAD 1. No acute intracranial abnormality. 2. Right frontal scalp hematoma and laceration without evidence of underlying calvarial fracture. 3. Minimal age related involutional changes. CT CSPINE 1. No acute fracture or malalignment. 2. Multilevel cervical spondylosis with significant central canal stenosis at C3-C4 and C6-C7. 3. Degenerative anterolisthesis of C2 on C3. 4. Complete opacification of the right lung apex, suspect large pleural effusion. Electronically Signed   By: Jacqulynn Cadet M.D.   On: 07/02/2020 17:14   DG Hand Complete Right  Result Date: 07/02/2020 CLINICAL DATA:  Deformity of RIGHT fourth and fifth finger. EXAM: RIGHT HAND - COMPLETE 3+ VIEW COMPARISON:  None FINDINGS: Dislocation of the interphalangeal joint between proximal and middle phalanx of the small finger of the RIGHT hand with ulnar displacement and dorsal displacement of the distal finger. There is over riding of the displaced middle phalanx extending approximately 6 mm proximal along the dorsal aspect of the proximal phalanx. Deformity of the RIGHT first digit/thumb with what is likely chronic deformity though could represent injury to the interphalangeal joint due to hyper extension at this joint. No visible fracture in this location though surrounding degenerative changes makes  assessment difficult. No visible fracture. Extensive arthritic changes throughout the hand involving first [place], first metacarpal phalangeal and all interphalangeal joints. No radiopaque foreign body in the soft tissues. IMPRESSION: Dislocation of the interphalangeal joint of the  small finger/fifth digit of the RIGHT hand as described. Suspect chronic deformity of the RIGHT thumb due to arthritic changes. Correlate with any pain in this area with dedicated radiography as warranted. Electronically Signed   By: Zetta Bills M.D.   On: 07/02/2020 17:33    ____________________________________________   PROCEDURES  Procedure(s) performed (including Critical Care):  Marland KitchenMarland KitchenLaceration Repair  Date/Time: 07/02/2020 10:59 PM Performed by: Marlana Salvage, PA Authorized by: Marlana Salvage, PA   Consent:    Consent obtained:  Verbal   Consent given by:  Patient   Risks, benefits, and alternatives were discussed: yes     Risks discussed:  Infection, need for additional repair and pain   Alternatives discussed:  No treatment Universal protocol:    Procedure explained and questions answered to patient or proxy's satisfaction: yes     Patient identity confirmed:  Verbally with patient Anesthesia:    Anesthesia method:  Topical application   Topical anesthetic:  LET Laceration details:    Location:  Face   Face location:  Forehead   Length (cm):  1 Exploration:    Hemostasis achieved with:  LET   Imaging outcome: foreign body not noted   Treatment:    Area cleansed with:  Povidone-iodine   Amount of cleaning:  Standard   Irrigation method:  Tap Skin repair:    Repair method:  Sutures   Suture size:  5-0   Suture material:  Nylon   Suture technique:  Simple interrupted   Number of sutures:  1 Approximation:    Approximation:  Close Repair type:    Repair type:  Simple Post-procedure details:    Dressing:  Open (no dressing)   Procedure completion:  Tolerated well, no immediate  complications .Ortho Injury Treatment  Date/Time: 07/02/2020 11:01 PM Performed by: Marlana Salvage, PA Authorized by: Marlana Salvage, PA   Consent:    Consent obtained:  Verbal   Consent given by:  Patient   Risks discussed:  Irreducible dislocation, recurrent dislocation and fracture   Alternatives discussed:  No treatment and referralInjury location: finger Location details: right little finger Injury type: dislocation Dislocation type: PIP Pre-procedure neurovascular assessment: neurovascularly intact Pre-procedure distal perfusion: normal Pre-procedure neurological function: normal Pre-procedure range of motion: reduced Anesthesia: digital block  Anesthesia: Local anesthesia used: yes Local Anesthetic: lidocaine 1% without epinephrine Anesthetic total: 5 mL  Patient sedated: NoManipulation performed: yes Reduction successful: no Post-procedure distal perfusion: normal Post-procedure neurological function: normal Post-procedure range of motion: unchanged Patient tolerance: patient tolerated the procedure well with no immediate complications Comments: PIP reduction attempted by both myself and Dr. Archie Balboa both with and without a nerve block in place without success.      ____________________________________________   INITIAL IMPRESSION / ASSESSMENT AND PLAN / ED COURSE  As part of my medical decision making, I reviewed the following data within the [facility] notes reviewed and incorporated, Labs reviewed, Radiograph reviewed, Discussed with admitting physician Dr. Roosevelt Locks, A consult was requested and obtained from this/these consultant(s) Orthopedics and Evaluated by EM attending Dr. Archie Balboa        Patient is a 87 year old male who presents to the emergency department after a fall at a nursing facility.  He adamantly denies any mechanical cause for his fall, though he does not report any prodrome of dizziness, chest pain, shortness of  breath prior to his fall.  During the fall, he injured the right side of his ribs, right hand and struck  his head.  See HPI for further details.  In triage, the patient has normal vital signs.  On physical exam, the patient has a large forehead hematoma with small laceration present, but no neurologic deficits appreciated.  He has an obvious deformity with dislocation of the right fifth digit with decreased range of motion but is neurovascularly intact.  He also has significantly decreased breath sounds to the right lower lung with no other adventitious sounds heard throughout the rest of the lungs.    Evaluation in the ER began with CT of the head and neck, chest x-ray with view of the right ribs, x-ray of the hand, and EKG.  CT of the head and neck are negative for acute intracranial or cervical pathology.  Hematoma and laceration are redemonstrated on CT as evidenced on physical exam.  Chest x-ray demonstrates a right sided rib fracture with moderate to large right-sided pleural effusion.  X-ray of the right hand demonstrates dislocation of the PIP of the right fifth digit with no evidence of acute fracture.  Given the findings on chest x-ray specifically, further laboratory evaluation was performed.  A CBC, CMP, procalcitonin, troponin, BNP, urinalysis, urine culture, respiratory panel were obtained.  CBC demonstrates mild leukocytosis and very mild anemia at 12.0.  There is a mild hyponatremia of 133 with no other significant findings throughout the CBC and CMP.  BNP is normal at 94.4, procalcitonin less than 0.10.  Initial troponin come back elevated at 130.  In regards to the patient's laceration, this was repaired with 1 single suture in the face.  Reduction of the right fifth digit was attempted multiple times by myself and Dr. Archie Balboa and were unable to relocate it.  Dr. Harlow Mares was consulted.  Given that the patient will likely need admission for other cause, Dr. Harlow Mares will consult on the patient  on the floor.    At this point in time, the patient has multiple medical conditions requiring intervention including dislocation of the right fifth digit PIP, new onset pleural effusion that was not present in reviewed imaging of May 2020, and elevated troponin.  After repeat questioning the patient, he does say that he has been short of breath with lying down and feeling like he is gasping for air.  Will consult hospitalist medicine for admission to further evaluate this pleural effusion.  Patient is amenable with this plan.  Dr. Roosevelt Locks was consulted and agrees to admit the patient to the hospital.  Patient is stable at this time for transfer to the floor and will receive further work-up at that time.     ____________________________________________   FINAL CLINICAL IMPRESSION(S) / ED DIAGNOSES  Final diagnoses:  Pleural effusion  Dislocation of proximal interphalangeal joint of finger, initial encounter  Acute cystitis without hematuria  Facial laceration, initial encounter  Fall, initial encounter     ED Discharge Orders    None      *Please note:  Alen Suite was evaluated in Emergency Department on 07/02/2020 for the symptoms described in the history of present illness. He was evaluated in the context of the global COVID-19 pandemic, which necessitated consideration that the patient might be at risk for infection with the SARS-CoV-2 virus that causes COVID-19. Institutional protocols and algorithms that pertain to the evaluation of patients at risk for COVID-19 are in a state of rapid change based on information released by regulatory bodies including the CDC and federal and state organizations. These policies and algorithms were followed during the patient's care  in the ED.  Some ED evaluations and interventions may be delayed as a result of limited staffing during and the pandemic.*   Note:  This document was prepared using Dragon voice recognition software and may include  unintentional dictation errors.    Lucy Chris, PA 07/02/20 2318    Phineas Semen, MD 07/03/20 1929

## 2020-07-02 NOTE — Progress Notes (Addendum)
PHARMACIST - PHYSICIAN ORDER COMMUNICATION  CONCERNING: P&T Medication Policy on Herbal Medications  DESCRIPTION:  This patient's orders for:  Black Pepper/Tumeric & Chromium/Cinnamon has been noted.  This product(s) is classified as an "herbal" or natural product. Due to a lack of definitive safety studies or FDA approval, nonstandard manufacturing practices, plus the potential risk of unknown drug-drug interactions while on inpatient medications, the Pharmacy and Therapeutics Committee does not permit the use of "herbal" or natural products of this type within [facility].   ACTION TAKEN: The pharmacy department is unable to verify this order at this time. Please reevaluate patient's clinical condition at discharge and address if the herbal or natural product(s) should be resumed at that time.  Otelia Sergeant, PharmD, [facility] 07/02/2020 10:27 PM

## 2020-07-02 NOTE — Progress Notes (Signed)
PHARMACIST - PHYSICIAN COMMUNICATION  CONCERNING:  Enoxaparin (Lovenox) for DVT Prophylaxis    RECOMMENDATION: Patient was prescribed enoxaprin 40mg  q24 hours for VTE prophylaxis.   Filed Weights   07/02/20 1625  Weight: 95.3 kg (210 lb)    Body mass index is 28.48 kg/m.  Estimated Creatinine Clearance: 58.8 mL/min (by C-G formula based on SCr of 0.86 mg/dL).   Based on [facility] policy patient is candidate for enoxaparin 0.5mg /kg TBW SQ every 24 hours based on BMI being >30.   DESCRIPTION: Pharmacy has adjusted enoxaparin dose per [facility] policy.  Patient is now receiving enoxaparin 40 mg every 12 hours   [facility], PharmD, [facility] 07/02/2020 10:31 PM

## 2020-07-02 NOTE — ED Triage Notes (Signed)
Pt to ED via EMS from [facility] for chief complaint of fall while out getting ice cream  Reports tripping when stepping off the curb.  Hit head, bleeding controlled at this time, denies blood thinner use. Denies LOC  Obvious deformity to right pinky. C/o pain to right ribs.   Pt alert and oriented with clear speech.

## 2020-07-02 NOTE — ED Triage Notes (Signed)
Pt comes into the ED via EMS from [facility], states he stepped of the curb and his knee gave out and fell forward hitting his her, pt has a hematoma and has deformity to the right 4-5th finger and has some mild pain to the right ribs. Denies LOC, not on any blood thinners, pt is a/ox4.  157/87 80 98%RA

## 2020-07-03 ENCOUNTER — Inpatient Hospital Stay: Payer: Medicare Other

## 2020-07-03 ENCOUNTER — Encounter: Payer: Self-pay | Admitting: Internal Medicine

## 2020-07-03 ENCOUNTER — Other Ambulatory Visit: Payer: Self-pay

## 2020-07-03 DIAGNOSIS — J90 Pleural effusion, not elsewhere classified: Secondary | ICD-10-CM | POA: Diagnosis not present

## 2020-07-03 LAB — CBC
HCT: 38.3 % — ABNORMAL LOW (ref 39.0–52.0)
Hemoglobin: 13.1 g/dL (ref 13.0–17.0)
MCH: 29.8 pg (ref 26.0–34.0)
MCHC: 34.2 g/dL (ref 30.0–36.0)
MCV: 87.2 fL (ref 80.0–100.0)
Platelets: 265 10*3/uL (ref 150–400)
RBC: 4.39 MIL/uL (ref 4.22–5.81)
RDW: 13.8 % (ref 11.5–15.5)
WBC: 10.9 10*3/uL — ABNORMAL HIGH (ref 4.0–10.5)
nRBC: 0 % (ref 0.0–0.2)

## 2020-07-03 LAB — LACTATE DEHYDROGENASE, PLEURAL OR PERITONEAL FLUID: LD, Fluid: 83 U/L — ABNORMAL HIGH (ref 3–23)

## 2020-07-03 LAB — PROCALCITONIN: Procalcitonin: <0.1 ng/mL

## 2020-07-03 LAB — BODY FLUID CELL COUNT WITH DIFFERENTIAL
Eos, Fluid: 1 %
Lymphs, Fluid: 57 %
Monocyte-Macrophage-Serous Fluid: 29 %
Neutrophil Count, Fluid: 13 %
Total Nucleated Cell Count, Fluid: 870 cu mm

## 2020-07-03 LAB — PROTEIN, PLEURAL OR PERITONEAL FLUID: Total protein, fluid: 5 g/dL

## 2020-07-03 LAB — LACTATE DEHYDROGENASE: LDH: 106 U/L (ref 98–192)

## 2020-07-03 LAB — TROPONIN I (HIGH SENSITIVITY): Troponin I (High Sensitivity): 9 ng/L (ref <18)

## 2020-07-03 MED ORDER — IOHEXOL 300 MG/ML  SOLN
75.0000 mL | Freq: Once | INTRAMUSCULAR | Status: AC | PRN
  Administered 2020-07-03: 75 mL via INTRAVENOUS

## 2020-07-03 NOTE — Consult Note (Signed)
ORTHOPAEDIC CONSULTATION  REQUESTING PHYSICIAN: Edwin Dada, *  Chief Complaint: right hand pain  HPI: Tyler Pena is a 87 y.o. male who presents to the emergency department for evaluation following a fall at a nursing facility.  The patient states that he was using his cane, outside on the sidewalk and somehow fell.  He states he did not trip, does not think that he simply lost his balance but is unsure why he fell.  He fell onto the right side of his body, striking his right side of his rib cage, right side of his head and injuring his right hand. Please see H&P and ED notes for details. Denies any numbness, tingling or constitutional symptoms.  Past Medical History:  Diagnosis Date  . Arthritis   . Bilateral renal cysts   . Bladder cancer [facility])    urologist-  dr Karsten Ro  . BPH (benign prostatic hyperplasia)   . Coronary artery disease    hx PCI w/ stenting in 10/ 2014 in [place]--- recently moved from [place] jan 2018 has not established a cardiologist yet but does have pcp   . Dyslipidemia   . Essential hypertension   . GERD (gastroesophageal reflux disease)   . Hematuria   . History of gastric ulcer   . History of hiatal hernia   . History of kidney stones   . History of melanoma in situ    several Excision's in situ and malignant melanoma's--  last one 06/ 2017 top of ear excision Stage 1 w/ negative margins/  other have been right upper arm, shoulder, chest, face  . History of MI (myocardial infarction)    10/ 2014-- s/p  PCI and stenting x2  . History of small bowel obstruction    fall 2017  s/p  abdominal lysis adhesions  (prior hx bowel obstruction total 8 times)  . Nocturia   . S/P right coronary artery (RCA) stent placement    10/ 2014  x2  . Type 2 diabetes, diet controlled ([place])   . Wears glasses   . Wears hearing aid    bilateral   Past Surgical History:  Procedure Laterality Date  . ABDOMINAL HERNIA REPAIR  2012  . ADDOMINAL LYSIS  ADHESIONS  fall 2017   [facility]  . CHOLECYSTECTOMY OPEN  1971   and Appendectomy  . CORONARY ANGIOPLASTY WITH STENT PLACEMENT  04-17-2013  in [place]   x2 stents to RCA  . INGUINAL HERNIA REPAIR Bilateral 1995  . TOTAL HIP ARTHROPLASTY Left 2008  . TRANSURETHRAL RESECTION OF BLADDER TUMOR  09/2006   LEIOMYOMA   . TRANSURETHRAL RESECTION OF BLADDER TUMOR WITH MITOMYCIN-C N/A 09/02/2016   Procedure: TRANSURETHRAL RESECTION OF BLADDER TUMOR WITH MITOMYCIN-C;  Surgeon: Kathie Rhodes, MD;  Location[facility];  Service: Urology;  Laterality: N/A;   Social History   Socioeconomic History  . Marital status: Married    Spouse name: Not on file  . Number of children: Not on file  . Years of education: Not on file  . Highest education level: Not on file  Occupational History  . Occupation: retired Engineer, drilling  Tobacco Use  . Smoking status: Never Smoker  . Smokeless tobacco: Never Used  Vaping Use  . Vaping Use: Never used  Substance and Sexual Activity  . Alcohol use: Yes    Comment: occasional  . Drug use: No  . Sexual activity: Not Currently  Other Topics Concern  . Not on file  Social History Narrative  .  Not on file   Social Determinants of Health   Financial Resource Strain: Not on file  Food Insecurity: Not on file  Transportation Needs: Not on file  Physical Activity: Not on file  Stress: Not on file  Social Connections: Not on file   Family History  Problem Relation Age of Onset  . Cancer Mother   . CAD Father   . CAD Sister   . CAD Sister   . CAD Sister    Allergies  Allergen Reactions  . Fenofibrate     Other reaction(s): Muscle Pain  . Losartan Other (See Comments)    Caused weakness, chest pressure, and shortness of breath   Prior to Admission medications   Medication Sig Start Date End Date Taking? Authorizing Provider  acetaminophen (TYLENOL) 500 MG tablet Take by mouth.    [provider]  aspirin EC 81 MG tablet Take 81 mg by  mouth daily.    [provider]  Black Pepper-Turmeric 3-500 MG CAPS Take 2 capsules by mouth daily.    [provider]  Chromium-Cinnamon (CINNAMON PLUS CHROMIUM PO) Take 1 tablet by mouth 2 (two) times daily. 1000mg  cinnamon/  250mg  chromium    [provider]  finasteride (PROSCAR) 5 MG tablet Take 5 mg by mouth every evening.     [provider]  hydrochlorothiazide (HYDRODIURIL) 25 MG tablet Take 12.5 mg by mouth every morning.     [provider]  lisinopril (ZESTRIL) 5 MG tablet Take 5 mg by mouth daily. 10/15/18   [provider]  losartan (COZAAR) 50 MG tablet Take 50 mg by mouth every evening.     [provider]  meloxicam (MOBIC) 15 MG tablet TAKE 1 TABLET (15 MG TOTAL) BY MOUTH DAILY WITH BREAKFAST 05/14/18   [provider]  metoprolol succinate (TOPROL-XL) 25 MG 24 hr tablet Take by mouth. 09/17/18 09/17/19  [provider]  metoprolol tartrate (LOPRESSOR) 25 MG tablet Take 50 mg by mouth every evening.     [provider]  Multiple Vitamin (MULTIVITAMIN WITH MINERALS) TABS tablet Take 1 tablet by mouth daily.    [provider]  mupirocin cream (BACTROBAN) 2 % Apply to skin during each bandage change 10/02/19   Ralene Bathe, MD  Omega-3 Fatty Acids (SUPER OMEGA 3 EPA/DHA) 1000 MG CAPS Take 1 capsule by mouth daily. Omega 3 - DHA - EPA - Fish oil-- 1000mg / 120mg / 180mg     [provider]  omeprazole (PRILOSEC) 20 MG capsule Take by mouth.    [provider]  pantoprazole (PROTONIX) 40 MG tablet Take 40 mg by mouth every evening.     [provider]  polyethylene glycol powder (GLYCOLAX/MIRALAX) 17 GM/SCOOP powder Take by mouth.    [provider]  tamsulosin (FLOMAX) 0.4 MG CAPS capsule Take 0.4 mg by mouth every evening.     [provider]   DG Ribs Unilateral W/Chest Right  Result Date: 07/02/2020 CLINICAL DATA:  87 year old male with  right chest wall pain. EXAM: RIGHT RIBS AND CHEST - 3+ VIEW COMPARISON:  Chest radiograph dated 10/07/2016. FINDINGS: Moderate right pleural effusion with right lung base atelectasis or infiltrate. Underlying mass is not excluded clinical correlation and follow-up to resolution recommended. The left lung is clear. No pneumothorax. Stable cardiac silhouette. Atherosclerotic calcification of the aorta. Osteopenia with degenerative changes of the spine. Multiple old healed right posterior rib fractures. Several old right posterior rib fractures with incomplete healing. There is apparent acute  fracture of the lateral right eighth rib. IMPRESSION: 1. Probable acute fracture of the lateral right eighth rib. No pneumothorax. 2. Moderate right pleural effusion with right lung base atelectasis or infiltrate. Electronically Signed   By: Anner Crete M.D.   On: 07/02/2020 17:27   CT Head Wo Contrast  Result Date: 07/02/2020 CLINICAL DATA:  87 year old male status post fall EXAM: CT HEAD WITHOUT CONTRAST CT CERVICAL SPINE WITHOUT CONTRAST TECHNIQUE: Multidetector CT imaging of the head and cervical spine was performed following the standard protocol without intravenous contrast. Multiplanar CT image reconstructions of the cervical spine were also generated. COMPARISON:  None. FINDINGS: CT HEAD FINDINGS Brain: No evidence of acute infarction, hemorrhage, hydrocephalus, extra-axial collection or mass lesion/mass effect. Very mild age related involutional changes including cerebral and cortical volume loss and minimal (for age) chronic microvascular ischemic white matter disease. Vascular: No hyperdense vessel or unexpected calcification. Skull: Normal. Negative for fracture or focal lesion. Sinuses/Orbits: No acute finding. Other: Small right frontal scalp hematoma with associated laceration. CT CERVICAL SPINE FINDINGS Alignment: Normal. Skull base and vertebrae: No acute fracture or malalignment. Soft tissues and spinal  canal: No prevertebral fluid or swelling. No visible canal hematoma. Disc levels: Multilevel cervical spondylosis. Degenerative anterolisthesis of C2 on C3 measures approximately 4 mm. Multilevel posterior disc osteophyte complexes likely resulting in central stenosis. The level of most severe stenosis include C3-C4 and C6-C7. Upper chest: Complete opacification of the right lung apex. Suspect large effusion. Other: None. IMPRESSION: CT HEAD 1. No acute intracranial abnormality. 2. Right frontal scalp hematoma and laceration without evidence of underlying calvarial fracture. 3. Minimal age related involutional changes. CT CSPINE 1. No acute fracture or malalignment. 2. Multilevel cervical spondylosis with significant central canal stenosis at C3-C4 and C6-C7. 3. Degenerative anterolisthesis of C2 on C3. 4. Complete opacification of the right lung apex, suspect large pleural effusion. Electronically Signed   By: Jacqulynn Cadet M.D.   On: 07/02/2020 17:14   CT Cervical Spine Wo Contrast  Result Date: 07/02/2020 CLINICAL DATA:  87 year old male status post fall EXAM: CT HEAD WITHOUT CONTRAST CT CERVICAL SPINE WITHOUT CONTRAST TECHNIQUE: Multidetector CT imaging of the head and cervical spine was performed following the standard protocol without intravenous contrast. Multiplanar CT image reconstructions of the cervical spine were also generated. COMPARISON:  None. FINDINGS: CT HEAD FINDINGS Brain: No evidence of acute infarction, hemorrhage, hydrocephalus, extra-axial collection or mass lesion/mass effect. Very mild age related involutional changes including cerebral and cortical volume loss and minimal (for age) chronic microvascular ischemic white matter disease. Vascular: No hyperdense vessel or unexpected calcification. Skull: Normal. Negative for fracture or focal lesion. Sinuses/Orbits: No acute finding. Other: Small right frontal scalp hematoma with associated laceration. CT CERVICAL SPINE FINDINGS  Alignment: Normal. Skull base and vertebrae: No acute fracture or malalignment. Soft tissues and spinal canal: No prevertebral fluid or swelling. No visible canal hematoma. Disc levels: Multilevel cervical spondylosis. Degenerative anterolisthesis of C2 on C3 measures approximately 4 mm. Multilevel posterior disc osteophyte complexes likely resulting in central stenosis. The level of most severe stenosis include C3-C4 and C6-C7. Upper chest: Complete opacification of the right lung apex. Suspect large effusion. Other: None. IMPRESSION: CT HEAD 1. No acute intracranial abnormality. 2. Right frontal scalp hematoma and laceration without evidence of underlying calvarial fracture. 3. Minimal age related involutional changes. CT CSPINE 1. No acute fracture or malalignment. 2. Multilevel cervical spondylosis with significant central canal stenosis at C3-C4 and C6-C7. 3. Degenerative anterolisthesis of C2 on C3. 4.  Complete opacification of the right lung apex, suspect large pleural effusion. Electronically Signed   By: Jacqulynn Cadet M.D.   On: 07/02/2020 17:14   DG Hand Complete Right  Result Date: 07/02/2020 CLINICAL DATA:  Deformity of RIGHT fourth and fifth finger. EXAM: RIGHT HAND - COMPLETE 3+ VIEW COMPARISON:  None FINDINGS: Dislocation of the interphalangeal joint between proximal and middle phalanx of the small finger of the RIGHT hand with ulnar displacement and dorsal displacement of the distal finger. There is over riding of the displaced middle phalanx extending approximately 6 mm proximal along the dorsal aspect of the proximal phalanx. Deformity of the RIGHT first digit/thumb with what is likely chronic deformity though could represent injury to the interphalangeal joint due to hyper extension at this joint. No visible fracture in this location though surrounding degenerative changes makes assessment difficult. No visible fracture. Extensive arthritic changes throughout the hand involving first [place],  first metacarpal phalangeal and all interphalangeal joints. No radiopaque foreign body in the soft tissues. IMPRESSION: Dislocation of the interphalangeal joint of the small finger/fifth digit of the RIGHT hand as described. Suspect chronic deformity of the RIGHT thumb due to arthritic changes. Correlate with any pain in this area with dedicated radiography as warranted. Electronically Signed   By: Zetta Bills M.D.   On: 07/02/2020 17:33    Positive ROS: All other systems have been reviewed and were otherwise negative with the exception of those mentioned in the HPI and as above.  Physical Exam: General: Alert, no acute distress Cardiovascular: No pedal edema Respiratory: No cyanosis, no use of accessory musculature GI: No organomegaly, abdomen is soft and non-tender Skin: No lesions in the area of chief complaint Neurologic: Sensation intact distally Psychiatric: Patient is competent for consent with normal mood and affect Lymphatic: No axillary or cervical lymphadenopathy  MUSCULOSKELETAL:There is obvious deformity of the right fifth digit.  There is no available range of motion of the PIP of the fifth digit.  There is surrounding soft tissue swelling and ecchymosis of the palmar surface of the PIP.  There is a very small abrasion to the medial surface of the PIP that appears superficial.  No lower extremity tenderness nor edema.  Negative logroll, no obvious deformities.  No joint effusions. . Compartments soft. Good cap refill. Motor and sensory intact distally.  Assessment: Dislocation of the interphalangeal joint of the small finger/fifth digit of the RIGHT hand   Plan: Dr. Harlow Mares was consulted, Finger unable to be reduced Dr. Harlow Mares consulted our upper extremity specialist Dr. Peggye Ley who will follow as outpatient Patient admitted for lung effusion Please call Dr. Cherylann Parr office [phone] to confirm follow up in office once discharged.    Carlynn Spry,  PA-C    07/03/2020 8:07 AM

## 2020-07-03 NOTE — Procedures (Signed)
PROCEDURE SUMMARY:  Successful US guided right thoracentesis. Yielded 2 L of amber-colored fluid. Pt tolerated procedure well. No immediate complications.  Specimen sent for labs. CXR ordered; no post-procedure pneumothorax identified  EBL < 2 mL  Theresa Duty, NP 07/03/2020 10:47 AM

## 2020-07-03 NOTE — Progress Notes (Signed)
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  In the ER, work up for fall, incidental right pleural effusion noted on CT c-spine.  Follow up CXR showed a large right pleural effusion        Assessment & Plan:  Pleural effusion Obtained thoracentesis this morning, 2L amber fluid, exudate, cytology pending.  CT chest with contrast obtained afterwards showed old multiple rib fractures with adjacent RML and RLL infiltrates with small effusion residual.    There was initial concern that this effusion was malignant and related to his squamous skin cancer last summer.  CT does not support this, rather militates for the likelihood that this was parapneumonic in the setting of multiple rib fractures.  It was not a hemothorax.  -Follow cytology  -Continue ciprofloxacin for now (this was for UTI) -Will plan to extend with Augmentin at discharge and follow up at discharge with pulmonology   Dislocated right fifth digit - Follow-up with hand surgery, Dr. Jackqulyn Livings after discharge  Rib fracture 8th fracture is acute, multiple fractures are favored to be subacute. -Analgesia with oxycodone -Aggressive IS  Fall Patient feels symptomatic after his BP meds -Hold HCTZ and ACEi  Recent UTI -Continue ciprofloxacin day 5 of 7  Hypertension BP very labile here -Hold ACEi, HCTZ -Continue metoprolol  BPH -Continue finasteride and Flomax  Hyponatremia Mild, asymptomatic, due to effusion  Chest pain Electrocardiogram ordered for the morning, observe overnight.  I have extremely low suspicion for ACS. -Follow up EKG in AM             Disposition: Status is: Inpatient  Remains inpatient appropriate  because:Inpatient level of care appropriate due to severity of illness   Dispo: The patient is from: Home              Anticipated d/c is to: Home              Anticipated d/c date is: 1 day              Patient currently is not medically stable to d/c.      The patient is a 87 year old male admitted with what now appears to me to be a parapneumonic effusion.  Given his age, the presence of multiple rib fractures, and persistent airspace disease on CT, I think it is warranted to observe him overnight prior to discharge tomorrow  He will need aggressive incentive spirometry, analgesia.  If he is able to ambulate without oxygen tomorrow, likely home        MDM: The below labs and imaging reports were reviewed and summarized above.  Medication management as above.   DVT prophylaxis: enoxaparin (LOVENOX) injection 40 mg Start: 07/02/20 2245  Code Status: DNR Family Communication: daughter by phone             Subjective: Patient is having shooting chest pain.  No dyspnea, sputum, confusion.  Objective: Vitals:   07/03/20 0809 07/03/20 0959 07/03/20 1027 07/03/20 1151  BP: 138/73 (!) 147/78 108/73 111/67  Pulse: 64 73 87 87  Resp: 18 18  16   Temp: 98.1 F (36.7 C)   97.7 F (36.5 C)  TempSrc: Oral  Oral  SpO2: 95% 98% 93% 95%  Weight:      Height:        Intake/Output Summary (Last 24 hours) at 07/03/2020 1703 Last data filed at 07/03/2020 1023 Gross per 24 hour  Intake 120 ml  Output --  Net 120 ml   Filed Weights   07/02/20 1625 07/03/20 0054  Weight: 95.3 kg 89.2 kg    Examination: General appearance:  adult male, alert and in no acute distress.  Sitting in recliner HEENT: Anicteric, conjunctiva pink, lids and lashes normal. No nasal deformity, discharge, epistaxis.  Lips moist.   Skin: Warm and dry.  no jaundice.  No suspicious rashes or lesions. Cardiac: RRR, nl S1-S2, no murmurs appreciated.  Capillary refill is brisk.  JVP not visible.  o LE  edema.  Radial  pulses 2+ and symmetric. Respiratory: Normal respiratory rate and rhythm.  CTAB without rales or wheezes.  Diminished on the right. Abdomen: Abdomen soft.  no TTP or guarding. No ascites, distension, hepatosplenomegaly.  Old ventral hernia. MSK: No deformities or effusions.  The right fifth digit is deformed Neuro: Awake and alert.  EOMI, moves all extremities but with severe generalized weakness. Speech fluent.    Psych: Sensorium intact and responding to questions, attention normal. Affect normal.  Judgment and insight appear normal.    Data Reviewed: I have personally reviewed following labs and imaging studies:  CBC: Recent Labs  Lab 07/02/20 1830 07/03/20 0511  WBC 12.5* 10.9*  NEUTROABS 9.7*  --   HGB 12.0* 13.1  HCT 36.3* 38.3*  MCV 88.8 87.2  PLT 264 740   Basic Metabolic Panel: Recent Labs  Lab 07/02/20 1830  NA 133*  K 3.5  CL 92*  CO2 31  GLUCOSE 157*  BUN 12  CREATININE 0.86  CALCIUM 8.7*   GFR: Estimated Creatinine Clearance: 52.3 mL/min (by C-G formula based on SCr of 0.86 mg/dL). Liver Function Tests: Recent Labs  Lab 07/02/20 1830  AST 19  ALT 10  ALKPHOS 91  BILITOT 0.6  PROT 7.5  ALBUMIN 3.6   No results for input(s): LIPASE, AMYLASE in the last 168 hours. No results for input(s): AMMONIA in the last 168 hours. Coagulation Profile: Recent Labs  Lab 07/02/20 2140  INR 1.1   Cardiac Enzymes: No results for input(s): CKTOTAL, CKMB, CKMBINDEX, TROPONINI in the last 168 hours. BNP (last 3 results) No results for input(s): PROBNP in the last 8760 hours. HbA1C: No results for input(s): HGBA1C in the last 72 hours. CBG: No results for input(s): GLUCAP in the last 168 hours. Lipid Profile: No results for input(s): CHOL, HDL, LDLCALC, TRIG, CHOLHDL, LDLDIRECT in the last 72 hours. Thyroid Function Tests: No results for input(s): TSH, T4TOTAL, FREET4, T3FREE, THYROIDAB in the last 72 hours. Anemia Panel: No results for  input(s): VITAMINB12, FOLATE, FERRITIN, TIBC, IRON, RETICCTPCT in the last 72 hours. Urine analysis:    Component Value Date/Time   COLORURINE YELLOW (A) 07/02/2020 2140   APPEARANCEUR HAZY (A) 07/02/2020 2140   LABSPEC 1.014 07/02/2020 2140   PHURINE 5.0 07/02/2020 2140   GLUCOSEU NEGATIVE 07/02/2020 2140   HGBUR NEGATIVE 07/02/2020 2140   BILIRUBINUR NEGATIVE 07/02/2020 2140   KETONESUR 5 (A) 07/02/2020 2140   PROTEINUR NEGATIVE 07/02/2020 2140   NITRITE NEGATIVE 07/02/2020 2140   LEUKOCYTESUR LARGE (A) 07/02/2020 2140   Sepsis Labs: @LABRCNTIP (procalcitonin:4,lacticacidven:4)  ) Recent Results (from the past 240 hour(s))  Resp Panel by RT-PCR (Flu A&B, Covid)     Status: None  Collection Time: 07/02/20 10:20 PM  Result Value Ref Range Status   SARS Coronavirus 2 by RT PCR NEGATIVE NEGATIVE Final    Comment: (NOTE) SARS-CoV-2 target nucleic acids are NOT DETECTED.  The SARS-CoV-2 RNA is generally detectable in upper respiratory specimens during the acute phase of infection. The lowest concentration of SARS-CoV-2 viral copies this assay can detect is 138 copies/mL. A negative result does not preclude SARS-Cov-2 infection and should not be used as the sole basis for treatment or other patient management decisions. A negative result may occur with  improper specimen collection/handling, submission of specimen other than nasopharyngeal swab, presence of viral mutation(s) within the areas targeted by this assay, and inadequate number of viral copies(<138 copies/mL). A negative result must be combined with clinical observations, patient history, and epidemiological information. The expected result is Negative.  Fact Sheet for Patients:  EntrepreneurPulse.com.au  Fact Sheet for Healthcare Providers:  IncredibleEmployment.be  This test is no t yet approved or cleared by the [place] FDA and  has been authorized for detection and/or  diagnosis of SARS-CoV-2 by FDA under an Emergency Use Authorization (EUA). This EUA will remain  in effect (meaning this test can be used) for the duration of the COVID-19 declaration under Section 564(b)(1) of the Act, 21 U.S.C.section 360bbb-3(b)(1), unless the authorization is terminated  or revoked sooner.       Influenza A by PCR NEGATIVE NEGATIVE Final   Influenza B by PCR NEGATIVE NEGATIVE Final    Comment: (NOTE) The Xpert Xpress SARS-CoV-2/FLU/RSV plus assay is intended as an aid in the diagnosis of influenza from Nasopharyngeal swab specimens and should not be used as a sole basis for treatment. Nasal washings and aspirates are unacceptable for Xpert Xpress SARS-CoV-2/FLU/RSV testing.  Fact Sheet for Patients: EntrepreneurPulse.com.au  Fact Sheet for Healthcare Providers: IncredibleEmployment.be  This test is not yet approved or cleared by the [place] FDA and has been authorized for detection and/or diagnosis of SARS-CoV-2 by FDA under an Emergency Use Authorization (EUA). This EUA will remain in effect (meaning this test can be used) for the duration of the COVID-19 declaration under Section 564(b)(1) of the Act, 21 U.S.C. section 360bbb-3(b)(1), unless the authorization is terminated or revoked.  Performed at [facility], [address] [place], [place] [address]          Radiology Studies: DG Ribs Unilateral W/Chest Right  Result Date: 07/02/2020 CLINICAL DATA:  87 year old male with right chest wall pain. EXAM: RIGHT RIBS AND CHEST - 3+ VIEW COMPARISON:  Chest radiograph dated 10/07/2016. FINDINGS: Moderate right pleural effusion with right lung base atelectasis or infiltrate. Underlying mass is not excluded clinical correlation and follow-up to resolution recommended. The left lung is clear. No pneumothorax. Stable cardiac silhouette. Atherosclerotic calcification of the aorta. Osteopenia with degenerative  changes of the spine. Multiple old healed right posterior rib fractures. Several old right posterior rib fractures with incomplete healing. There is apparent acute fracture of the lateral right eighth rib. IMPRESSION: 1. Probable acute fracture of the lateral right eighth rib. No pneumothorax. 2. Moderate right pleural effusion with right lung base atelectasis or infiltrate. Electronically Signed   By: Anner Crete M.D.   On: 07/02/2020 17:27   CT Head Wo Contrast  Result Date: 07/02/2020 CLINICAL DATA:  87 year old male status post fall EXAM: CT HEAD WITHOUT CONTRAST CT CERVICAL SPINE WITHOUT CONTRAST TECHNIQUE: Multidetector CT imaging of the head and cervical spine was performed following the standard protocol without intravenous contrast. Multiplanar CT  image reconstructions of the cervical spine were also generated. COMPARISON:  None. FINDINGS: CT HEAD FINDINGS Brain: No evidence of acute infarction, hemorrhage, hydrocephalus, extra-axial collection or mass lesion/mass effect. Very mild age related involutional changes including cerebral and cortical volume loss and minimal (for age) chronic microvascular ischemic white matter disease. Vascular: No hyperdense vessel or unexpected calcification. Skull: Normal. Negative for fracture or focal lesion. Sinuses/Orbits: No acute finding. Other: Small right frontal scalp hematoma with associated laceration. CT CERVICAL SPINE FINDINGS Alignment: Normal. Skull base and vertebrae: No acute fracture or malalignment. Soft tissues and spinal canal: No prevertebral fluid or swelling. No visible canal hematoma. Disc levels: Multilevel cervical spondylosis. Degenerative anterolisthesis of C2 on C3 measures approximately 4 mm. Multilevel posterior disc osteophyte complexes likely resulting in central stenosis. The level of most severe stenosis include C3-C4 and C6-C7. Upper chest: Complete opacification of the right lung apex. Suspect large effusion. Other: None.  IMPRESSION: CT HEAD 1. No acute intracranial abnormality. 2. Right frontal scalp hematoma and laceration without evidence of underlying calvarial fracture. 3. Minimal age related involutional changes. CT CSPINE 1. No acute fracture or malalignment. 2. Multilevel cervical spondylosis with significant central canal stenosis at C3-C4 and C6-C7. 3. Degenerative anterolisthesis of C2 on C3. 4. Complete opacification of the right lung apex, suspect large pleural effusion. Electronically Signed   By: Jacqulynn Cadet M.D.   On: 07/02/2020 17:14   CT CHEST W CONTRAST  Result Date: 07/03/2020 CLINICAL DATA:  Pneumonia, effusion, or abscess suspected. Follow-up post thoracentesis is morning. EXAM: CT CHEST WITH CONTRAST TECHNIQUE: Multidetector CT imaging of the chest was performed during intravenous contrast administration. CONTRAST:  17mL OMNIPAQUE IOHEXOL 300 MG/ML  SOLN COMPARISON:  Chest x-ray July 03, 2020.  CT scan Nov 14, 2018. FINDINGS: Cardiovascular: Calcified atherosclerosis is seen in the LAD and right coronary arteries. The heart size is normal. Calcified atherosclerosis is seen in the nonaneurysmal thoracic aorta. No aortic dissection identified. Central pulmonary arteries are normal in caliber. No central emboli are identified. Mediastinum/Nodes: There is a small right-sided pleural effusion. No left-sided pleural effusion or pericardial effusion. The esophagus and thyroid are normal. No adenopathy in the chest. Soft tissues of the chest wall are unremarkable. Lungs/Pleura: Central airways are normal. No pneumothorax. Infiltrate with air bronchograms is seen in the right middle lobe. Patchy infiltrate is seen in the right lower lobe. No infiltrates are identified on the left. No suspicious masses or nodules. Upper Abdomen: There is a cyst in the left kidney. Ill-defined splenic defect on axial image 148 is favored to represent sequela of previous trauma or infarct. An acute subtle laceration without  active extravasation is not completely excluded. No other abnormalities. Musculoskeletal: Age indeterminate fractures through the posterior right eleventh, tenth, and ninth ribs. These fractures are favored to be subacute rather than acute. Healed right eighth rib fracture. Healed right seventh rib fracture. IMPRESSION: 1. Right middle and lower lobe infiltrates worrisome for pneumonia or aspiration. There is a small to moderate associated effusion. Recommend clinical correlation and follow-up to resolution. 2. Multiple right-sided rib fractures are age indeterminate but favored to be subacute rather than acute. 3. Subtle defect in the spleen is favored to represent sequela of previous trauma or infarct. There is no adjacent fluid or active extravasation. A subtle acute laceration of the spleen is considered less likely. Recommend attention on follow-up. 4. No other acute abnormalities. These results will be called to the ordering clinician or representative by the Radiologist Assistant, and communication documented  in the PACS or [employer]. Aortic Atherosclerosis (ICD10-I70.0). Electronically Signed   By: Dorise Bullion III M.D   On: 07/03/2020 16:05   CT Cervical Spine Wo Contrast  Result Date: 07/02/2020 CLINICAL DATA:  87 year old male status post fall EXAM: CT HEAD WITHOUT CONTRAST CT CERVICAL SPINE WITHOUT CONTRAST TECHNIQUE: Multidetector CT imaging of the head and cervical spine was performed following the standard protocol without intravenous contrast. Multiplanar CT image reconstructions of the cervical spine were also generated. COMPARISON:  None. FINDINGS: CT HEAD FINDINGS Brain: No evidence of acute infarction, hemorrhage, hydrocephalus, extra-axial collection or mass lesion/mass effect. Very mild age related involutional changes including cerebral and cortical volume loss and minimal (for age) chronic microvascular ischemic white matter disease. Vascular: No hyperdense vessel or unexpected  calcification. Skull: Normal. Negative for fracture or focal lesion. Sinuses/Orbits: No acute finding. Other: Small right frontal scalp hematoma with associated laceration. CT CERVICAL SPINE FINDINGS Alignment: Normal. Skull base and vertebrae: No acute fracture or malalignment. Soft tissues and spinal canal: No prevertebral fluid or swelling. No visible canal hematoma. Disc levels: Multilevel cervical spondylosis. Degenerative anterolisthesis of C2 on C3 measures approximately 4 mm. Multilevel posterior disc osteophyte complexes likely resulting in central stenosis. The level of most severe stenosis include C3-C4 and C6-C7. Upper chest: Complete opacification of the right lung apex. Suspect large effusion. Other: None. IMPRESSION: CT HEAD 1. No acute intracranial abnormality. 2. Right frontal scalp hematoma and laceration without evidence of underlying calvarial fracture. 3. Minimal age related involutional changes. CT CSPINE 1. No acute fracture or malalignment. 2. Multilevel cervical spondylosis with significant central canal stenosis at C3-C4 and C6-C7. 3. Degenerative anterolisthesis of C2 on C3. 4. Complete opacification of the right lung apex, suspect large pleural effusion. Electronically Signed   By: Jacqulynn Cadet M.D.   On: 07/02/2020 17:14   DG Chest Port 1 View  Result Date: 07/03/2020 CLINICAL DATA:  Status post right thoracentesis. EXAM: PORTABLE CHEST 1 VIEW COMPARISON:  Yesterday. FINDINGS: Moderate-sized right pleural effusion, decreased. No pneumothorax. Normal sized heart. Acute and old right 8th rib fractures are again demonstrated. Thoracic spine degenerative changes. IMPRESSION: 1. Moderate-sized right pleural effusion, decreased. 2. No pneumothorax. 3. Acute and old right 8th rib fractures. Electronically Signed   By: Claudie Revering M.D.   On: 07/03/2020 10:41   DG Hand Complete Right  Result Date: 07/02/2020 CLINICAL DATA:  Deformity of RIGHT fourth and fifth finger. EXAM: RIGHT HAND  - COMPLETE 3+ VIEW COMPARISON:  None FINDINGS: Dislocation of the interphalangeal joint between proximal and middle phalanx of the small finger of the RIGHT hand with ulnar displacement and dorsal displacement of the distal finger. There is over riding of the displaced middle phalanx extending approximately 6 mm proximal along the dorsal aspect of the proximal phalanx. Deformity of the RIGHT first digit/thumb with what is likely chronic deformity though could represent injury to the interphalangeal joint due to hyper extension at this joint. No visible fracture in this location though surrounding degenerative changes makes assessment difficult. No visible fracture. Extensive arthritic changes throughout the hand involving first [place], first metacarpal phalangeal and all interphalangeal joints. No radiopaque foreign body in the soft tissues. IMPRESSION: Dislocation of the interphalangeal joint of the small finger/fifth digit of the RIGHT hand as described. Suspect chronic deformity of the RIGHT thumb due to arthritic changes. Correlate with any pain in this area with dedicated radiography as warranted. Electronically Signed   By: Zetta Bills M.D.   On: 07/02/2020 17:33  US THORACENTESIS ASP PLEURAL SPACE W/IMG GUIDE  Result Date: 07/03/2020 INDICATION: Patient with a medical history significant for squamous carcinoma on his right back presents today with suspected malignant pleural effusion. Interventional radiology asked to perform a therapeutic and diagnostic thoracentesis. EXAM: ULTRASOUND GUIDED THORACENTESIS MEDICATIONS: 1% lidocaine 10 mL COMPLICATIONS: None immediate. PROCEDURE: An ultrasound guided thoracentesis was thoroughly discussed with the patient and questions answered. The benefits, risks, alternatives and complications were also discussed. The patient understands and wishes to proceed with the procedure. Written consent was obtained. Ultrasound was performed to localize and mark an adequate  pocket of fluid in the right chest. The area was then prepped and draped in the normal sterile fashion. 1% Lidocaine was used for local anesthesia. Under ultrasound guidance a 6 Fr Safe-T-Centesis catheter was introduced. Thoracentesis was performed. The catheter was removed and a dressing applied. FINDINGS: A total of approximately 2 L of amber-colored fluid was removed. Samples were sent to the laboratory as requested by the clinical team. IMPRESSION: Successful ultrasound guided right thoracentesis yielding 2 L of pleural fluid. Read by: Soyla Dryer, NP Electronically Signed   By: Aletta Edouard M.D.   On: 07/03/2020 11:01        Scheduled Meds: . ciprofloxacin  250 mg Oral BID  . enoxaparin (LOVENOX) injection  40 mg Subcutaneous Q24H  . finasteride  5 mg Oral QPM  . lidocaine-EPINEPHrine-tetracaine  3 mL Topical Once  . metoprolol tartrate  50 mg Oral QPM  . multivitamin with minerals  1 tablet Oral Daily  . omega-3 acid ethyl esters  1 g Oral Daily  . pantoprazole  40 mg Oral Daily  . tamsulosin  0.4 mg Oral QPM   Continuous Infusions:   LOS: 1 day    Time spent: 25 minutes    Edwin Dada, MD [facility] 07/03/2020, 5:03 PM     Please page though [employer] or Epic secure chat:  For [employer], Adult nurse

## 2020-07-03 NOTE — Evaluation (Signed)
Physical Therapy Evaluation Patient Details Name: Tyler Pena MRN: [id number] DOB: March 11, 1921 Today's Date: 07/03/2020   History of Present Illness  presented to ER secondary to fall to R side, worsening SOB; admitted for management of large R-sided pleural effusion, s/p R thoracentesis with removal of 2L fluid.  Also noted with 8th rib fracture, dislocated joint of IP joint on R fifth digit and bruising/abrasion to R forehead, bilat knees.  Clinical Impression  Upon evaluation, patient alert and oriented; follows commands and agreeable to session with min encouragement.  Endorses generalized fatigue post-thoracentesis date and reports musculoskeletal pain in R lateral chest/flank (?due to rib fractures?), FACES 4/10.  Bilat UE/LE strength and ROM grossly symmetrical and WFL; limitations in R 5th digit due to orthopedic injury (self-limits WBing and functional use).  Able to complete bed mobility with mod indep; sit/stand, basic transfers and gait (5') with RW.  Demonstrates fair step height/length; mod WBing on RW; fair dynamic balance.  Good safety awareness/insight; voices plans to initiate use of RW at discharge. Would benefit from skilled PT to address above deficits and promote optimal return to PLOF.; Recommend transition to HHPT upon discharge from acute hospitalization.     Follow Up Recommendations Home health PT    Equipment Recommendations       Recommendations for Other Services       Precautions / Restrictions Precautions Precautions: Fall Restrictions Weight Bearing Restrictions: No      Mobility  Bed Mobility Overal bed mobility: Modified Independent                  Transfers Overall transfer level: Needs assistance Equipment used: Rolling walker (2 wheeled) Transfers: Sit to/from Stand Sit to Stand: Min assist         General transfer comment: cuing for hand placement to prevent pulling on RW  Ambulation/Gait Ambulation/Gait assistance: Min  assist Gait Distance (Feet): 5 Feet Assistive device: Rolling walker (2 wheeled)       General Gait Details: fair step height/length; mod WBing on RW; fair dynamic balance.  Good safety awareness/insight; voices plans to initiate use of RW at discharge.  Declined additional distance due to general fatigue post-thoracentesis  Stairs            Wheelchair Mobility    Modified Rankin (Stroke Patients Only)       Balance Overall balance assessment: Needs assistance Sitting-balance support: No upper extremity supported;Feet supported Sitting balance-Leahy Scale: Good     Standing balance support: Bilateral upper extremity supported Standing balance-Leahy Scale: Fair                               Pertinent Vitals/Pain Pain Assessment: Faces Faces Pain Scale: Hurts little more Pain Location: R lateral chest/flank Pain Descriptors / Indicators: Aching;Guarding;Grimacing Pain Intervention(s): Limited activity within patient's tolerance;Monitored during session;Repositioned    Home Living Family/patient expects to be discharged to:: Assisted living               Home Equipment: Kasandra Knudsen - single point;Walker - 4 wheels      Prior Function Level of Independence: Independent with assistive device(s)         Comments: Mod indep with SPC for transfers, gait     Hand Dominance   Dominant Hand: Right    Extremity/Trunk Assessment   Upper Extremity Assessment Upper Extremity Assessment: Overall WFL for tasks assessed    Lower Extremity Assessment Lower Extremity Assessment: Overall  WFL for tasks assessed (grossly at least 4/5 throughout)       Communication   Communication: No difficulties  Cognition Arousal/Alertness: Awake/alert Behavior During Therapy: WFL for tasks assessed/performed Overall Cognitive Status: Within Functional Limits for tasks assessed                                        General Comments       Exercises     Assessment/Plan    PT Assessment Patient needs continued PT services  PT Problem List Decreased strength;Decreased activity tolerance;Decreased balance;Decreased mobility;Decreased knowledge of use of DME;Decreased knowledge of precautions;Cardiopulmonary status limiting activity       PT Treatment Interventions DME instruction;Gait training;Functional mobility training;Therapeutic activities;Therapeutic exercise;Balance training;Patient/family education    PT Goals (Current goals can be found in the Care Plan section)  Acute Rehab PT Goals Patient Stated Goal: to at least stay here tonight PT Goal Formulation: With patient Time For Goal Achievement: 07/17/20 Potential to Achieve Goals: Good    Frequency Min 2X/week   Barriers to discharge Decreased caregiver support      Co-evaluation               AM-PAC PT "6 Clicks" Mobility  Outcome Measure Help needed turning from your back to your side while in a flat bed without using bedrails?: None Help needed moving from lying on your back to sitting on the side of a flat bed without using bedrails?: None Help needed moving to and from a bed to a chair (including a wheelchair)?: A Little Help needed standing up from a chair using your arms (e.g., wheelchair or bedside chair)?: A Little Help needed to walk in hospital room?: A Little Help needed climbing 3-5 steps with a railing? : A Little 6 Click Score: 20    End of Session   Activity Tolerance: Patient tolerated treatment well;Patient limited by fatigue Patient left: in chair;with call bell/phone within reach;with chair alarm set Nurse Communication: Mobility status PT Visit Diagnosis: Muscle weakness (generalized) (M62.81);Difficulty in walking, not elsewhere classified (R26.2)    Time: 8921-1941 PT Time Calculation (min) (ACUTE ONLY): 20 min   Charges:   PT Evaluation $PT Eval Moderate Complexity: 1 Mod          Brayton Baumgartner H. Owens Shark, PT, DPT,  NCS 07/03/20, 7:30 PM [phone]

## 2020-07-04 DIAGNOSIS — J90 Pleural effusion, not elsewhere classified: Secondary | ICD-10-CM | POA: Diagnosis not present

## 2020-07-04 LAB — PROCALCITONIN: Procalcitonin: <0.1 ng/mL

## 2020-07-04 LAB — URINE CULTURE: Culture: <10000 — AB

## 2020-07-04 LAB — GLUCOSE, CAPILLARY: Glucose-Capillary: 196 mg/dL — ABNORMAL HIGH (ref 70–99)

## 2020-07-04 MED ORDER — LIDOCAINE 5 % EX PTCH: 1.0000 | MEDICATED_PATCH | Freq: Two times a day (BID) | CUTANEOUS | 0 refills | Status: AC

## 2020-07-04 NOTE — TOC Initial Note (Addendum)
Transition of Care [facility]) - Initial/Assessment Note    Patient Details  Name: Tyler Pena MRN: [id number] Date of Birth: [birth year]  [facility]) CM/SW Contact:    Izola Price, RN Phone Number: 07/04/2020, 10:55 AM  Clinical Narrative:    07/04/20 Patient expects to be discharged today. Contacted daughter regarding discharge disposition to [facility] or home. Patient is to go home to live with daughter. Grandson present in home until she returns from out of town. No agency preference. Pt. Has primary care provider, is a retired MD, PT recommends [facility]. Daughter already has private pay aide that comes in to assist patient and this will continue. Spoke with grandson Claiborne Billings via phone as well to update on status, needs, discharge. [facility] accepted via [facility] [facility]. Discussed supervision/fall precautions needed with grandson and they are aware.  Communicated information, phone numbers of grandson, who will transport patient,  to provider and Unit RN via secure chat. Awaiting placement of [place] orders. [id number] [place], [place] RN CM             Expected Discharge Plan: [place] Services Barriers to Discharge: Continued Medical Work up   Patient Goals and CMS Choice Patient states their goals for this hospitalization and ongoing recovery are:: Pt a bit confused, spoke with daughter via phone.   Choice offered to / list presented to : Adult Children  Expected Discharge Plan and Services Expected Discharge Plan: [place]   Discharge Planning Services: CM Consult Post Acute Care Choice: Home Health (Has some DME at home.) Living arrangements for the past 2 months: [place]                 DME Arranged: N/A (None ordered but patient has rollator and walker at home.) DME Agency: NA       HH Arranged: PT [place] Agency: [place] ([place]) Date [place]:  07/04/20 Time [place]: 1049 Representative spoke with at [place]: Floydene Flock  Prior Living Arrangements/Services Living arrangements for the past 2 months: [place] Lives with:: Adult Children Patient language and need for interpreter reviewed:: Yes Do you feel safe going back to the place where you live?:  (Not returning to [facility] [employer] per daughter.)      Need for Family Participation in Patient Care: Yes (Comment) Care giver support system in place?: Yes (comment) Current home services: Home OT Criminal Activity/Legal Involvement Pertinent to Current Situation/Hospitalization: No - Comment as needed  Activities of Daily Living Home Assistive Devices/Equipment: None,Walker (specify type) ADL Screening (condition at time of admission) Patient's cognitive ability adequate to safely complete daily activities?: Yes Is the patient deaf or have difficulty hearing?: No Does the patient have difficulty seeing, even when wearing glasses/contacts?: No Does the patient have difficulty concentrating, remembering, or making decisions?: No Patient able to express need for assistance with ADLs?: Yes Does the patient have difficulty dressing or bathing?: No Independently performs ADLs?: Yes (appropriate for developmental age) Does the patient have difficulty walking or climbing stairs?: No Weakness of Legs: None Weakness of Arms/Hands: None  Permission Sought/Granted Permission sought to share information with : Case Manager,Facility Contact Representative,Family Supports Permission granted to share information with : Yes, Verbal Permission Granted (From daughter as patient is a bit confused.)  Share Information with NAME: Gaylord Shih and Laurence Spates  Permission granted to share info w  AGENCY: [facility]  Permission granted to share info w Relationship: Daughter and grandson  Permission granted to share info w Contact Information: [phone] (d) and  [phone] (GS)  Emotional Assessment     Affect (typically observed): Accepting Orientation: : Oriented to Self,Oriented to Place (slightly confused at times.) Alcohol / Substance Use: Not Applicable Psych Involvement: No (comment)  Admission diagnosis:  Pleural effusion [J90] Acute cystitis without hematuria [N30.00] Facial laceration, initial encounter [S01.81XA] Dislocation of proximal interphalangeal joint of finger, initial encounter [S63.289A] Fall, initial encounter [W19.XXXA] Patient Active Problem List   Diagnosis Date Noted  . Pleural effusion 07/02/2020  . 1st degree AV block 09/17/2018  . Diabetes mellitus type 2, diet-controlled ([place]) 11/28/2016  . SBO (small bowel obstruction) ([place]) 11/07/2016  . Partial small bowel obstruction ([place]) 10/28/2016  . Carcinoma ([place]) 10/12/2016  . Chest pain, rule out acute myocardial infarction 10/07/2016  . Bladder tumor 09/02/2016  . Benign prostatic hyperplasia with weak urinary stream 08/18/2016  . Coronary artery disease involving native coronary artery of native heart without angina pectoris 08/18/2016  . Essential hypertension 08/18/2016  . GERD without esophagitis 08/18/2016   PCP:  Dion Body, MD Pharmacy[facility]   [facility] [place] [address] [place] [address] Phone: [phone] Fax: [phone]  [id number] [place], [place] [address] [place] [address] Phone: [phone] Fax: [phone]     Social Determinants of Health (SDOH) Interventions    Readmission Risk Interventions No flowsheet data found.

## 2020-07-04 NOTE — TOC Transition Note (Signed)
Transition of Care [facility]) - CM/SW Discharge Note   Patient Details  Name: Ephriam Turman MRN: [id number] Date of Birth: 07-Dec-1920  Transition of Care [facility]) CM/SW Contact:  Izola Price, RN Phone Number: 07/04/2020, 3:39 PM   Clinical Narrative:   Discharged to home with [facility] via [facility]. Has DME at home. Family communicated with (daughter and grandson) Transported via grandson to home. Simmie Davies RN CM    Final next level of care: [place] Barriers to Discharge: Barriers Resolved   Patient Goals and CMS Choice Patient states their goals for this hospitalization and ongoing recovery are:: Pt a bit confused, spoke with daughter via phone.   Choice offered to / list presented to : Adult Children  Discharge Placement                       Discharge Plan and Services   Discharge Planning Services: CM Consult Post Acute Care Choice: Home Health (Has some DME at home.)          DME Arranged: N/A (None ordered but patient has rollator and walker at home.) DME Agency: NA       HH Arranged: PT [place] Agency: [place] ([place]) Date [place]: 07/04/20 Time [place]: [place] Representative spoke with at [place]: [place] (SDOH) Interventions     Readmission Risk Interventions No flowsheet data found.

## 2020-07-04 NOTE — Discharge Summary (Signed)
Physician Discharge Summary  Tyler Pena [id number] DOB: August 28, 1920 DOA: 07/02/2020  PCP: Tyler Body, MD  Admit date: 07/02/2020 Discharge date: 07/04/2020  Admitted From: ALF Disposition:  HOME  Recommendations for Outpatient Follow-up:  1. Follow up with PCP in 1-2 weeks 2. Please follow-up with Dr. Lanney Pena from pulmonary guarding her pleural effusion and pending work-up.   3. Please follow up on the following pending results: cytology form pleural fluid  Home Health:YES  Equipment/Devices: NA  Discharge Condition: Stable Code Status:   Code Status: DNR Diet recommendation:  Diet Order            Diet - low sodium heart healthy           Diet Heart Room service appropriate? 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  He was seen in the ED ON 07/02/2020.  As per the report Patient had an episode of UTI earlier this week, symptoms involving dysuria and visual hallucination, has been treated by his PCP with Cipro twice daily for last 3 days and feeling much better symptomatic wise.  This afternoon is working in the parking lot and suddenly fell " about to fall" and try to grab something and then fall on the right side and landed on his right side and hit his head on the right side with laceration.  No loss of consciousness.  Patient recently moved to live by himself in assisted living, and 2 weeks ago patient underwent a physical exam when he was told " decreasing breathing sound on the right side".  Starting from about 1 week or so, patient started to feel increasing shortness of breath and night, could not lie on his left side, and minimum exertion can trigger  shortness breath.  Denied any chest pain, no leg swelling no cough no fever chills.  Patient had a large mass develop on his right-sided upper back, underwent biopsy proved to be squamous cell carcinoma and then to have excision in [place] July 2021.  He was told no need for radiation or chemotherapy at that time.  ED Course: Vital signs showed patient has borderline low BP, CT head negative, CT neck showed central canal stenosis C3-C4 and C6-C7, and apex opacification indicating large right-sided pleural effusion.  Right fifth finger ITP dislocation, chest xray, 8th rib frx. Trop 7>130, no chest pain.  Patient was admitted outpatient Cipro for his UTI he was continued.  Further work-up showed pleural effusion underwent thoracentesis amber-colored fluid 2 L was removed, LDH normal and protein ulcer and large amount culture gram stain unremarkable suspecting transudative/reactive from rib fractures.  Head CT showed old multiple rib fractures.  Cytology was sent due to history of malignancy in the skin. Patient at this time has clinically improved does have pain where he has a fracture, troponin has been up x1 in the ED but subsequent 1 has been improved no chest pain, EKG personally reviewed and 1/8 no ST or T wave changes. He is ambulating well.  He would like to go home.  Seen by PT OT and setting up home health PT on discharge. He is advised to repeat chest  x-ray in 1-2 weeks from PCP  Discharge Diagnoses:   Pleural effusion in the setting of rib fractures, transudative likely reactive.  Cytology pending.  Follow-up chest x-ray continue pain control.  CT chest shows "Right middle and lower lobe infiltrates worrisome for pneumonia or aspiration. There is a small to moderate associated effusion.Recommend clinical correlation and follow-up to resolution."  Work-up transudative as LDH only 83 and protein  < Pena protein.  Procalcitonin less than 0.1 ruling out pneumonia.  I discussed with Dr.   Leilani Pena and he feels it is "Lymphocyte predominant transudative effusion in context of ct findings with possible post obstructive process per ct report. Negative procal and he will follow up in clinic regarding possible neoplasm related effusion and goals of care  This was communicated to patient's daughter for the need for pulmonary follow-up over the phone.  UTI  completed Cipro 07/04/20  Dislocated right fifth digit follow-up with hand surgeon Dr. Jackqulyn Pena after discharge. He was seen by orthopedics.  Rib fracture eighth rib fracture is acute multiple fractures are favored to be subacute continue pain control incentive spirometry Hypertension he was symptomatic after BP meds - 2/2 Fall- holding HCTZ and ACE inhibitor blood pressure has been stable. BPH continue finasteride and Flomax Hyponatremia mild asymptomatic. Chest pain atypical from rib fracture.  Consults:  Orthopedics Subjective: Alert awake resting comfortably feels fine.  Feels ready for discharge.  Discharge Exam: Vitals:   07/04/20 0921 07/04/20 1157  BP: 128/63 (!) 132/57  Pulse: 67 68  Resp: 20 20  Temp:  98.2 F (36.8 C)  SpO2: 97% 96%   General: Pt is alert, awake, not in acute distress Cardiovascular: RRR, S1/S2 +, no rubs, no gallops Respiratory: CTA bilaterally, no wheezing, no rhonchi Abdominal: Soft, NT, ND, bowel sounds + Extremities: no edema, no cyanosis  Discharge Instructions  Discharge Instructions    Diet - low sodium heart healthy   Complete by: As directed    Discharge instructions   Complete by: As directed    Please obtain chest x-ray in 1 week from your primary care doctor.  You are advised to follow-up with a hand surgeon  Follow-up with the pulmonology.  Please call call MD or return to ER for similar or worsening recurring problem that brought you to hospital or if any fever,nausea/vomiting,abdominal pain, uncontrolled pain, chest pain,  shortness of breath or any other  alarming symptoms.  Please follow-up your doctor as instructed in a week time and call the office for appointment.  Please avoid alcohol, smoking, or any other illicit substance and maintain healthy habits including taking your regular medications as prescribed.  You were cared for by a hospitalist during your hospital stay. If you have any questions about your discharge medications or the care you received while you were in the hospital after you are discharged, you can call the unit and ask to speak with the hospitalist on call if the hospitalist that took care of you is not available.  Once you are discharged, your primary care physician will handle any further medical issues. Please note that NO REFILLS for any discharge medications will be authorized once you are discharged, as it is imperative that you return to your primary care physician (or establish a relationship with a primary care physician if you do not have one) for your aftercare needs so that they can reassess your need for medications and monitor your lab values   Increase activity slowly   Complete by: As directed  Allergies as of 07/04/2020      Reactions   Fenofibrate    Other reaction(s): Muscle Pain   Losartan Other (See Comments)   Caused weakness, chest pressure, and shortness of breath      Medication List    STOP taking these medications   hydrochlorothiazide 12.5 MG capsule Commonly known as: MICROZIDE   lisinopril 5 MG tablet Commonly known as: ZESTRIL   losartan 50 MG tablet Commonly known as: COZAAR   metoprolol succinate 25 MG 24 hr tablet Commonly known as: TOPROL-XL   pantoprazole 40 MG tablet Commonly known as: PROTONIX     TAKE these medications   acetaminophen 500 MG tablet Commonly known as: TYLENOL Take by mouth.   aspirin EC 81 MG tablet Take 81 mg by mouth daily.   Black Pepper-Turmeric 3-500 MG Caps Take 2 capsules by mouth daily.   CINNAMON PLUS CHROMIUM PO Take 1 tablet  by mouth 2 (two) times daily. 1000mg  cinnamon/  250mg  chromium   finasteride 5 MG tablet Commonly known as: PROSCAR Take 5 mg by mouth every evening.   lidocaine 5 % Commonly known as: Lidoderm Place 1 patch onto the skin every 12 (twelve) hours for 5 days. Remove & Discard patch within 12 hours or as directed by MD   meloxicam 15 MG tablet Commonly known as: MOBIC Take 15 mg by mouth daily as needed.   metoprolol tartrate 25 MG tablet Commonly known as: LOPRESSOR Take 50 mg by mouth every evening.   multivitamin with minerals Tabs tablet Take 1 tablet by mouth daily.   mupirocin cream 2 % Commonly known as: BACTROBAN Apply to skin during each bandage change   omeprazole 20 MG capsule Commonly known as: PRILOSEC Take by mouth daily as needed.   polyethylene glycol powder 17 GM/SCOOP powder Commonly known as: GLYCOLAX/MIRALAX Take by mouth daily as needed.   Super Omega 3 EPA/DHA 1000 MG Caps Take 1 capsule by mouth daily. Omega 3 - DHA - EPA - Fish oil-- 1000mg / 120mg / 180mg    tamsulosin 0.4 MG Caps capsule Commonly known as: FLOMAX Take 0.4 mg by mouth every evening.       Follow-up Information    Rutherford Limerick, MD. Schedule an appointment as soon as possible for a visit in 1 week(s).   Specialty: Orthopedic Surgery Why: [id number]        Tyler Body, MD. Schedule an appointment as soon as possible for a visit in 1 week(s).   Specialty: Family Medicine Contact information: [place] [address] [phone]        Ottie Glazier, MD. Schedule an appointment as soon as possible for a visit in 2 week(s).   Specialty: Pulmonary Disease Why: Pulmonologist Contact information: [place] [address] [phone]              Allergies  Allergen Reactions  . Fenofibrate     Other reaction(s): Muscle Pain  . Losartan Other (See Comments)    Caused weakness,  chest pressure, and shortness of breath    The results of significant diagnostics from this hospitalization (including imaging, microbiology, ancillary and laboratory) are listed below for reference.    Microbiology: Recent Results (from the past 240 hour(s))  Urine culture     Status: Abnormal   Collection Time: 07/02/20  9:40 PM   Specimen: Urine, Random  Result Value Ref Range Status   Specimen Description   Final    URINE,  RANDOM Performed at [facility], [address] [place], [place] [address]    Special Requests   Final    NONE Performed at [facility], [address] [place], [place] [address]    Culture (A)  Final    <10,000 COLONIES/mL INSIGNIFICANT GROWTH Performed at [facility] [address], [place], [place] [address]    Report Status 07/04/2020 FINAL  Final  Resp Panel by RT-PCR (Flu A&B, Covid)     Status: None   Collection Time: 07/02/20 10:20 PM  Result Value Ref Range Status   SARS Coronavirus 2 by RT PCR NEGATIVE NEGATIVE Final    Comment: (NOTE) SARS-CoV-2 target nucleic acids are NOT DETECTED.  The SARS-CoV-2 RNA is generally detectable in upper respiratory specimens during the acute phase of infection. The lowest concentration of SARS-CoV-2 viral copies this assay can detect is 138 copies/mL. A negative result does not preclude SARS-Cov-2 infection and should not be used as the sole basis for treatment or other patient management decisions. A negative result may occur with  improper specimen collection/handling, submission of specimen other than nasopharyngeal swab, presence of viral mutation(s) within the areas targeted by this assay, and inadequate number of viral copies(<138 copies/mL). A negative result must be combined with clinical observations, patient history, and epidemiological information. The expected result is Negative.  Fact Sheet for Patients:  EntrepreneurPulse.com.au  Fact Sheet  for Healthcare Providers:  IncredibleEmployment.be  This test is no t yet approved or cleared by the [place] FDA and  has been authorized for detection and/or diagnosis of SARS-CoV-2 by FDA under an Emergency Use Authorization (EUA). This EUA will remain  in effect (meaning this test can be used) for the duration of the COVID-19 declaration under Section 564(b)(1) of the Act, 21 U.S.C.section 360bbb-3(b)(1), unless the authorization is terminated  or revoked sooner.       Influenza A by PCR NEGATIVE NEGATIVE Final   Influenza B by PCR NEGATIVE NEGATIVE Final    Comment: (NOTE) The Xpert Xpress SARS-CoV-2/FLU/RSV plus assay is intended as an aid in the diagnosis of influenza from Nasopharyngeal swab specimens and should not be used as a sole basis for treatment. Nasal washings and aspirates are unacceptable for Xpert Xpress SARS-CoV-2/FLU/RSV testing.  Fact Sheet for Patients: EntrepreneurPulse.com.au  Fact Sheet for Healthcare Providers: IncredibleEmployment.be  This test is not yet approved or cleared by the [place] FDA and has been authorized for detection and/or diagnosis of SARS-CoV-2 by FDA under an Emergency Use Authorization (EUA). This EUA will remain in effect (meaning this test can be used) for the duration of the COVID-19 declaration under Section 564(b)(1) of the Act, 21 U.S.C. section 360bbb-3(b)(1), unless the authorization is terminated or revoked.  Performed at [facility], [address] [place], [place] [address]   Pena fluid culture     Status: None (Preliminary result)   Collection Time: 07/03/20 10:15 AM   Specimen: PATH Cytology Pleural fluid  Result Value Ref Range Status   Specimen Description   Final    PLEURAL Performed at [facility], [address] [place], [place] [address]    Special Requests   Final    NONE Performed at [facility], [place], [place] [address]    Gram Stain NO WBC SEEN NO ORGANISMS SEEN   Final   Culture   Final    NO GROWTH < 24 HOURS Performed at [facility], [place]  [address] [place], [place] [address]    Report Status PENDING  Incomplete    Procedures/Studies: DG Ribs Unilateral W/Chest Right  Result Date: 07/02/2020 CLINICAL DATA:  87 year old male with right chest wall pain. EXAM: RIGHT RIBS AND CHEST - 3+ VIEW COMPARISON:  Chest radiograph dated 10/07/2016. FINDINGS: Moderate right pleural effusion with right lung base atelectasis or infiltrate. Underlying mass is not excluded clinical correlation and follow-up to resolution recommended. The left lung is clear. No pneumothorax. Stable cardiac silhouette. Atherosclerotic calcification of the aorta. Osteopenia with degenerative changes of the spine. Multiple old healed right posterior rib fractures. Several old right posterior rib fractures with incomplete healing. There is apparent acute fracture of the lateral right eighth rib. IMPRESSION: 1. Probable acute fracture of the lateral right eighth rib. No pneumothorax. 2. Moderate right pleural effusion with right lung base atelectasis or infiltrate. Electronically Signed   By: Anner Crete M.D.   On: 07/02/2020 17:27   CT Head Wo Contrast  Result Date: 07/02/2020 CLINICAL DATA:  87 year old male status post fall EXAM: CT HEAD WITHOUT CONTRAST CT CERVICAL SPINE WITHOUT CONTRAST TECHNIQUE: Multidetector CT imaging of the head and cervical spine was performed following the standard protocol without intravenous contrast. Multiplanar CT image reconstructions of the cervical spine were also generated. COMPARISON:  None. FINDINGS: CT HEAD FINDINGS Brain: No evidence of acute infarction, hemorrhage, hydrocephalus, extra-axial collection or mass lesion/mass effect. Very mild age related involutional changes including cerebral and cortical volume loss and minimal (for age) chronic microvascular  ischemic white matter disease. Vascular: No hyperdense vessel or unexpected calcification. Skull: Normal. Negative for fracture or focal lesion. Sinuses/Orbits: No acute finding. Other: Small right frontal scalp hematoma with associated laceration. CT CERVICAL SPINE FINDINGS Alignment: Normal. Skull base and vertebrae: No acute fracture or malalignment. Soft tissues and spinal canal: No prevertebral fluid or swelling. No visible canal hematoma. Disc levels: Multilevel cervical spondylosis. Degenerative anterolisthesis of C2 on C3 measures approximately 4 mm. Multilevel posterior disc osteophyte complexes likely resulting in central stenosis. The level of most severe stenosis include C3-C4 and C6-C7. Upper chest: Complete opacification of the right lung apex. Suspect large effusion. Other: None. IMPRESSION: CT HEAD 1. No acute intracranial abnormality. 2. Right frontal scalp hematoma and laceration without evidence of underlying calvarial fracture. 3. Minimal age related involutional changes. CT CSPINE 1. No acute fracture or malalignment. 2. Multilevel cervical spondylosis with significant central canal stenosis at C3-C4 and C6-C7. 3. Degenerative anterolisthesis of C2 on C3. 4. Complete opacification of the right lung apex, suspect large pleural effusion. Electronically Signed   By: Jacqulynn Cadet M.D.   On: 07/02/2020 17:14   CT CHEST W CONTRAST  Result Date: 07/03/2020 CLINICAL DATA:  Pneumonia, effusion, or abscess suspected. Follow-up post thoracentesis is morning. EXAM: CT CHEST WITH CONTRAST TECHNIQUE: Multidetector CT imaging of the chest was performed during intravenous contrast administration. CONTRAST:  15mL OMNIPAQUE IOHEXOL 300 MG/ML  SOLN COMPARISON:  Chest x-ray July 03, 2020.  CT scan Nov 14, 2018. FINDINGS: Cardiovascular: Calcified atherosclerosis is seen in the LAD and right coronary arteries. The heart size is normal. Calcified atherosclerosis is seen in the nonaneurysmal thoracic aorta.  No aortic dissection identified. Central pulmonary arteries are normal in caliber. No central emboli are identified. Mediastinum/Nodes: There is a small right-sided pleural effusion. No left-sided pleural effusion or pericardial effusion. The esophagus and thyroid are normal. No adenopathy in the chest. Soft tissues of the chest wall are unremarkable. Lungs/Pleura: Central airways are normal. No pneumothorax. Infiltrate  with air bronchograms is seen in the right middle lobe. Patchy infiltrate is seen in the right lower lobe. No infiltrates are identified on the left. No suspicious masses or nodules. Upper Abdomen: There is a cyst in the left kidney. Ill-defined splenic defect on axial image 148 is favored to represent sequela of previous trauma or infarct. An acute subtle laceration without active extravasation is not completely excluded. No other abnormalities. Musculoskeletal: Age indeterminate fractures through the posterior right eleventh, tenth, and ninth ribs. These fractures are favored to be subacute rather than acute. Healed right eighth rib fracture. Healed right seventh rib fracture. IMPRESSION: 1. Right middle and lower lobe infiltrates worrisome for pneumonia or aspiration. There is a small to moderate associated effusion. Recommend clinical correlation and follow-up to resolution. 2. Multiple right-sided rib fractures are age indeterminate but favored to be subacute rather than acute. 3. Subtle defect in the spleen is favored to represent sequela of previous trauma or infarct. There is no adjacent fluid or active extravasation. A subtle acute laceration of the spleen is considered less likely. Recommend attention on follow-up. 4. No other acute abnormalities. These results will be called to the ordering clinician or representative by the Radiologist Assistant, and communication documented in the PACS or [employer]. Aortic Atherosclerosis (ICD10-I70.0). Electronically Signed   By: Dorise Bullion  III M.D   On: 07/03/2020 16:05   CT Cervical Spine Wo Contrast  Result Date: 07/02/2020 CLINICAL DATA:  87 year old male status post fall EXAM: CT HEAD WITHOUT CONTRAST CT CERVICAL SPINE WITHOUT CONTRAST TECHNIQUE: Multidetector CT imaging of the head and cervical spine was performed following the standard protocol without intravenous contrast. Multiplanar CT image reconstructions of the cervical spine were also generated. COMPARISON:  None. FINDINGS: CT HEAD FINDINGS Brain: No evidence of acute infarction, hemorrhage, hydrocephalus, extra-axial collection or mass lesion/mass effect. Very mild age related involutional changes including cerebral and cortical volume loss and minimal (for age) chronic microvascular ischemic white matter disease. Vascular: No hyperdense vessel or unexpected calcification. Skull: Normal. Negative for fracture or focal lesion. Sinuses/Orbits: No acute finding. Other: Small right frontal scalp hematoma with associated laceration. CT CERVICAL SPINE FINDINGS Alignment: Normal. Skull base and vertebrae: No acute fracture or malalignment. Soft tissues and spinal canal: No prevertebral fluid or swelling. No visible canal hematoma. Disc levels: Multilevel cervical spondylosis. Degenerative anterolisthesis of C2 on C3 measures approximately 4 mm. Multilevel posterior disc osteophyte complexes likely resulting in central stenosis. The level of most severe stenosis include C3-C4 and C6-C7. Upper chest: Complete opacification of the right lung apex. Suspect large effusion. Other: None. IMPRESSION: CT HEAD 1. No acute intracranial abnormality. 2. Right frontal scalp hematoma and laceration without evidence of underlying calvarial fracture. 3. Minimal age related involutional changes. CT CSPINE 1. No acute fracture or malalignment. 2. Multilevel cervical spondylosis with significant central canal stenosis at C3-C4 and C6-C7. 3. Degenerative anterolisthesis of C2 on C3. 4. Complete opacification of  the right lung apex, suspect large pleural effusion. Electronically Signed   By: Jacqulynn Cadet M.D.   On: 07/02/2020 17:14   DG Chest Port 1 View  Result Date: 07/03/2020 CLINICAL DATA:  Status post right thoracentesis. EXAM: PORTABLE CHEST 1 VIEW COMPARISON:  Yesterday. FINDINGS: Moderate-sized right pleural effusion, decreased. No pneumothorax. Normal sized heart. Acute and old right 8th rib fractures are again demonstrated. Thoracic spine degenerative changes. IMPRESSION: 1. Moderate-sized right pleural effusion, decreased. 2. No pneumothorax. 3. Acute and old right 8th rib fractures. Electronically Signed   By:  Claudie Revering M.D.   On: 07/03/2020 10:41   DG Hand Complete Right  Result Date: 07/02/2020 CLINICAL DATA:  Deformity of RIGHT fourth and fifth finger. EXAM: RIGHT HAND - COMPLETE 3+ VIEW COMPARISON:  None FINDINGS: Dislocation of the interphalangeal joint between proximal and middle phalanx of the small finger of the RIGHT hand with ulnar displacement and dorsal displacement of the distal finger. There is over riding of the displaced middle phalanx extending approximately 6 mm proximal along the dorsal aspect of the proximal phalanx. Deformity of the RIGHT first digit/thumb with what is likely chronic deformity though could represent injury to the interphalangeal joint due to hyper extension at this joint. No visible fracture in this location though surrounding degenerative changes makes assessment difficult. No visible fracture. Extensive arthritic changes throughout the hand involving first Pollard, first metacarpal phalangeal and all interphalangeal joints. No radiopaque foreign Pena in the soft tissues. IMPRESSION: Dislocation of the interphalangeal joint of the small finger/fifth digit of the RIGHT hand as described. Suspect chronic deformity of the RIGHT thumb due to arthritic changes. Correlate with any pain in this area with dedicated radiography as warranted. Electronically Signed   By:  Zetta Bills M.D.   On: 07/02/2020 17:33   US THORACENTESIS ASP PLEURAL SPACE W/IMG GUIDE  Result Date: 07/03/2020 INDICATION: Patient with a medical history significant for squamous carcinoma on his right back presents today with suspected malignant pleural effusion. Interventional radiology asked to perform a therapeutic and diagnostic thoracentesis. EXAM: ULTRASOUND GUIDED THORACENTESIS MEDICATIONS: 1% lidocaine 10 mL COMPLICATIONS: None immediate. PROCEDURE: An ultrasound guided thoracentesis was thoroughly discussed with the patient and questions answered. The benefits, risks, alternatives and complications were also discussed. The patient understands and wishes to proceed with the procedure. Written consent was obtained. Ultrasound was performed to localize and mark an adequate pocket of fluid in the right chest. The area was then prepped and draped in the normal sterile fashion. 1% Lidocaine was used for local anesthesia. Under ultrasound guidance a 6 Fr Safe-T-Centesis catheter was introduced. Thoracentesis was performed. The catheter was removed and a dressing applied. FINDINGS: A total of approximately 2 L of amber-colored fluid was removed. Samples were sent to the laboratory as requested by the clinical team. IMPRESSION: Successful ultrasound guided right thoracentesis yielding 2 L of pleural fluid. Read by: Soyla Dryer, NP Electronically Signed   By: Aletta Edouard M.D.   On: 07/03/2020 11:01    Labs: BNP (last 3 results) Recent Labs    07/02/20 1830  BNP [id number]   Basic Metabolic Panel: Recent Labs  Lab 07/02/20 1830  NA 133*  K 3.5  CL 92*  CO2 31  GLUCOSE 157*  BUN 12  CREATININE 0.86  CALCIUM 8.7*   Liver Function Tests: Recent Labs  Lab 07/02/20 1830  AST 19  ALT 10  ALKPHOS 91  BILITOT 0.6  PROT 7.5  ALBUMIN 3.6   No results for input(s): LIPASE, AMYLASE in the last 168 hours. No results for input(s): AMMONIA in the last 168 hours. CBC: Recent Labs  Lab  07/02/20 1830 07/03/20 0511  WBC 12.5* 10.9*  NEUTROABS 9.7*  --   HGB 12.0* 13.1  HCT 36.3* 38.3*  MCV 88.8 87.2  PLT 264 265   Cardiac Enzymes: No results for input(s): CKTOTAL, CKMB, CKMBINDEX, TROPONINI in the last 168 hours. BNP: Invalid input(s): POCBNP CBG: Recent Labs  Lab 07/04/20 1258  GLUCAP 196*   D-Dimer No results for input(s): DDIMER in the last 72  hours. Hgb A1c No results for input(s): HGBA1C in the last 72 hours. Lipid Profile No results for input(s): CHOL, HDL, LDLCALC, TRIG, CHOLHDL, LDLDIRECT in the last 72 hours. Thyroid function studies No results for input(s): TSH, T4TOTAL, T3FREE, THYROIDAB in the last 72 hours.  Invalid input(s): FREET3 Anemia work up No results for input(s): VITAMINB12, FOLATE, FERRITIN, TIBC, IRON, RETICCTPCT in the last 72 hours. Urinalysis    Component Value Date/Time   COLORURINE YELLOW (A) 07/02/2020 2140   APPEARANCEUR HAZY (A) 07/02/2020 2140   LABSPEC 1.014 07/02/2020 2140   PHURINE 5.0 07/02/2020 2140   GLUCOSEU NEGATIVE 07/02/2020 2140   HGBUR NEGATIVE 07/02/2020 2140   BILIRUBINUR NEGATIVE 07/02/2020 2140   KETONESUR 5 (A) 07/02/2020 2140   PROTEINUR NEGATIVE 07/02/2020 2140   NITRITE NEGATIVE 07/02/2020 2140   LEUKOCYTESUR LARGE (A) 07/02/2020 2140   Sepsis Labs Invalid input(s): PROCALCITONIN,  WBC,  LACTICIDVEN Microbiology Recent Results (from the past 240 hour(s))  Urine culture     Status: Abnormal   Collection Time: 07/02/20  9:40 PM   Specimen: Urine, Random  Result Value Ref Range Status   Specimen Description   Final    URINE, RANDOM Performed at [facility], [address], [place], [place] [address]    Special Requests   Final    NONE Performed at [facility], [address] [place], [place] [address]    Culture (A)  Final    <10,000 COLONIES/mL INSIGNIFICANT GROWTH Performed at [facility], [place] [address], [place], [place] [address]    Report Status  07/04/2020 FINAL  Final  Resp Panel by RT-PCR (Flu A&B, Covid)     Status: None   Collection Time: 07/02/20 10:20 PM  Result Value Ref Range Status   SARS Coronavirus 2 by RT PCR NEGATIVE NEGATIVE Final    Comment: (NOTE) SARS-CoV-2 target nucleic acids are NOT DETECTED.  The SARS-CoV-2 RNA is generally detectable in upper respiratory specimens during the acute phase of infection. The lowest concentration of SARS-CoV-2 viral copies this assay can detect is 138 copies/mL. A negative result does not preclude SARS-Cov-2 infection and should not be used as the sole basis for treatment or other patient management decisions. A negative result may occur with  improper specimen collection/handling, submission of specimen other than nasopharyngeal swab, presence of viral mutation(s) within the areas targeted by this assay, and inadequate number of viral copies(<138 copies/mL). A negative result must be combined with clinical observations, patient history, and epidemiological information. The expected result is Negative.  Fact Sheet for Patients:  EntrepreneurPulse.com.au  Fact Sheet for Healthcare Providers:  IncredibleEmployment.be  This test is no t yet approved or cleared by the [place] FDA and  has been authorized for detection and/or diagnosis of SARS-CoV-2 by FDA under an Emergency Use Authorization (EUA). This EUA will remain  in effect (meaning this test can be used) for the duration of the COVID-19 declaration under Section 564(b)(1) of the Act, 21 U.S.C.section 360bbb-3(b)(1), unless the authorization is terminated  or revoked sooner.       Influenza A by PCR NEGATIVE NEGATIVE Final   Influenza B by PCR NEGATIVE NEGATIVE Final    Comment: (NOTE) The Xpert Xpress SARS-CoV-2/FLU/RSV plus assay is intended as an aid in the diagnosis of influenza from Nasopharyngeal swab specimens and should not be used as a sole basis for treatment.  Nasal washings and aspirates are unacceptable for Xpert Xpress SARS-CoV-2/FLU/RSV testing.  Fact Sheet for Patients: EntrepreneurPulse.com.au  Fact Sheet for Healthcare Providers: IncredibleEmployment.be  This test is not yet approved or cleared by the [place] FDA and has been authorized for detection and/or diagnosis of SARS-CoV-2 by FDA under an Emergency Use Authorization (EUA). This EUA will remain in effect (meaning this test can be used) for the duration of the COVID-19 declaration under Section 564(b)(1) of the Act, 21 U.S.C. section 360bbb-3(b)(1), unless the authorization is terminated or revoked.  Performed at [facility], [address] [place], [place] [address]   [place]ena fluid culture     Status: None (Preliminary result)   Collection Time: 07/03/20 10:15 AM   Specimen: PATH Cytology Pleural fluid  Result Value Ref Range Status   Specimen Description   Final    PLEURAL Performed at [facility], [address], [place], [place] [address]    Special Requests   Final    NONE Performed at [facility], [place], [place] [address]    Gram Stain NO WBC SEEN NO ORGANISMS SEEN   Final   Culture   Final    NO GROWTH < 24 HOURS Performed at [facility], [place] [address], [place], [place] [address]    Report Status PENDING  Incomplete     Time coordinating discharge: 25 minutes  SIGNED: Antonieta Pert, MD  [facility] 07/04/2020, 1:24 PM  If 7PM-7AM, please contact night-coverage [email or web address]

## 2020-07-04 NOTE — Progress Notes (Signed)
Discharge order received. Patient mental status is at baseline. Vital signs stable . No signs of acute distress. Discharge instructions given. Patient verbalized understanding. No other issues noted at this time.   

## 2020-07-06 LAB — BODY FLUID CULTURE
Culture: NO GROWTH
Gram Stain: NONE SEEN

## 2020-07-06 LAB — CYTOLOGY - NON PAP

## 2020-08-03 DIAGNOSIS — F0280 Dementia in other diseases classified elsewhere without behavioral disturbance: Secondary | ICD-10-CM | POA: Insufficient documentation

## 2020-08-07 ENCOUNTER — Other Ambulatory Visit: Payer: Self-pay | Admitting: Pulmonary Disease

## 2020-08-07 ENCOUNTER — Ambulatory Visit
Admission: RE | Admit: 2020-08-07 | Discharge: 2020-08-07 | Disposition: A | Discharge status: Home / Self Care | Payer: Medicare Other | Source: Ambulatory Visit | Attending: Interventional Radiology | Admitting: Interventional Radiology

## 2020-08-07 ENCOUNTER — Other Ambulatory Visit: Payer: Self-pay | Admitting: Radiology

## 2020-08-07 ENCOUNTER — Other Ambulatory Visit: Payer: Self-pay

## 2020-08-07 ENCOUNTER — Ambulatory Visit
Admission: RE | Admit: 2020-08-07 | Discharge: 2020-08-07 | Disposition: A | Discharge status: Home / Self Care | Payer: Medicare Other | Source: Ambulatory Visit | Attending: Pulmonary Disease | Admitting: Pulmonary Disease

## 2020-08-07 ENCOUNTER — Other Ambulatory Visit
Admission: RE | Admit: 2020-08-07 | Discharge: 2020-08-07 | Disposition: A | Discharge status: Home / Self Care | Payer: Medicare Other | Source: Ambulatory Visit | Attending: Pulmonary Disease | Admitting: Pulmonary Disease

## 2020-08-07 DIAGNOSIS — J90 Pleural effusion, not elsewhere classified: Secondary | ICD-10-CM

## 2020-08-07 DIAGNOSIS — Z20822 Contact with and (suspected) exposure to covid-19: Secondary | ICD-10-CM | POA: Diagnosis not present

## 2020-08-07 DIAGNOSIS — Z9889 Other specified postprocedural states: Secondary | ICD-10-CM

## 2020-08-07 LAB — BODY FLUID CELL COUNT WITH DIFFERENTIAL
Eos, Fluid: 0 %
Lymphs, Fluid: 76 %
Monocyte-Macrophage-Serous Fluid: 16 %
Neutrophil Count, Fluid: 8 %
Total Nucleated Cell Count, Fluid: 504 cu mm

## 2020-08-07 LAB — AMYLASE, PLEURAL OR PERITONEAL FLUID: Amylase, Fluid: 35 U/L

## 2020-08-07 LAB — PROTEIN, PLEURAL OR PERITONEAL FLUID: Total protein, fluid: 4.5 g/dL

## 2020-08-07 LAB — GLUCOSE, PLEURAL OR PERITONEAL FLUID: Glucose, Fluid: 169 mg/dL

## 2020-08-07 LAB — LACTATE DEHYDROGENASE, PLEURAL OR PERITONEAL FLUID: LD, Fluid: 116 U/L — ABNORMAL HIGH (ref 3–23)

## 2020-08-07 LAB — SARS CORONAVIRUS 2 BY RT PCR (HOSPITAL ORDER, PERFORMED IN ~~LOC~~ HOSPITAL LAB): SARS Coronavirus 2: NEGATIVE

## 2020-08-07 NOTE — Procedures (Signed)
Pre procedural Dx: Symptomatic pleural effusion Post procedural Dx: Same  Successful US guided righ sided thoracentesis yielding 2.4 L of serous pleural fluid.   Samples sent to lab for analysis.  EBL: None Complications: None immediate.  Ronny Bacon, MD Pager #: [phone]

## 2020-08-08 LAB — ACID FAST SMEAR (AFB, MYCOBACTERIA): Acid Fast Smear: NEGATIVE

## 2020-08-10 LAB — BODY FLUID CULTURE: Culture: NO GROWTH

## 2020-08-10 LAB — PH, BODY FLUID: pH, Body Fluid: 7.6

## 2020-08-11 LAB — CYTOLOGY - NON PAP

## 2020-08-13 LAB — BODY FLUID CULTURE W GRAM STAIN: Culture: NO GROWTH

## 2020-09-07 LAB — FUNGUS CULTURE WITH STAIN

## 2020-09-07 LAB — FUNGUS CULTURE RESULT

## 2020-09-07 LAB — FUNGAL ORGANISM REFLEX

## 2020-09-21 LAB — ACID FAST CULTURE WITH REFLEXED SENSITIVITIES (MYCOBACTERIA): Acid Fast Culture: NEGATIVE

## 2020-11-02 DIAGNOSIS — Z8709 Personal history of other diseases of the respiratory system: Secondary | ICD-10-CM | POA: Insufficient documentation

## 2020-11-04 ENCOUNTER — Other Ambulatory Visit: Payer: Self-pay | Admitting: Pulmonary Disease

## 2020-11-04 DIAGNOSIS — J90 Pleural effusion, not elsewhere classified: Secondary | ICD-10-CM

## 2020-11-05 ENCOUNTER — Other Ambulatory Visit: Payer: Self-pay

## 2020-11-05 ENCOUNTER — Ambulatory Visit
Admission: RE | Admit: 2020-11-05 | Discharge: 2020-11-05 | Disposition: A | Discharge status: Home / Self Care | Payer: Medicare Other | Source: Ambulatory Visit | Attending: Student | Admitting: Student

## 2020-11-05 ENCOUNTER — Other Ambulatory Visit: Payer: Self-pay | Admitting: Student

## 2020-11-05 ENCOUNTER — Ambulatory Visit
Admission: RE | Admit: 2020-11-05 | Discharge: 2020-11-05 | Disposition: A | Discharge status: Home / Self Care | Payer: Medicare Other | Source: Ambulatory Visit | Attending: Pulmonary Disease | Admitting: Pulmonary Disease

## 2020-11-05 DIAGNOSIS — J90 Pleural effusion, not elsewhere classified: Secondary | ICD-10-CM | POA: Insufficient documentation

## 2020-11-05 DIAGNOSIS — Z9889 Other specified postprocedural states: Secondary | ICD-10-CM

## 2020-11-05 LAB — BODY FLUID CELL COUNT WITH DIFFERENTIAL
Eos, Fluid: 0 %
Lymphs, Fluid: 83 %
Monocyte-Macrophage-Serous Fluid: 17 %
Neutrophil Count, Fluid: 0 %
Total Nucleated Cell Count, Fluid: 1799 cu mm

## 2020-11-05 LAB — LACTATE DEHYDROGENASE, PLEURAL OR PERITONEAL FLUID: LD, Fluid: 119 U/L — ABNORMAL HIGH (ref 3–23)

## 2020-11-05 LAB — AMYLASE, PLEURAL OR PERITONEAL FLUID: Amylase, Fluid: 32 U/L

## 2020-11-05 LAB — PROTEIN, PLEURAL OR PERITONEAL FLUID: Total protein, fluid: 4.9 g/dL

## 2020-11-05 LAB — GLUCOSE, PLEURAL OR PERITONEAL FLUID: Glucose, Fluid: 160 mg/dL

## 2020-11-05 NOTE — Procedures (Signed)
PROCEDURE SUMMARY:  Successful US guided right thoracentesis. Yielded 1650 ml of amber-colored fluid. Pt tolerated procedure well. No immediate complications.  Specimen sent for labs. CXR ordered; no post-procedure pneumothorax identified  EBL < 2 mL  Theresa Duty, NP 11/05/2020 3:25 PM

## 2020-11-06 LAB — ACID FAST SMEAR (AFB, MYCOBACTERIA): Acid Fast Smear: NEGATIVE

## 2020-11-08 LAB — CHOLESTEROL, BODY FLUID: Cholesterol, Fluid: 58 mg/dL

## 2020-11-09 LAB — BODY FLUID CULTURE W GRAM STAIN: Culture: NO GROWTH

## 2020-11-09 LAB — CYTOLOGY - NON PAP

## 2020-12-04 LAB — FUNGUS CULTURE RESULT

## 2020-12-04 LAB — FUNGAL ORGANISM REFLEX

## 2020-12-04 LAB — FUNGUS CULTURE WITH STAIN

## 2020-12-09 ENCOUNTER — Other Ambulatory Visit: Payer: Self-pay | Admitting: Family Medicine

## 2020-12-09 DIAGNOSIS — J90 Pleural effusion, not elsewhere classified: Secondary | ICD-10-CM

## 2020-12-10 ENCOUNTER — Other Ambulatory Visit: Payer: Self-pay

## 2020-12-10 ENCOUNTER — Ambulatory Visit
Admission: RE | Admit: 2020-12-10 | Discharge: 2020-12-10 | Disposition: A | Discharge status: Home / Self Care | Payer: Medicare Other | Source: Ambulatory Visit | Attending: Interventional Radiology | Admitting: Interventional Radiology

## 2020-12-10 ENCOUNTER — Ambulatory Visit
Admission: RE | Admit: 2020-12-10 | Discharge: 2020-12-10 | Disposition: A | Discharge status: Home / Self Care | Payer: Medicare Other | Source: Ambulatory Visit | Attending: Family Medicine | Admitting: Family Medicine

## 2020-12-10 ENCOUNTER — Other Ambulatory Visit: Payer: Self-pay | Admitting: Interventional Radiology

## 2020-12-10 DIAGNOSIS — Z9889 Other specified postprocedural states: Secondary | ICD-10-CM

## 2020-12-10 DIAGNOSIS — J90 Pleural effusion, not elsewhere classified: Secondary | ICD-10-CM

## 2020-12-10 LAB — LACTATE DEHYDROGENASE, PLEURAL OR PERITONEAL FLUID: LD, Fluid: 156 U/L — ABNORMAL HIGH (ref 3–23)

## 2020-12-10 LAB — BODY FLUID CELL COUNT WITH DIFFERENTIAL
Eos, Fluid: 0 %
Lymphs, Fluid: 86 %
Monocyte-Macrophage-Serous Fluid: 10 %
Neutrophil Count, Fluid: 3 %
Total Nucleated Cell Count, Fluid: 2743 cu mm

## 2020-12-10 LAB — PROTEIN, PLEURAL OR PERITONEAL FLUID: Total protein, fluid: 4.7 g/dL

## 2020-12-10 LAB — AMYLASE, PLEURAL OR PERITONEAL FLUID: Amylase, Fluid: 28 U/L

## 2020-12-10 LAB — GLUCOSE, PLEURAL OR PERITONEAL FLUID: Glucose, Fluid: 135 mg/dL

## 2020-12-11 LAB — PH, BODY FLUID: pH, Body Fluid: 7.5

## 2020-12-12 LAB — ACID FAST SMEAR (AFB, MYCOBACTERIA): Acid Fast Smear: NEGATIVE

## 2020-12-13 LAB — CHOLESTEROL, BODY FLUID: Cholesterol, Fluid: 67 mg/dL

## 2020-12-14 LAB — BODY FLUID CULTURE W GRAM STAIN
Culture: NO GROWTH
Gram Stain: NONE SEEN

## 2020-12-14 LAB — CYTOLOGY - NON PAP

## 2020-12-27 LAB — ACID FAST CULTURE WITH REFLEXED SENSITIVITIES (MYCOBACTERIA): Acid Fast Culture: NEGATIVE

## 2021-01-08 LAB — FUNGUS CULTURE WITH STAIN

## 2021-01-08 LAB — FUNGUS CULTURE RESULT

## 2021-01-08 LAB — FUNGAL ORGANISM REFLEX

## 2021-01-14 ENCOUNTER — Emergency Department: Payer: Medicare Other

## 2021-01-14 ENCOUNTER — Other Ambulatory Visit: Payer: Self-pay

## 2021-01-14 ENCOUNTER — Emergency Department
Admission: EM | Admit: 2021-01-14 | Discharge: 2021-01-15 | Disposition: A | Discharge status: Home / Self Care | Payer: Medicare Other | Attending: Emergency Medicine | Admitting: Emergency Medicine

## 2021-01-14 DIAGNOSIS — R42 Dizziness and giddiness: Secondary | ICD-10-CM | POA: Insufficient documentation

## 2021-01-14 DIAGNOSIS — I2510 Atherosclerotic heart disease of native coronary artery without angina pectoris: Secondary | ICD-10-CM | POA: Insufficient documentation

## 2021-01-14 DIAGNOSIS — R55 Syncope and collapse: Secondary | ICD-10-CM | POA: Diagnosis not present

## 2021-01-14 DIAGNOSIS — E119 Type 2 diabetes mellitus without complications: Secondary | ICD-10-CM | POA: Diagnosis not present

## 2021-01-14 DIAGNOSIS — Z85828 Personal history of other malignant neoplasm of skin: Secondary | ICD-10-CM | POA: Diagnosis not present

## 2021-01-14 DIAGNOSIS — Z96642 Presence of left artificial hip joint: Secondary | ICD-10-CM | POA: Diagnosis not present

## 2021-01-14 DIAGNOSIS — R0789 Other chest pain: Secondary | ICD-10-CM | POA: Diagnosis not present

## 2021-01-14 DIAGNOSIS — I10 Essential (primary) hypertension: Secondary | ICD-10-CM | POA: Diagnosis not present

## 2021-01-14 DIAGNOSIS — Z8551 Personal history of malignant neoplasm of bladder: Secondary | ICD-10-CM | POA: Insufficient documentation

## 2021-01-14 LAB — CBC WITH DIFFERENTIAL/PLATELET
Abs Immature Granulocytes: 0.02 10*3/uL (ref 0.00–0.07)
Basophils Absolute: 0.1 10*3/uL (ref 0.0–0.1)
Basophils Relative: 1 %
Eosinophils Absolute: 0.3 10*3/uL (ref 0.0–0.5)
Eosinophils Relative: 4 %
HCT: 39 % (ref 39.0–52.0)
Hemoglobin: 13.4 g/dL (ref 13.0–17.0)
Immature Granulocytes: 0 %
Lymphocytes Relative: 25 %
Lymphs Abs: 2.2 10*3/uL (ref 0.7–4.0)
MCH: 30.5 pg (ref 26.0–34.0)
MCHC: 34.4 g/dL (ref 30.0–36.0)
MCV: 88.6 fL (ref 80.0–100.0)
Monocytes Absolute: 0.7 10*3/uL (ref 0.1–1.0)
Monocytes Relative: 8 %
Neutro Abs: 5.4 10*3/uL (ref 1.7–7.7)
Neutrophils Relative %: 62 %
Platelets: 268 10*3/uL (ref 150–400)
RBC: 4.4 MIL/uL (ref 4.22–5.81)
RDW: 14.1 % (ref 11.5–15.5)
WBC: 8.7 10*3/uL (ref 4.0–10.5)
nRBC: 0 % (ref 0.0–0.2)

## 2021-01-14 LAB — BASIC METABOLIC PANEL
Anion gap: 8 (ref 5–15)
BUN: 25 mg/dL — ABNORMAL HIGH (ref 8–23)
CO2: 28 mmol/L (ref 22–32)
Calcium: 9.1 mg/dL (ref 8.9–10.3)
Chloride: 97 mmol/L — ABNORMAL LOW (ref 98–111)
Creatinine, Ser: 0.94 mg/dL (ref 0.61–1.24)
GFR, Estimated: >60 mL/min (ref >60)
Glucose, Bld: 166 mg/dL — ABNORMAL HIGH (ref 70–99)
Potassium: 3.8 mmol/L (ref 3.5–5.1)
Sodium: 133 mmol/L — ABNORMAL LOW (ref 135–145)

## 2021-01-14 LAB — TROPONIN I (HIGH SENSITIVITY): Troponin I (High Sensitivity): 5 ng/L (ref <18)

## 2021-01-14 MED ORDER — LACTATED RINGERS IV BOLUS
500.0000 mL | Freq: Once | INTRAVENOUS | Status: AC
  Administered 2021-01-14: 500 mL via INTRAVENOUS

## 2021-01-14 NOTE — ED Provider Notes (Signed)
-----------------------------------------   11:04 PM on 01/14/2021 -----------------------------------------  Assuming care from Dr. Charna Archer.  In short, Tyler Pena is a 87 y.o. male with a chief complaint of chest pressure.  Refer to the original H&P for additional details.  The current plan of care is to follow-up on second troponin and reassess the patient.  Patient very much wants to go home so I anticipate discharge unless the troponin has gone up significantly.    ----------------------------------------- 12:42 AM on 01/15/2021 -----------------------------------------  Patient feels well, has had no recurrence of near syncope nor chest pressure.  He is in good spirits and appears much younger than his chronological age.  Repeat troponin was normal.  He and his family member are very comfortable with the plan for discharge and outpatient follow-up.  I gave my usual and customary return precautions.   Hinda Kehr, MD 01/15/21 [phone]

## 2021-01-14 NOTE — ED Triage Notes (Signed)
C/o sudden onset left sided chest pressure with light headedness lasting approx 1 hour. Per EMS, pt's symptoms have resolved, but pt. States he "just doesn't feel quite right." Per EMS, pt. Was orthostatic

## 2021-01-14 NOTE — ED Notes (Signed)
ED Provider at bedside. 

## 2021-01-14 NOTE — ED Notes (Signed)
Pt. Sitting on bedside to use urinal. Denies lightheadedness, CP, or SOB.

## 2021-01-14 NOTE — ED Provider Notes (Signed)
[facility] Emergency Department Provider Note   ____________________________________________   Event Date/Time   First MD Initiated Contact with Patient 01/14/21 2114     (approximate)  I have reviewed the triage vital signs and the nursing notes.   HISTORY  Chief Complaint Chest Pain (C/o sudden onset left sided chest pressure with light headedness lasting approx 1 hour. Per EMS, pt's symptoms have resolved, but pt. States he "just doesn't feel quite right." Per EMS, pt. Was orthostatic) and Dizziness    HPI Tyler Pena is a 87 y.o. male with past medical history of hypertension, hyperlipidemia, diabetes, CAD, and GERD who presents to the ED complaining of near syncope.  Patient reports that he was sitting and watching TV at his nursing home when he started to feel lightheaded and like he was going to pass out.  This was associated with some pressure in his chest that lasted for about 30 minutes before resolving.  He continues to feel mildly lightheaded and like "something is off."  He denies any shortness of breath and states he has been feeling well recently with no fevers, cough, vomiting, or diarrhea.  EMS reported that patient was orthostatic at the time of their arrival.        Past Medical History:  Diagnosis Date   Arthritis    Bilateral renal cysts    Bladder cancer [facility])    urologist-  dr Karsten Ro   BPH (benign prostatic hyperplasia)    Coronary artery disease    hx PCI w/ stenting in 10/ 2014 in [place]--- recently moved from [place] jan 2018 has not established a cardiologist yet but does have pcp    Dyslipidemia    Essential hypertension    GERD (gastroesophageal reflux disease)    Hematuria    History of gastric ulcer    History of hiatal hernia    History of kidney stones    History of melanoma in situ    several Excision's in situ and malignant melanoma's--  last one 06/ 2017 top of ear excision Stage 1 w/ negative margins/   other have been right upper arm, shoulder, chest, face   History of MI (myocardial infarction)    10/ 2014-- s/p  PCI and stenting x2   History of small bowel obstruction    fall 2017  s/p  abdominal lysis adhesions  (prior hx bowel obstruction total 8 times)   Nocturia    S/P right coronary artery (RCA) stent placement    10/ 2014  x2   Type 2 diabetes, diet controlled ([place])    Wears glasses    Wears hearing aid    bilateral    Patient Active Problem List   Diagnosis Date Noted   Pleural effusion 07/02/2020   1st degree AV block 09/17/2018   Diabetes mellitus type 2, diet-controlled ([place]) 11/28/2016   SBO (small bowel obstruction) ([place]) 11/07/2016   Partial small bowel obstruction ([place]) 10/28/2016   Carcinoma ([place]) 10/12/2016   Chest pain, rule out acute myocardial infarction 10/07/2016   Bladder tumor 09/02/2016   Benign prostatic hyperplasia with weak urinary stream 08/18/2016   Coronary artery disease involving native coronary artery of native heart without angina pectoris 08/18/2016   Essential hypertension 08/18/2016   GERD without esophagitis 08/18/2016    Past Surgical History:  Procedure Laterality Date   ABDOMINAL HERNIA REPAIR  2012   ADDOMINAL LYSIS ADHESIONS  fall 2017   [place]  and Appendectomy   CORONARY ANGIOPLASTY WITH STENT PLACEMENT  04-17-2013  in [place]   x2 stents to [place] Bilateral [place] Left 2008   TRANSURETHRAL RESECTION OF BLADDER TUMOR  09/2006   LEIOMYOMA    TRANSURETHRAL RESECTION OF BLADDER TUMOR WITH MITOMYCIN-C N/A 09/02/2016   Procedure: TRANSURETHRAL RESECTION OF BLADDER TUMOR WITH MITOMYCIN-C;  Surgeon: Kathie Rhodes, MD;  Location: [facility];  Service: Urology;  Laterality: N/A;    Prior to Admission medications   Medication Sig Start Date End Date Taking? Authorizing Provider  acetaminophen (TYLENOL) 500 MG tablet Take by mouth.    [provider]  aspirin EC 81 MG tablet Take 81 mg by mouth daily. Patient not taking: Reported on 07/03/2020    [provider]  Black Pepper-Turmeric 3-500 MG CAPS Take 2 capsules by mouth daily.    [provider]  Chromium-Cinnamon (CINNAMON PLUS CHROMIUM PO) Take 1 tablet by mouth 2 (two) times daily. '1000mg'$  cinnamon/  '250mg'$  chromium    [provider]  finasteride (PROSCAR) 5 MG tablet Take 5 mg by mouth every evening.     [provider]  meloxicam (MOBIC) 15 MG tablet Take 15 mg by mouth daily as needed. 05/14/18   [provider]  metoprolol tartrate (LOPRESSOR) 25 MG tablet Take 50 mg by mouth every evening.  Patient not taking: Reported on 07/03/2020    [provider]  Multiple Vitamin (MULTIVITAMIN WITH MINERALS) TABS tablet Take 1 tablet by mouth daily.    [provider]  mupirocin cream (BACTROBAN) 2 % Apply to skin during each bandage change Patient not taking: Reported on 07/03/2020 10/02/19   Ralene Bathe, MD  Omega-3 Fatty Acids (SUPER OMEGA 3 EPA/DHA) 1000 MG CAPS Take 1 capsule by mouth daily. Omega 3 - DHA - EPA - Fish oil-- '1000mg'$ / '120mg'$ / '180mg'$  Patient not taking: Reported on 07/03/2020    [provider]  omeprazole (PRILOSEC) 20 MG capsule Take by mouth daily as needed.    [provider]  polyethylene glycol powder (GLYCOLAX/MIRALAX) 17 GM/SCOOP powder Take by mouth daily as needed.    [provider]  tamsulosin (FLOMAX) 0.4 MG CAPS capsule Take 0.4 mg by mouth every evening.     [provider]    Allergies Fenofibrate and Losartan  Family History  Problem Relation Age of Onset   Cancer Mother    CAD Father    CAD Sister    CAD Sister    CAD Sister     Social History Social History   Tobacco Use   Smoking status: Never   Smokeless tobacco: Never  Vaping Use   Vaping Use: Never used  Substance Use Topics   Alcohol use: Yes    Alcohol/week: 1.0 standard  drink    Types: 1 Glasses of wine per week    Comment: occasional   Drug use: No    Review of Systems  Constitutional: No fever/chills Eyes: No visual changes. ENT: No sore throat. Cardiovascular: Positive for chest pain.  Positive for lightheadedness and near syncope. Respiratory: Denies shortness of breath. Gastrointestinal: No abdominal pain.  No nausea, no vomiting.  No diarrhea.  No constipation. Genitourinary: Negative for dysuria. Musculoskeletal: Negative for back pain. Skin: Negative for rash. Neurological: Negative for headaches, focal weakness or numbness.  ____________________________________________   PHYSICAL EXAM:  VITAL SIGNS: ED Triage Vitals  Enc Vitals Group  BP      Pulse      Resp      Temp      Temp src      SpO2      Weight      Height      Head Circumference      Peak Flow      Pain Score      Pain Loc      Pain Edu?      Excl. in [place]?     Constitutional: Alert and oriented. Eyes: Conjunctivae are normal. Head: Atraumatic. Nose: No congestion/rhinnorhea. Mouth/Throat: Mucous membranes are moist. Neck: Normal ROM Cardiovascular: Normal rate, regular rhythm. Grossly normal heart sounds.  2+ radial pulses bilaterally. Respiratory: Normal respiratory effort.  No retractions. Lungs CTAB.  No chest wall tenderness to palpation. Gastrointestinal: Soft and nontender. No distention. Genitourinary: deferred Musculoskeletal: No lower extremity tenderness nor edema. Neurologic:  Normal speech and language. No gross focal neurologic deficits are appreciated. Skin:  Skin is warm, dry and intact. No rash noted. Psychiatric: Mood and affect are normal. Speech and behavior are normal.  ____________________________________________   LABS (all labs ordered are listed, but only abnormal results are displayed)  Labs Reviewed  BASIC METABOLIC PANEL - Abnormal; Notable for the following components:      Result Value   Sodium 133 (*)    Chloride 97  (*)    Glucose, Bld 166 (*)    BUN 25 (*)    All other components within normal limits  CBC WITH DIFFERENTIAL/PLATELET  TROPONIN I (HIGH SENSITIVITY)   ____________________________________________  EKG  ED ECG REPORT I, Blake Divine, the attending physician, personally viewed and interpreted this ECG.   Date: 01/14/2021  EKG Time: 21:17  Rate: 58  Rhythm: normal sinus rhythm  Axis: Normal  Intervals:first-degree A-V block   ST&T Change: None   PROCEDURES  Procedure(s) performed (including Critical Care):  Procedures   ____________________________________________   INITIAL IMPRESSION / ASSESSMENT AND PLAN / ED COURSE      87 year old male with past medical history of hypertension, hyperlipidemia, diabetes, CAD, and GERD who presents to the ED complaining of lightheadedness and near syncope while sitting and watching TV.  This was associated with pressure in his chest that has since resolved, EKG shows no evidence of arrhythmia or ischemia, does show first-degree heart block similar to previous.  We will observe on cardiac monitor and check labs including 2 sets of troponin.  Initial labs are remarkable for elevated BUN to creatinine ratio, dehydration as a potential cause of his near syncope.  Troponin within normal limits and remainder of labs are unremarkable.  Patient reports feeling much better following IV fluids, chest pain remains resolved.  Chest x-ray reviewed by me and shows small recurrent right-sided pleural effusion.  Patient has a long history of right-sided pleural effusions, has required thoracentesis in the past and regularly follows with his PCP for this problem.  He has no shortness of breath at this time and effusion may be followed up as an outpatient.  If repeat troponin is within normal limits, patient would be appropriate for discharge home with cardiology or PCP follow-up.  Patient turned over to oncoming provider pending repeat troponin.       ____________________________________________   FINAL CLINICAL IMPRESSION(S) / ED DIAGNOSES  Final diagnoses:  Near syncope  Atypical chest pain     ED Discharge Orders     None        Note:  This document was prepared using Dragon voice recognition software and may include unintentional dictation errors.    Blake Divine, MD 01/14/21 2308

## 2021-01-15 LAB — TROPONIN I (HIGH SENSITIVITY): Troponin I (High Sensitivity): 5 ng/L (ref <18)

## 2021-01-15 NOTE — Discharge Instructions (Addendum)
Your workup in the Emergency Department today was reassuring.  We did not find any specific abnormalities.  We recommend you drink plenty of fluids, take your regular medications and/or any new ones prescribed today, and follow up with the doctor(s) listed in these documents as recommended.  Return to the Emergency Department if you develop new or worsening symptoms that concern you.  

## 2021-04-21 ENCOUNTER — Other Ambulatory Visit: Payer: Self-pay | Admitting: Pulmonary Disease

## 2021-04-21 DIAGNOSIS — J90 Pleural effusion, not elsewhere classified: Secondary | ICD-10-CM

## 2021-04-22 ENCOUNTER — Ambulatory Visit
Admission: RE | Admit: 2021-04-22 | Discharge: 2021-04-22 | Disposition: A | Discharge status: Home / Self Care | Payer: Medicare Other | Source: Ambulatory Visit | Attending: Interventional Radiology | Admitting: Interventional Radiology

## 2021-04-22 ENCOUNTER — Other Ambulatory Visit: Payer: Self-pay | Admitting: Interventional Radiology

## 2021-04-22 ENCOUNTER — Ambulatory Visit
Admission: RE | Admit: 2021-04-22 | Discharge: 2021-04-22 | Disposition: A | Discharge status: Home / Self Care | Payer: Medicare Other | Source: Ambulatory Visit | Attending: Pulmonary Disease | Admitting: Pulmonary Disease

## 2021-04-22 DIAGNOSIS — Z9889 Other specified postprocedural states: Secondary | ICD-10-CM | POA: Insufficient documentation

## 2021-04-22 DIAGNOSIS — J90 Pleural effusion, not elsewhere classified: Secondary | ICD-10-CM | POA: Diagnosis not present

## 2021-04-22 LAB — BODY FLUID CELL COUNT WITH DIFFERENTIAL
Eos, Fluid: 0 %
Lymphs, Fluid: 88 %
Monocyte-Macrophage-Serous Fluid: 10 %
Neutrophil Count, Fluid: 2 %
Total Nucleated Cell Count, Fluid: 3397 cu mm

## 2021-04-22 LAB — LACTATE DEHYDROGENASE, PLEURAL OR PERITONEAL FLUID: LD, Fluid: 141 U/L — ABNORMAL HIGH (ref 3–23)

## 2021-04-22 LAB — AMYLASE, PLEURAL OR PERITONEAL FLUID: Amylase, Fluid: 20 U/L

## 2021-04-22 LAB — PROTEIN, PLEURAL OR PERITONEAL FLUID: Total protein, fluid: 5.2 g/dL

## 2021-04-22 LAB — GLUCOSE, PLEURAL OR PERITONEAL FLUID: Glucose, Fluid: 155 mg/dL

## 2021-04-23 LAB — ACID FAST CULTURE WITH REFLEXED SENSITIVITIES (MYCOBACTERIA): Acid Fast Culture: NEGATIVE

## 2021-04-23 LAB — ACID FAST SMEAR (AFB, MYCOBACTERIA): Acid Fast Smear: NEGATIVE

## 2021-04-26 LAB — CYTOLOGY - NON PAP

## 2021-04-26 LAB — BODY FLUID CULTURE W GRAM STAIN: Culture: NO GROWTH

## 2021-04-26 LAB — CHOLESTEROL, BODY FLUID: Cholesterol, Fluid: 61 mg/dL

## 2021-05-22 LAB — FUNGUS CULTURE WITH STAIN

## 2021-05-22 LAB — FUNGAL ORGANISM REFLEX

## 2021-05-22 LAB — FUNGUS CULTURE RESULT

## 2021-06-05 LAB — ACID FAST CULTURE WITH REFLEXED SENSITIVITIES (MYCOBACTERIA): Acid Fast Culture: NEGATIVE

## 2021-07-22 ENCOUNTER — Encounter: Payer: Self-pay | Admitting: Emergency Medicine

## 2021-07-22 ENCOUNTER — Other Ambulatory Visit: Payer: Self-pay

## 2021-07-22 ENCOUNTER — Emergency Department
Admission: EM | Admit: 2021-07-22 | Discharge: 2021-07-22 | Disposition: A | Discharge status: Home / Self Care | Payer: Medicare Other | Attending: Emergency Medicine | Admitting: Emergency Medicine

## 2021-07-22 ENCOUNTER — Emergency Department: Payer: Medicare Other

## 2021-07-22 DIAGNOSIS — S61511A Laceration without foreign body of right wrist, initial encounter: Secondary | ICD-10-CM | POA: Diagnosis not present

## 2021-07-22 DIAGNOSIS — S0083XA Contusion of other part of head, initial encounter: Secondary | ICD-10-CM | POA: Insufficient documentation

## 2021-07-22 DIAGNOSIS — S80211A Abrasion, right knee, initial encounter: Secondary | ICD-10-CM | POA: Insufficient documentation

## 2021-07-22 DIAGNOSIS — I10 Essential (primary) hypertension: Secondary | ICD-10-CM | POA: Insufficient documentation

## 2021-07-22 DIAGNOSIS — S6991XA Unspecified injury of right wrist, hand and finger(s), initial encounter: Secondary | ICD-10-CM | POA: Diagnosis present

## 2021-07-22 DIAGNOSIS — E119 Type 2 diabetes mellitus without complications: Secondary | ICD-10-CM | POA: Insufficient documentation

## 2021-07-22 DIAGNOSIS — W19XXXA Unspecified fall, initial encounter: Secondary | ICD-10-CM

## 2021-07-22 DIAGNOSIS — W1830XA Fall on same level, unspecified, initial encounter: Secondary | ICD-10-CM | POA: Insufficient documentation

## 2021-07-22 DIAGNOSIS — I2510 Atherosclerotic heart disease of native coronary artery without angina pectoris: Secondary | ICD-10-CM | POA: Diagnosis not present

## 2021-07-22 DIAGNOSIS — T148XXA Other injury of unspecified body region, initial encounter: Secondary | ICD-10-CM

## 2021-07-22 NOTE — ED Provider Notes (Signed)
[facility] Provider Note    Event Date/Time   First MD Initiated Contact with Patient 07/22/21 1635     (approximate)   History   Fall   HPI  Tyler Pena is a 88 y.o. male  with past medical history of hypertension, hyperlipidemia, diabetes, CAD, GERD and some baseline instability using a walker to get around who presents accompanied by family for assessment after a fall that occurred earlier today.  Patient states he lost his balance when he turned quickly.  This was not preceded by any chest pain, headedness, dizziness or any other sick symptoms.  He is otherwise been in his usual state of health without any recent fevers, chills, cough, vomiting, diarrhea, rash or any other recent injuries.  He is not any blood thinners.  He states he currently has no significant pain anywhere.  He denies any other acute concerns at this time.     Physical Exam  Triage Vital Signs: ED Triage Vitals  Enc Vitals Group     BP      Pulse      Resp      Temp      Temp src      SpO2      Weight      Height      Head Circumference      Peak Flow      Pain Score      Pain Loc      Pain Edu?      Excl. in [place]?     Most recent vital signs: Vitals:   07/22/21 1640  BP: (!) 155/99  Pulse: (!) 51  Resp: 17  Temp: 98 F (36.7 C)  SpO2: 98%    General: Awake, no distress.  CV:  Good peripheral perfusion.  Slight systolic murmur.  2+ bilateral radial and posterior tibialis pulses.  Patient's extremities are warm and well-perfused. Resp:  Normal effort.  Clear bilaterally Abd:  No distention.  Soft throughout. Other:  No tenderness step-offs or deformities over the C/T/L-spine.  There is an abrasion over the right forehead with underlying hematoma but no lacerations.  No other obvious trauma to the face scalp head or neck.  Patient has full strength throughout the bilateral upper and lower extremities.  There is a small skin tear over the dorsum of the right wrist  but no snuffbox tenderness the patient is symmetric grip strength is able to flex and extend all digits compared to the left.  Sensation is intact in the distribution of the radial ulnar and median nerves.  No effusion deformity or tenderness over the wrist, elbow or shoulders bilaterally.  There is abrasion and small skin tear over the right knee.  Patient otherwise has full range of motion and strength of the right knee.  He is otherwise neurovascular intact throughout the lower extremities without any tenderness over the hips knees or ankles.  He is denying any other acute concerns at this time.   ED Results / Procedures / Treatments  Labs (all labs ordered are listed, but only abnormal results are displayed) Labs Reviewed - No data to display   EKG   RADIOLOGY  CT head and C-spine ordered and reviewed by myself show no evidence of skull fracture, intracranial hemorrhage or acute C-spine fracture or traumatic subluxation.  There is evidence of a right frontal scalp hematoma and some stable atrophy and chronic small vessel ischemic changes of white matter on the CT head.  There is also evidence of degenerative changes on the CT C-spine and some dependent pleural effusion seen which is previously known.  Also reviewed radiology interpretation of the studies and agree with their findings.   PROCEDURES:  Critical Care performed: No  Procedures    MEDICATIONS ORDERED IN ED: Medications - No data to display   IMPRESSION / MDM / [place] / ED COURSE  I reviewed the triage vital signs and the nursing notes.                              Patient presents with above-stated history exam for evaluation after a fall described above that sounds fairly clearly mechanical.  He is denying any presyncopal symptoms seizure and had no loss of consciousness.  He states he has no pain on arrival and is declining any analgesia.  He has evidence of an abrasion on the hematoma right forehead  and some small skin tears abrasion of the right wrist and right anterior knee but no other evidence of trauma on exam.  He was otherwise neurologically intact.  Given his age did obtain a CT head and C-spine to rule out occult intracranial injury or acute C-spine injury.  His exam is otherwise not consistent with acute fracture at the wrist or at the right knee and I have a low suspicion for other significant occult visceral or significant orthopedic injury.  CT head and C-spine ordered and reviewed by myself show no evidence of skull fracture, intracranial hemorrhage or acute C-spine fracture or traumatic subluxation.  There is evidence of a right frontal scalp hematoma and some stable atrophy and chronic small vessel ischemic changes of white matter on the CT head.  There is also evidence of degenerative changes on the CT C-spine and some dependent pleural effusion seen which is previously known.  Also reviewed radiology interpretation of the studies and agree with their findings.  Low suspicion for other immediate life-threatening process as there is no evidence on history exam, CVA, acute infectious process and I have low sufficient for significant anemia or metabolic derangement.  I suspect likely superficial injuries based on reassuring CT.  I discussed this with patient and family bedside.  Patient states he wishes to get out soon as possible.  Wounds cleaned and dressed and on her dressings.  Discharged in stable condition.  Strict turn precautions advised and discussed.        FINAL CLINICAL IMPRESSION(S) / ED DIAGNOSES   Final diagnoses:  Fall, initial encounter  Traumatic hematoma of forehead, initial encounter  Multiple skin tears     Rx / DC Orders   ED Discharge Orders     None        Note:  This document was prepared using Dragon voice recognition software and may include unintentional dictation errors.   Lucrezia Starch, MD 07/22/21 [phone]

## 2021-07-22 NOTE — ED Notes (Signed)
Skin tears to right hand,knee and forehead cleansed with saline and surgical soap  adaptic and conform dressing applied

## 2021-07-22 NOTE — ED Triage Notes (Signed)
Pt comes into the ED via [facility] from [facility] independent living c/o mechanical fall.  Pt states his legs gave out on him, but denies any dizziness.  Pt did hit his head and has a hematoma and abrasion present on the right side of the forehead.  Pt also has abrasions on the right knee as well as skin tears on the right hand.  Denies any LOC or blood thinners.  Pt in NAD at this time with even and unlabored respirations.   148/80 110 Hr 97% RA 18 RR

## 2021-08-19 DIAGNOSIS — H903 Sensorineural hearing loss, bilateral: Secondary | ICD-10-CM | POA: Insufficient documentation

## 2021-08-19 DIAGNOSIS — C4492 Squamous cell carcinoma of skin, unspecified: Secondary | ICD-10-CM | POA: Insufficient documentation

## 2022-04-28 ENCOUNTER — Encounter: Payer: Self-pay | Admitting: Nurse Practitioner

## 2022-04-28 NOTE — Progress Notes (Signed)
err

## 2022-12-01 LAB — HEPATIC FUNCTION PANEL
ALT: 8 U/L — AB (ref 10–40)
AST: 12 — AB (ref 14–40)
Bilirubin, Total: 0.4

## 2022-12-01 LAB — COMPREHENSIVE METABOLIC PANEL WITH GFR
Albumin: 3.3 — AB (ref 3.5–5.0)
Calcium: 8.6 — AB (ref 8.7–10.7)
Globulin: 2.9
eGFR: 77

## 2022-12-01 LAB — CBC AND DIFFERENTIAL
HCT: 31 — AB (ref 41–53)
Hemoglobin: 9.8 — AB (ref 13.5–17.5)
Neutrophils Absolute: 3818
Platelets: 293 10*3/uL (ref 150–400)
WBC: 6.3

## 2022-12-01 LAB — LIPID PANEL
Cholesterol: 151 (ref 0–200)
HDL: 30 — AB (ref 35–70)
LDL Cholesterol: 88
Triglycerides: 254 — AB (ref 40–160)

## 2022-12-01 LAB — BASIC METABOLIC PANEL WITH GFR
BUN: 16 (ref 4–21)
CO2: 26 — AB (ref 13–22)
Chloride: 100 (ref 99–108)
Creatinine: 0.9 (ref 0.6–1.3)
Glucose: 182
Potassium: 4 meq/L (ref 3.5–5.1)
Sodium: 134 — AB (ref 137–147)

## 2022-12-01 LAB — CBC: RBC: 3.9 (ref 3.87–5.11)

## 2022-12-01 LAB — HEMOGLOBIN A1C: Hemoglobin A1C: 6.9

## 2022-12-26 ENCOUNTER — Inpatient Hospital Stay
Admission: EM | Admit: 2022-12-26 | Discharge: 2022-12-30 | DRG: 389 | Disposition: A | Discharge status: Nursing Home (Includes ICF) | Payer: Medicare Other | Source: Skilled Nursing Facility | Attending: Student | Admitting: Student

## 2022-12-26 ENCOUNTER — Inpatient Hospital Stay: Payer: Medicare Other

## 2022-12-26 ENCOUNTER — Emergency Department: Payer: Medicare Other

## 2022-12-26 ENCOUNTER — Other Ambulatory Visit: Payer: Self-pay

## 2022-12-26 DIAGNOSIS — K5669 Other partial intestinal obstruction: Secondary | ICD-10-CM | POA: Diagnosis not present

## 2022-12-26 DIAGNOSIS — K219 Gastro-esophageal reflux disease without esophagitis: Secondary | ICD-10-CM | POA: Diagnosis present

## 2022-12-26 DIAGNOSIS — Z7982 Long term (current) use of aspirin: Secondary | ICD-10-CM

## 2022-12-26 DIAGNOSIS — Z955 Presence of coronary angioplasty implant and graft: Secondary | ICD-10-CM

## 2022-12-26 DIAGNOSIS — F0280 Dementia in other diseases classified elsewhere without behavioral disturbance: Secondary | ICD-10-CM | POA: Diagnosis present

## 2022-12-26 DIAGNOSIS — K56609 Unspecified intestinal obstruction, unspecified as to partial versus complete obstruction: Secondary | ICD-10-CM | POA: Diagnosis present

## 2022-12-26 DIAGNOSIS — I2510 Atherosclerotic heart disease of native coronary artery without angina pectoris: Secondary | ICD-10-CM | POA: Diagnosis present

## 2022-12-26 DIAGNOSIS — E871 Hypo-osmolality and hyponatremia: Secondary | ICD-10-CM | POA: Diagnosis present

## 2022-12-26 DIAGNOSIS — G3183 Dementia with Lewy bodies: Secondary | ICD-10-CM | POA: Diagnosis present

## 2022-12-26 DIAGNOSIS — Z8249 Family history of ischemic heart disease and other diseases of the circulatory system: Secondary | ICD-10-CM | POA: Diagnosis not present

## 2022-12-26 DIAGNOSIS — E785 Hyperlipidemia, unspecified: Secondary | ICD-10-CM | POA: Diagnosis present

## 2022-12-26 DIAGNOSIS — Z66 Do not resuscitate: Secondary | ICD-10-CM | POA: Diagnosis present

## 2022-12-26 DIAGNOSIS — I252 Old myocardial infarction: Secondary | ICD-10-CM

## 2022-12-26 DIAGNOSIS — Z86006 Personal history of melanoma in-situ: Secondary | ICD-10-CM | POA: Diagnosis not present

## 2022-12-26 DIAGNOSIS — D509 Iron deficiency anemia, unspecified: Secondary | ICD-10-CM | POA: Diagnosis present

## 2022-12-26 DIAGNOSIS — K5651 Intestinal adhesions [bands], with partial obstruction: Secondary | ICD-10-CM | POA: Diagnosis present

## 2022-12-26 DIAGNOSIS — E119 Type 2 diabetes mellitus without complications: Secondary | ICD-10-CM | POA: Diagnosis present

## 2022-12-26 DIAGNOSIS — K439 Ventral hernia without obstruction or gangrene: Secondary | ICD-10-CM | POA: Diagnosis present

## 2022-12-26 DIAGNOSIS — E639 Nutritional deficiency, unspecified: Secondary | ICD-10-CM | POA: Diagnosis present

## 2022-12-26 DIAGNOSIS — Z79899 Other long term (current) drug therapy: Secondary | ICD-10-CM | POA: Diagnosis not present

## 2022-12-26 DIAGNOSIS — Z809 Family history of malignant neoplasm, unspecified: Secondary | ICD-10-CM | POA: Diagnosis not present

## 2022-12-26 DIAGNOSIS — I10 Essential (primary) hypertension: Secondary | ICD-10-CM | POA: Diagnosis present

## 2022-12-26 DIAGNOSIS — Z888 Allergy status to other drugs, medicaments and biological substances status: Secondary | ICD-10-CM

## 2022-12-26 DIAGNOSIS — Z8551 Personal history of malignant neoplasm of bladder: Secondary | ICD-10-CM | POA: Diagnosis not present

## 2022-12-26 DIAGNOSIS — N400 Enlarged prostate without lower urinary tract symptoms: Secondary | ICD-10-CM | POA: Diagnosis present

## 2022-12-26 LAB — CBC WITH DIFFERENTIAL/PLATELET
Abs Immature Granulocytes: 0.04 10*3/uL (ref 0.00–0.07)
Basophils Absolute: 0 10*3/uL (ref 0.0–0.1)
Basophils Relative: 0 %
Eosinophils Absolute: 0.2 10*3/uL (ref 0.0–0.5)
Eosinophils Relative: 1 %
HCT: 36.3 % — ABNORMAL LOW (ref 39.0–52.0)
Hemoglobin: 11.4 g/dL — ABNORMAL LOW (ref 13.0–17.0)
Immature Granulocytes: 0 %
Lymphocytes Relative: 22 %
Lymphs Abs: 2.5 10*3/uL (ref 0.7–4.0)
MCH: 24.8 pg — ABNORMAL LOW (ref 26.0–34.0)
MCHC: 31.4 g/dL (ref 30.0–36.0)
MCV: 78.9 fL — ABNORMAL LOW (ref 80.0–100.0)
Monocytes Absolute: 0.8 10*3/uL (ref 0.1–1.0)
Monocytes Relative: 7 %
Neutro Abs: 7.9 10*3/uL — ABNORMAL HIGH (ref 1.7–7.7)
Neutrophils Relative %: 70 %
Platelets: 337 10*3/uL (ref 150–400)
RBC: 4.6 MIL/uL (ref 4.22–5.81)
RDW: 15.8 % — ABNORMAL HIGH (ref 11.5–15.5)
WBC: 11.4 10*3/uL — ABNORMAL HIGH (ref 4.0–10.5)
nRBC: 0 % (ref 0.0–0.2)

## 2022-12-26 LAB — COMPREHENSIVE METABOLIC PANEL
ALT: 13 U/L (ref 0–44)
AST: 18 U/L (ref 15–41)
Albumin: 3.3 g/dL — ABNORMAL LOW (ref 3.5–5.0)
Alkaline Phosphatase: 63 U/L (ref 38–126)
Anion gap: 9 (ref 5–15)
BUN: 15 mg/dL (ref 8–23)
CO2: 25 mmol/L (ref 22–32)
Calcium: 9 mg/dL (ref 8.9–10.3)
Chloride: 99 mmol/L (ref 98–111)
Creatinine, Ser: 0.97 mg/dL (ref 0.61–1.24)
GFR, Estimated: >60 mL/min (ref >60)
Glucose, Bld: 179 mg/dL — ABNORMAL HIGH (ref 70–99)
Potassium: 4.1 mmol/L (ref 3.5–5.1)
Sodium: 133 mmol/L — ABNORMAL LOW (ref 135–145)
Total Bilirubin: 0.4 mg/dL (ref 0.3–1.2)
Total Protein: 6.7 g/dL (ref 6.5–8.1)

## 2022-12-26 LAB — LACTIC ACID, PLASMA: Lactic Acid, Venous: 1.3 mmol/L (ref 0.5–1.9)

## 2022-12-26 LAB — LIPASE, BLOOD: Lipase: 27 U/L (ref 11–51)

## 2022-12-26 MED ORDER — PANTOPRAZOLE SODIUM 40 MG IV SOLR
40.0000 mg | Freq: Two times a day (BID) | INTRAVENOUS | Status: DC
  Administered 2022-12-26 – 2022-12-28 (×5): 40 mg via INTRAVENOUS
  Filled 2022-12-26 (×5): qty 10

## 2022-12-26 MED ORDER — ADULT MULTIVITAMIN W/MINERALS CH
1.0000 | ORAL_TABLET | Freq: Every day | ORAL | Status: DC
  Administered 2022-12-26 – 2022-12-30 (×5): 1 via ORAL
  Filled 2022-12-26 (×5): qty 1

## 2022-12-26 MED ORDER — ACETAMINOPHEN 650 MG RE SUPP: 650.0000 mg | Freq: Four times a day (QID) | RECTAL | Status: DC | PRN

## 2022-12-26 MED ORDER — ONDANSETRON HCL 4 MG PO TABS: 4.0000 mg | ORAL_TABLET | Freq: Four times a day (QID) | ORAL | Status: DC | PRN

## 2022-12-26 MED ORDER — TAMSULOSIN HCL 0.4 MG PO CAPS
0.4000 mg | ORAL_CAPSULE | Freq: Every evening | ORAL | Status: DC
  Administered 2022-12-26 – 2022-12-29 (×4): 0.4 mg via ORAL
  Filled 2022-12-26 (×4): qty 1

## 2022-12-26 MED ORDER — TRAZODONE HCL 50 MG PO TABS
25.0000 mg | ORAL_TABLET | Freq: Every evening | ORAL | Status: DC | PRN
  Administered 2022-12-26: 25 mg via ORAL
  Filled 2022-12-26: qty 1

## 2022-12-26 MED ORDER — ONDANSETRON HCL 4 MG/2ML IJ SOLN
4.0000 mg | INTRAMUSCULAR | Status: AC
  Administered 2022-12-26: 4 mg via INTRAVENOUS
  Filled 2022-12-26: qty 2

## 2022-12-26 MED ORDER — DROPERIDOL 2.5 MG/ML IJ SOLN
1.2500 mg | Freq: Once | INTRAMUSCULAR | Status: AC
  Administered 2022-12-26: 1.25 mg via INTRAVENOUS
  Filled 2022-12-26: qty 2

## 2022-12-26 MED ORDER — IOHEXOL 300 MG/ML  SOLN
100.0000 mL | Freq: Once | INTRAMUSCULAR | Status: AC | PRN
  Administered 2022-12-26: 100 mL via INTRAVENOUS

## 2022-12-26 MED ORDER — ONDANSETRON HCL 4 MG/2ML IJ SOLN: 4.0000 mg | Freq: Four times a day (QID) | INTRAMUSCULAR | Status: DC | PRN

## 2022-12-26 MED ORDER — HYDRALAZINE HCL 20 MG/ML IJ SOLN: 10.0000 mg | Freq: Four times a day (QID) | INTRAMUSCULAR | Status: DC | PRN

## 2022-12-26 MED ORDER — FINASTERIDE 5 MG PO TABS
5.0000 mg | ORAL_TABLET | Freq: Every evening | ORAL | Status: DC
  Administered 2022-12-26 – 2022-12-29 (×4): 5 mg via ORAL
  Filled 2022-12-26 (×4): qty 1

## 2022-12-26 MED ORDER — METOPROLOL TARTRATE 50 MG PO TABS
50.0000 mg | ORAL_TABLET | Freq: Every evening | ORAL | Status: DC
  Administered 2022-12-26: 50 mg via ORAL
  Filled 2022-12-26: qty 1

## 2022-12-26 MED ORDER — SODIUM CHLORIDE 0.9 % IV SOLN: INTRAVENOUS | Status: DC

## 2022-12-26 MED ORDER — RIVASTIGMINE TARTRATE 1.5 MG PO CAPS
1.5000 mg | ORAL_CAPSULE | Freq: Two times a day (BID) | ORAL | Status: DC
  Administered 2022-12-26 – 2022-12-30 (×9): 1.5 mg via ORAL
  Filled 2022-12-26 (×9): qty 1

## 2022-12-26 MED ORDER — SODIUM CHLORIDE 0.9 % IV SOLN: Freq: Once | INTRAVENOUS | Status: DC

## 2022-12-26 MED ORDER — ACETAMINOPHEN 325 MG PO TABS: 650.0000 mg | ORAL_TABLET | Freq: Four times a day (QID) | ORAL | Status: DC | PRN

## 2022-12-26 MED ORDER — ENOXAPARIN SODIUM 40 MG/0.4ML IJ SOSY
40.0000 mg | PREFILLED_SYRINGE | INTRAMUSCULAR | Status: DC
  Administered 2022-12-26: 40 mg via SUBCUTANEOUS
  Filled 2022-12-26 (×3): qty 0.4

## 2022-12-26 MED ORDER — SPIRONOLACTONE 25 MG PO TABS
25.0000 mg | ORAL_TABLET | Freq: Every day | ORAL | Status: DC
  Administered 2022-12-26 – 2022-12-30 (×5): 25 mg via ORAL
  Filled 2022-12-26 (×5): qty 1

## 2022-12-26 NOTE — Consult Note (Signed)
[facility] SURGICAL CONSULTATION NOTE (initial) - cpt: 16109   HISTORY OF PRESENT ILLNESS (HPI):  89 y.o. male presented to [facility] ED overnight for evaluation of abdominal pain. Patient reports <12 hour history of abdominal distension and generalized pain. This started initially after he ate dinner around 6 PM last night. He had no relief from the symptoms since the onset. No reported fever, chills, CP, SOB, nausea, emesis, or urinary changes. He did report continued bowel function despite these symptoms. Of note, he has a significant history of previous partial SBO in the past. Last seen by our service in 2018 for this. He does report a significant abdominal surgical history including open cholecystectomy and appendectomy, subsequent hernia repair with mesh, and attempted LOA at some point. He was told after the LOA in 2017 that "his adhesions were tremendous" and the next time he needed surgery it would need to be open fashion. Work up in the ED revealed mild leukocytosis to 11.4K, Hgb to 11.4, renal function normal with sCr - 0.97, m,ild hyponatremia to 133, and venous lactate normal at 1.3. CT Abdomen/pelvis was obtained and concerning for possible SBO with transition in RLQ. He was admitted to the medicine service. NGT in place.   Surgery is consulted by emergency medicine physician Dr. Loleta Rose, MD in this context for evaluation and management of SBO.   PAST MEDICAL HISTORY (PMH):  Past Medical History:  Diagnosis Date   Arthritis    Bilateral renal cysts    Bladder cancer [facility])    urologist-  dr Vernie Ammons   BPH (benign prostatic hyperplasia)    Coronary artery disease    hx PCI w/ stenting in 10/ 2014 in [place]--- recently moved from [place] jan 2018 has not established a cardiologist yet but does have pcp    Dyslipidemia    Essential hypertension    GERD (gastroesophageal reflux disease)    Hematuria    History of gastric ulcer    History of hiatal hernia     History of kidney stones    History of melanoma in situ    several Excision's in situ and malignant melanoma's--  last one 06/ 2017 top of ear excision Stage 1 w/ negative margins/  other have been right upper arm, shoulder, chest, face   History of MI (myocardial infarction)    10/ 2014-- s/p  PCI and stenting x2   History of small bowel obstruction    fall 2017  s/p  abdominal lysis adhesions  (prior hx bowel obstruction total 8 times)   Nocturia    S/P right coronary artery (RCA) stent placement    10/ 2014  x2   Type 2 diabetes, diet controlled (HCC)    Wears glasses    Wears hearing aid    bilateral     PAST SURGICAL HISTORY (PSH):  Past Surgical History:  Procedure Laterality Date   ABDOMINAL HERNIA REPAIR  2012   ADDOMINAL LYSIS ADHESIONS  fall 2017   sbo   CHOLECYSTECTOMY OPEN  1971   and Appendectomy   CORONARY ANGIOPLASTY WITH STENT PLACEMENT  04-17-2013  in [place]   x2 stents to RCA   INGUINAL HERNIA REPAIR Bilateral 1995   TOTAL HIP ARTHROPLASTY Left 2008   TRANSURETHRAL RESECTION OF BLADDER TUMOR  09/2006   LEIOMYOMA    TRANSURETHRAL RESECTION OF BLADDER TUMOR WITH MITOMYCIN-C N/A 09/02/2016   Procedure: TRANSURETHRAL RESECTION OF BLADDER TUMOR WITH MITOMYCIN-C;  Surgeon: Ihor Gully, MD;  Location: [facility];  Service: Urology;  Laterality: N/A;     MEDICATIONS:  Prior to Admission medications   Medication Sig Start Date End Date Taking? Authorizing Provider  acetaminophen (TYLENOL) 500 MG tablet Take by mouth.    [provider]  aspirin EC 81 MG tablet Take 81 mg by mouth daily. Patient not taking: Reported on 07/03/2020    [provider]  Black Pepper-Turmeric 3-500 MG CAPS Take 2 capsules by mouth daily.    [provider]  Chromium-Cinnamon (CINNAMON PLUS CHROMIUM PO) Take 1 tablet by mouth 2 (two) times daily. 1000mg  cinnamon/  250mg  chromium    [provider]  finasteride (PROSCAR) 5 MG tablet Take  5 mg by mouth every evening.     [provider]  meloxicam (MOBIC) 15 MG tablet Take 15 mg by mouth daily as needed. 05/14/18   [provider]  metoprolol tartrate (LOPRESSOR) 25 MG tablet Take 50 mg by mouth every evening.  Patient not taking: Reported on 07/03/2020    [provider]  Multiple Vitamin (MULTIVITAMIN WITH MINERALS) TABS tablet Take 1 tablet by mouth daily.    [provider]  mupirocin cream (BACTROBAN) 2 % Apply to skin during each bandage change Patient not taking: Reported on 07/03/2020 10/02/19   Deirdre Evener, MD  Omega-3 Fatty Acids (SUPER OMEGA 3 EPA/DHA) 1000 MG CAPS Take 1 capsule by mouth daily. Omega 3 - DHA - EPA - Fish oil-- 1000mg / 120mg / 180mg  Patient not taking: Reported on 07/03/2020    [provider]  omeprazole (PRILOSEC) 20 MG capsule Take by mouth daily as needed.    [provider]  polyethylene glycol powder (GLYCOLAX/MIRALAX) 17 GM/SCOOP powder Take by mouth daily as needed.    [provider]  tamsulosin (FLOMAX) 0.4 MG CAPS capsule Take 0.4 mg by mouth every evening.     [provider]     ALLERGIES:  Allergies  Allergen Reactions   Fenofibrate     Other reaction(s): Muscle Pain   Losartan Other (See Comments)    Caused weakness, chest pressure, and shortness of breath     SOCIAL HISTORY:  Social History   Socioeconomic History   Marital status: Married    Spouse name: Not on file   Number of children: Not on file   Years of education: Not on file   Highest education level: Not on file  Occupational History   Occupation: retired physician  Tobacco Use   Smoking status: Never   Smokeless tobacco: Never  Vaping Use   Vaping Use: Never used  Substance and Sexual Activity   Alcohol use: Yes    Alcohol/week: 1.0 standard drink of alcohol    Types: 1 Glasses of wine per week    Comment: occasional   Drug use: No   Sexual activity: Not Currently  Other Topics  Concern   Not on file  Social History Narrative   Not on file   Social Determinants of Health   Financial Resource Strain: Not on file  Food Insecurity: No Food Insecurity (12/26/2022)   Hunger Vital Sign    Worried About Running Out of Food in the Last Year: Never true    Ran Out of Food in the Last Year: Never true  Transportation Needs: No Transportation Needs (12/26/2022)   PRAPARE - Administrator, Civil Service (Medical): No    Lack of Transportation (Non-Medical): No  Physical Activity: Not on file  Stress: Not  on file  Social Connections: Not on file  Intimate Partner Violence: Not At Risk (12/26/2022)   Humiliation, Afraid, Rape, and Kick questionnaire    Fear of Current or Ex-Partner: No    Emotionally Abused: No    Physically Abused: No    Sexually Abused: No     FAMILY HISTORY:  Family History  Problem Relation Age of Onset   Cancer Mother    CAD Father    CAD Sister    CAD Sister    CAD Sister       REVIEW OF SYSTEMS:  Review of Systems  Constitutional:  Negative for chills and fever.  Respiratory:  Negative for cough and shortness of breath.   Cardiovascular:  Negative for chest pain and palpitations.  Gastrointestinal:  Positive for abdominal pain. Negative for constipation, diarrhea, nausea and vomiting.  Genitourinary:  Negative for dysuria and urgency.  All other systems reviewed and are negative.   VITAL SIGNS:  Temp:  [97.9 F (36.6 C)] 97.9 F (36.6 C) (07/01 0200) Pulse Rate:  [56] 56 (07/01 0200) Resp:  [18] 18 (07/01 0200) BP: (157)/(72) 157/72 (07/01 0200) SpO2:  [96 %] 96 % (07/01 0200) Weight:  [93.8 kg] 93.8 kg (07/01 0700)     Height: 5\' 10"  (177.8 cm) Weight: 93.8 kg BMI (Calculated): 29.67   INTAKE/OUTPUT:  No intake/output data recorded.  PHYSICAL EXAM:  Physical Exam Vitals and nursing note reviewed. Exam conducted with a chaperone present.  Constitutional:      General: He is not in acute distress.    Appearance:  He is well-developed. He is not ill-appearing.     Comments: Resting in bed; NAD  HENT:     Head: Normocephalic and atraumatic.  Eyes:     General: No scleral icterus.    Extraocular Movements: Extraocular movements intact.  Cardiovascular:     Rate and Rhythm: Normal rate.     Heart sounds: Normal heart sounds.  Pulmonary:     Effort: Pulmonary effort is normal. No respiratory distress.  Abdominal:     General: A surgical scar is present. There is distension.     Palpations: Abdomen is soft.     Tenderness: There is abdominal tenderness in the epigastric area and periumbilical area. There is no guarding or rebound.     Comments: Abdomen is soft, mild distension, upper abdominal soreness. No rebound/guarding. He is certainly not peritonitic. Does seem to have loss of domain with palpable firmness secondary to known mesh on examination supraumbilically. Noted previous scars   Genitourinary:    Comments: Deferred Neurological:     General: No focal deficit present.     Mental Status: He is alert and oriented to person, place, and time.  Psychiatric:        Mood and Affect: Mood normal.        Behavior: Behavior normal.      Labs:     Latest Ref Rng & Units 12/26/2022    2:00 AM 01/14/2021    9:24 PM 07/03/2020    5:11 AM  CBC  WBC 4.0 - 10.5 K/uL 11.4  8.7  10.9   Hemoglobin 13.0 - 17.0 g/dL 57.8  46.9  62.9   Hematocrit 39.0 - 52.0 % 36.3  39.0  38.3   Platelets 150 - 400 K/uL 337  268  265       Latest Ref Rng & Units 12/26/2022    2:00 AM 01/14/2021    9:24 PM 07/02/2020  6:30 PM  CMP  Glucose 70 - 99 mg/dL 161  096  045   BUN 8 - 23 mg/dL 15  25  12    Creatinine 0.61 - 1.24 mg/dL 4.09  8.11  9.14   Sodium 135 - 145 mmol/L 133  133  133   Potassium 3.5 - 5.1 mmol/L 4.1  3.8  3.5   Chloride 98 - 111 mmol/L 99  97  92   CO2 22 - 32 mmol/L 25  28  31    Calcium 8.9 - 10.3 mg/dL 9.0  9.1  8.7   Total Protein 6.5 - 8.1 g/dL 6.7   7.5   Total Bilirubin 0.3 - 1.2 mg/dL 0.4    0.6   Alkaline Phos 38 - 126 U/L 63   91   AST 15 - 41 U/L 18   19   ALT 0 - 44 U/L 13   10     Imaging studies:   CT Abdomen/Pelvis (12/26/2022) personally reviewed with stomach and small bowel dilation, decompressed loops noted in right abdomen, no free air, no pneumatosis, noted changes to abdominal wall, and radiologist report reviewed below:  IMPRESSION: 1. Small-bowel obstruction with fluid backup into and distention of the stomach. There is a subcecal transitional distal ileal segment suggesting the etiology may be adhesions. 2. Mesenteric congestive changes in the anterior mid abdomen, similar to the 2018 study. No bowel pneumatosis or portal venous gas. 3. Scattered fluid in the mesenteric folds in the mid to lower abdomen. No free air. 4. Diverticulosis without evidence of diverticulitis. 5. Aortic and coronary artery atherosclerosis. 6. Small loculated posterolateral right pleural effusion with adjacent pleural-parenchymal opacities most likely due to atelectasis, not seen in 2018. 7. Prostatomegaly.   Assessment/Plan: (ICD-10's: K63.609) 89 y.o. 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   - Appreciate medicine admission - Agree with NGT decompression; LIS; monitor and record output - No emergent surgical intervention. Given his complex history will certainly given him multiple days of conservative measures before considering any interventions surgically.    - NPO + IVF support   - Monitor abdominal examination; on-going bowel function   - Serial KUBs; will order for tomorrow (07/02)  - Pain control prn; antiemetics prn - Mobilize as tolerated  - Further management per primary service; we will follow   All of the above findings and recommendations were discussed with the patient, and all of patient's questions were answered to his expressed  satisfaction.  Thank you for the opportunity to participate in this patient's care.   -- Lynden Oxford, PA-C [facility] 12/26/2022, 8:33 AM M-F: 7am - 4pm

## 2022-12-26 NOTE — ED Notes (Signed)
Pt given warm blanket as requested.  

## 2022-12-26 NOTE — Assessment & Plan Note (Signed)
PPI therapy will be resumed.

## 2022-12-26 NOTE — Assessment & Plan Note (Signed)
-   We will continue his Proscar and Flomax. ?

## 2022-12-26 NOTE — ED Provider Notes (Signed)
[facility] Provider Note    Event Date/Time   First MD Initiated Contact with Patient 12/26/22 0201     (approximate)   History   Abdominal Pain   HPI Tyler Pena is a 89 y.o. male with extensive abdominal surgical history who presents for evaluation of swelling and generalized abdominal pain.  He said the symptoms started after dinner this evening about 6 PM (approximately 8 hours prior to arrival.  The abdominal distention and mild generalized abdominal pain and pressure has steadily worsened since that time.  However he is continue to have bowel movements.  He has had no difficulty urinating.  He has a no recent fever, chest pain or shortness of breath.  Due to his extensive prior abdominal surgeries, he has had at least 10 episodes in the past where has had at least partial if not full bowel obstructions.     Physical Exam   Triage Vital Signs: ED Triage Vitals  Enc Vitals Group     BP 12/26/22 0200 (!) 157/72     Pulse Rate 12/26/22 0200 (!) 56     Resp 12/26/22 0200 18     Temp 12/26/22 0200 97.9 F (36.6 C)     Temp Source 12/26/22 0200 Oral     SpO2 12/26/22 0200 96 %     Weight --      Height --      Head Circumference --      Peak Flow --      Pain Score 12/26/22 0201 9     Pain Loc --      Pain Edu? --      Excl. in GC? --     Most recent vital signs: Vitals:   12/26/22 0200  BP: (!) 157/72  Pulse: (!) 56  Resp: 18  Temp: 97.9 F (36.6 C)  SpO2: 96%    General: Awake, no distress.  Appears younger than chronological age.  Alert, oriented, no altered mental status.  Daughter is at bedside. CV:  Good peripheral perfusion.  Borderline bradycardia, regular rhythm.  Normal heart sounds. Resp:  Normal effort. Speaking easily and comfortably, no accessory muscle usage nor intercostal retractions.  Lungs are clear to auscultation. Abd:  Distended abdomen particularly in the left lower quadrant where he clearly has an incompletely  treated abdominal hernia.  He also has extensive well-healed scarring on the external abdomen.  He is somewhat tympanitic and mildly tender to palpation but nonperitoneal   ED Results / Procedures / Treatments   Labs (all labs ordered are listed, but only abnormal results are displayed) Labs Reviewed  COMPREHENSIVE METABOLIC PANEL - Abnormal; Notable for the following components:      Result Value   Sodium 133 (*)    Glucose, Bld 179 (*)    Albumin 3.3 (*)    All other components within normal limits  CBC WITH DIFFERENTIAL/PLATELET - Abnormal; Notable for the following components:   WBC 11.4 (*)    Hemoglobin 11.4 (*)    HCT 36.3 (*)    MCV 78.9 (*)    MCH 24.8 (*)    RDW 15.8 (*)    Neutro Abs 7.9 (*)    All other components within normal limits  LACTIC ACID, PLASMA  LIPASE, BLOOD     EKG  ED ECG REPORT I, Loleta Rose, the attending physician, personally viewed and interpreted this ECG.  Date: 12/26/2022 EKG Time: 1:59 AM Rate: 88 Rhythm: Borderline sinus bradycardia with first-degree  AV block QRS Axis: normal Intervals: PR interval of 381 ms, otherwise unremarkable ST/T Wave abnormalities: Non-specific ST segment / T-wave changes, but no clear evidence of acute ischemia. Narrative Interpretation: no definitive evidence of acute ischemia; does not meet STEMI criteria.    RADIOLOGY I viewed and interpreted the patient's acute abdomen series of x-rays, see hospital course for details.  I also viewed and interpreted the patient's CT scan of the abdomen pelvis.  See hospital course for details.   PROCEDURES:  Critical Care performed: No  .1-3 Lead EKG Interpretation  Performed by: Loleta Rose, MD Authorized by: Loleta Rose, MD     Interpretation: abnormal     ECG rate:  56   ECG rate assessment: bradycardic     Rhythm: sinus bradycardia     Ectopy: none     Conduction: normal       IMPRESSION / MDM / ASSESSMENT AND PLAN / ED COURSE  I reviewed  the triage vital signs and the nursing notes.                              Differential diagnosis includes, but is not limited to, SBO/ileus, volvulus, acute intra-abdominal infection, electrolyte or metabolic abnormality.  Patient's presentation is most consistent with acute presentation with potential threat to life or bodily function.  Labs/studies ordered: Lactic acid, lipase, CMP, CBC with differential, CT abdomen/pelvis, acute abdomen x-ray series  Interventions/Medications given:  Medications  0.9 %  sodium chloride infusion (has no administration in time range)  finasteride (PROSCAR) tablet 5 mg (has no administration in time range)  tamsulosin (FLOMAX) capsule 0.4 mg (has no administration in time range)  multivitamin with minerals tablet 1 tablet (has no administration in time range)  pantoprazole (PROTONIX) injection 40 mg (has no administration in time range)  enoxaparin (LOVENOX) injection 40 mg (has no administration in time range)  0.9 %  sodium chloride infusion (has no administration in time range)  acetaminophen (TYLENOL) tablet 650 mg (has no administration in time range)    Or  acetaminophen (TYLENOL) suppository 650 mg (has no administration in time range)  traZODone (DESYREL) tablet 25 mg (has no administration in time range)  ondansetron (ZOFRAN) tablet 4 mg (has no administration in time range)    Or  ondansetron (ZOFRAN) injection 4 mg (has no administration in time range)  ondansetron (ZOFRAN) injection 4 mg (4 mg Intravenous Given 12/26/22 0228)  iohexol (OMNIPAQUE) 300 MG/ML solution 100 mL (100 mLs Intravenous Contrast Given 12/26/22 0245)  droperidol (INAPSINE) 2.5 MG/ML injection 1.25 mg (1.25 mg Intravenous Given 12/26/22 0351)    (Note:  hospital course my include additional interventions and/or labs/studies not listed above.)   Based on physical exam, strongly suspect at least partial SBO.  Nonperitoneal exam.  Stable vital signs.  Labs are  pending.  The patient is on the cardiac monitor to evaluate for evidence of arrhythmia and/or significant heart rate changes.   Clinical Course as of 12/26/22 0659  Mon Dec 26, 2022  1610 DG Abdomen Acute W/Chest I ordered a acute abdomen series because our CT scanners have gone down.  However, I viewed and interpreted the x-rays, and they are not diagnostic for bowel obstruction.  We will proceed with CT scan when the imaging is available again. [CF]  0548 CT ABDOMEN PELVIS W CONTRAST I viewed and interpreted the patient's CT of the abdomen pelvis.  It appears consistent with small bowel obstruction.  Radiologist mentioned a transition point that is likely due to adhesions.  I updated the patient and his daughter and explained the need for an NG tube.  He is still feeling a little bit nauseated but better than before.  I will order the NG tube and I am consulting surgery given the presence of adhesions and a transition point, but I suspect this is best managed nonoperatively.  The patient also would prefer not to have surgery. [CF]  8295 Consulted Dr. Everlene Farrier with surgery.  He confirmed that this is best managed nonoperatively and recommended hospitalist consult.  I will do so and I have ordered the NG tube. [CF]    Clinical Course User Index [CF] Loleta Rose, MD     FINAL CLINICAL IMPRESSION(S) / ED DIAGNOSES   Final diagnoses:  Small bowel obstruction (HCC)     Rx / DC Orders   ED Discharge Orders     None        Note:  This document was prepared using Dragon voice recognition software and may include unintentional dictation errors.   Loleta Rose, MD 12/26/22 [phone]

## 2022-12-26 NOTE — Progress Notes (Signed)
[facility]  - [place] [facility]   PATIENT NAME: Tyler Pena    MR#:  [id number]  DATE OF BIRTH:  [birth year]  SUBJECTIVE:  no family at bedside. Spoke with daughter Tyler Pena on the phone. Patient apparently came in with abdominal distention and pain. He has had SBO in the past. Currently overall stable. Has NG tube.    VITALS:  Blood pressure (!) 157/72, pulse (!) 56, temperature 97.9 F (36.6 C), temperature source Oral, resp. rate 18, height 5\' 10"  (1.778 m), weight 93.8 kg, SpO2 96 %.  PHYSICAL EXAMINATION:   GENERAL:  89 y.o.-year-old patient with no acute distress. NG+ LUNGS: Normal breath sounds bilaterally, no wheezing CARDIOVASCULAR: S1, S2 normal. No murmur   ABDOMEN: Soft, distended. Bowel sounds present. Large abdominal ventral hernia present. Previous surgical scar present EXTREMITIES: No  edema b/l.    NEUROLOGIC: nonfocal  patient is alert and awake SKIN: No obvious rash, lesion, or ulcer.   LABORATORY PANEL:  CBC Recent Labs  Lab 12/26/22 0200  WBC 11.4*  HGB 11.4*  HCT 36.3*  PLT 337    Chemistries  Recent Labs  Lab 12/26/22 0200  NA 133*  K 4.1  CL 99  CO2 25  GLUCOSE 179*  BUN 15  CREATININE 0.97  CALCIUM 9.0  AST 18  ALT 13  ALKPHOS 63  BILITOT 0.4   Cardiac Enzymes No results for input(s): "TROPONINI" in the last 168 hours. RADIOLOGY:  DG Abd Portable 1 View  Result Date: 12/26/2022 CLINICAL DATA:  89 year old male NG tube placement. EXAM: PORTABLE ABDOMEN - 1 VIEW COMPARISON:  CT Abdomen and Pelvis 0415 hours today. FINDINGS: Portable AP upright view at 0628 hours. Satisfactory enteric tube placement into the stomach, looping in the left upper quadrant. Visible bowel-gas pattern and lung bases are stable. IMPRESSION: Satisfactory enteric tube placement into the stomach. Electronically Signed   By: Odessa Fleming M.D.   On: 12/26/2022 06:58   CT ABDOMEN PELVIS W CONTRAST  Result Date: 12/26/2022 CLINICAL DATA:  Abdominal  pain, nausea, and suspected bowel obstruction onset yesterday evening.There is a previous history of small-bowel obstruction. EXAM: CT ABDOMEN AND PELVIS WITH CONTRAST TECHNIQUE: Multidetector CT imaging of the abdomen and pelvis was performed using the standard protocol following bolus administration of intravenous contrast. RADIATION DOSE REDUCTION: This exam was performed according to the departmental dose-optimization program which includes automated exposure control, adjustment of the mA and/or kV according to patient size and/or use of iterative reconstruction technique. CONTRAST:  OMNIPAQUE IOHEXOL 300 MG/ML  SOLN COMPARISON:  CT with IV contrast 11/07/2016. FINDINGS: Lower chest: Again noted is distention of the distal thoracic esophagus, small hiatal hernia, and refluxed fluid in the distal esophagus, seen previously. There is mild cardiomegaly. Coronary artery calcifications are noted. No pericardial effusion. There is new demonstration of a small loculated posterolateral right pleural effusion. There are adjacent pleural-parenchymal opacities which are most likely due to atelectasis in the right middle and lower lobes. The left lung base remains clear. Hepatobiliary: The liver is 18 cm length with mild steatosis without evidence of mass. Again noted surgical absence of gallbladder without biliary dilatation. Pancreas: Partially atrophic, otherwise unremarkable.  Unchanged. Spleen: Slightly prominent, 13.2 cm length. No focal abnormality. Splenic artery is heavily calcified. Adrenals/Urinary Tract: There is no adrenal mass. Bilateral simple renal cysts are again noted and do not require imaging follow-up. There is no mass enhancement. There is no urinary stone or obstruction. The inferior aspect of the bladder  is obscured by spray artifact from a left hip replacement. The visualized bladder is normal in thickness. Stomach/Bowel: There is small bowel dilatation to 3.6 cm with fluid backup into and  distention of the stomach. There is a subcecal transitional distal ileal segment on 2:57 suggesting the etiology may be adhesions. In the anterior mid abdomen mild mesenteric congestive changes are similar to the 2018 study which also showed a small bowel obstruction, but at that time with a more abrupt transition and more medially located than the transitional segment on today's exam. An appendix is not seen in this patient. No bowel pneumatosis or portal venous gas are identified. There is left colonic diverticulosis without evidence of diverticulitis, with the most advanced diverticular disease in the sigmoid. Vascular/Lymphatic: Aortic atherosclerosis. No enlarged abdominal or pelvic lymph nodes. Reproductive: Enlarged prostate again noted measuring 5.6 cm. Other: There's scattered fluid in the mesenteric folds in the mid to lower abdomen. No other ascites is seen. There is no free air, free hemorrhage or abscess. Broad-based abdominal wall hernia repairs intact and was seen previously. There are small inguinal fat hernias. Musculoskeletal: Old left hip replacement. Osteopenia and degenerative change of the spine, moderate right hip DJD. No acute osseous abnormality or aggressive lesions. IMPRESSION: 1. Small-bowel obstruction with fluid backup into and distention of the stomach. There is a subcecal transitional distal ileal segment suggesting the etiology may be adhesions. 2. Mesenteric congestive changes in the anterior mid abdomen, similar to the 2018 study. No bowel pneumatosis or portal venous gas. 3. Scattered fluid in the mesenteric folds in the mid to lower abdomen. No free air. 4. Diverticulosis without evidence of diverticulitis. 5. Aortic and coronary artery atherosclerosis. 6. Small loculated posterolateral right pleural effusion with adjacent pleural-parenchymal opacities most likely due to atelectasis, not seen in 2018. 7. Prostatomegaly. Aortic Atherosclerosis (ICD10-I70.0). Electronically Signed    By: Almira Bar M.D.   On: 12/26/2022 05:40   DG Abdomen Acute W/Chest  Result Date: 12/26/2022 CLINICAL DATA:  89 year old male with nausea, distended abdomen. Suspected bowel obstruction. EXAM: DG ABDOMEN ACUTE WITH 1 VIEW CHEST COMPARISON:  Portable chest 04/22/2021 and earlier. FINDINGS: Portable AP upright view of the chest at 0357 hours. Stable lung volumes and mediastinal contours. Chronic right pleural effusion or pleural scarring appears unchanged from 2 years ago. No superimposed pneumothorax or pneumoperitoneum. No acute pulmonary opacity. Chronic right rib fractures. AP portable upright and supine views of the abdomen and pelvis. No pneumoperitoneum. Previous ventral abdominal hernia repair with mesh. No bowel air-fluid levels on the upright view. On the supine views gas-filled mid abdominal small bowel loops are at the upper limits of normal. Large bowel gas is present to the rectum in nondilated colon. The appearance does not strongly suggest a bowel obstruction. Left hip arthroplasty. Degeneration in the spine. No acute osseous abnormality identified. IMPRESSION: 1. Radiographic bowel-gas pattern is not strongly suggestive of bowel obstruction and no pneumoperitoneum is identified. 2. No acute cardiopulmonary abnormality. Chronic right pleural effusion or pleural scarring stable since 2022, with underlying chronic right rib fractures. Electronically Signed   By: Odessa Fleming M.D.   On: 12/26/2022 04:10    Assessment and Plan  Tyler Pena is a 89 y.o. Caucasian male with medical history significant for essential hypertension, dyslipidemia, GERD, type diabetes mellitus and coronary artery disease status post PCI and stents, who presented to the emergency room with acute onset of abdominal pain that started after dinner with associated nausea without vomiting.  He noticed abdominal  distention   Abdominal and pelvic CT scan revealed the following: 1. Small-bowel obstruction with fluid backup  into and distention of the stomach. There is a subcecal transitional distal ileal segment suggesting the etiology may be adhesions. 2. Mesenteric congestive changes in the anterior mid abdomen, similar to the 2018 study. No bowel pneumatosis or portal venous gas. 3. Scattered fluid in the mesenteric folds in the mid to lower abdomen. No free air. 4. Diverticulosis without evidence of diverticulitis. 5. Aortic and coronary artery atherosclerosis. 6. Small loculated posterolateral right pleural effusion with adjacent pleural-parenchymal opacities most likely due to atelectasis, not seen in 2018. 7. Prostatomegaly. 8.  Aortic atherosclerosis.  Recurrent SBO (small bowel obstruction) (HCC history of large ventral hernia - NG tube inserted with low suction, NPO. - cont IV normal saline - General surgery consult with Dr Harrison Mons conservative management   BPH (benign prostatic hyperplasia) - continue his Proscar and Flomax.   GERD without esophagitis IV ppi Bid  Essential hypertension - IV prn hydralazine for now  H/o Lewy body dementia (per dter) --on exelon --follows with Dr Sherryll Burger   DVT prophylaxis: Lovenox.  Advanced Care Planning:  Code Status: The patient is DNR and DNI.   Family Communication:  dter Tyler Pena on the phone Consults :Gen surgery Dr Aleen Campi Level of care: Med-Surg Status is: Inpatient Remains inpatient appropriate because: SBO    TOTAL TIME TAKING CARE OF THIS PATIENT: 35 minutes.  >50% time spent on counselling and coordination of care  Note: This dictation was prepared with Dragon dictation along with smaller phrase technology. Any transcriptional errors that result from this process are unintentional.  Enedina Finner M.D    [facility]   CC: Primary care physician; Marisue Ivan, MD

## 2022-12-26 NOTE — Assessment & Plan Note (Addendum)
-   This is recurrent SBO. - The patient will be admitted to a medical bed. - He had an NG tube inserted with minimal drainage. - He will be kept NPO. - We will follow two-view abdomen x-ray. - He will be hydrated with IV normal saline - General surgery consult will be obtained. - Dr. Everlene Farrier was notified about the patient and is aware.

## 2022-12-26 NOTE — H&P (Addendum)
[place]   PATIENT NAME: Tyler Pena    MR#:  [id number]  DATE OF BIRTH:  08-26-1920  DATE OF ADMISSION:  12/26/2022  PRIMARY CARE PHYSICIAN: Marisue Ivan, MD   Patient is coming from: Home  REQUESTING/REFERRING PHYSICIAN: Loleta Rose, MD  CHIEF COMPLAINT:   Chief Complaint  Patient presents with   Abdominal Pain    HISTORY OF PRESENT ILLNESS:  Tyler Pena is a 89 y.o. Caucasian male with medical history significant for essential hypertension, dyslipidemia, GERD, type diabetes mellitus and coronary artery disease status post PCI and stents, who presented to the emergency room with acute onset of abdominal pain that started after dinner with associated nausea without vomiting.  He noticed abdominal distention.  He denied any chest pain or dyspnea or cough or wheezing.  No dysuria, oliguria or hematuria or flank pain.  No bleeding diathesis.  No chest pain or palpitations.  ED Course: When he came to the ER, BP was 157/72 with heart rate of 56 and otherwise normal vital signs.  Labs revealed Sodium 133 with blood glucose of 179 and albumin 3.3 and otherwise normal CMP.  Lactic acid was 1.3 and CBC showed WBC of 11.4 with anemia and mild neutrophilia. EKG as reviewed by me : Likely sinus rhythm with a rate of 58. Imaging: Abdominal and pelvic CT scan revealed the following: 1. Small-bowel obstruction with fluid backup into and distention of the stomach. There is a subcecal transitional distal ileal segment suggesting the etiology may be adhesions. 2. Mesenteric congestive changes in the anterior mid abdomen, similar to the 2018 study. No bowel pneumatosis or portal venous gas. 3. Scattered fluid in the mesenteric folds in the mid to lower abdomen. No free air. 4. Diverticulosis without evidence of diverticulitis. 5. Aortic and coronary artery atherosclerosis. 6. Small loculated posterolateral right pleural effusion with adjacent pleural-parenchymal opacities  most likely due to atelectasis, not seen in 2018. 7. Prostatomegaly. 8.  Aortic atherosclerosis.  The patient was given 2.5 mg of IV Inapsine and 4 mg of IV Zofran.  He will be admitted to a medical bed for further evaluation and management. PAST MEDICAL HISTORY:   Past Medical History:  Diagnosis Date   Arthritis    Bilateral renal cysts    Bladder cancer [facility])    urologist-  dr Vernie Ammons   BPH (benign prostatic hyperplasia)    Coronary artery disease    hx PCI w/ stenting in 10/ 2014 in [place]--- recently moved from [place] 2018 has not established a cardiologist yet but does have pcp    Dyslipidemia    Essential hypertension    GERD (gastroesophageal reflux disease)    Hematuria    History of gastric ulcer    History of hiatal hernia    History of kidney stones    History of melanoma in situ    several Excision's in situ and malignant melanoma's--  last one 06/ 2017 top of ear excision Stage 1 w/ negative margins/  other have been right upper arm, shoulder, chest, face   History of MI (myocardial infarction)    10/ 2014-- s/p  PCI and stenting x2   History of small bowel obstruction    fall 2017  s/p  abdominal lysis adhesions  (prior hx bowel obstruction total 8 times)   Nocturia    S/P right coronary artery (RCA) stent placement    10/ 2014  x2   Type 2 diabetes, diet controlled (HCC)  Wears glasses    Wears hearing aid    bilateral    PAST SURGICAL HISTORY:   Past Surgical History:  Procedure Laterality Date   ABDOMINAL HERNIA REPAIR  2012   ADDOMINAL LYSIS ADHESIONS  fall 2017   sbo   CHOLECYSTECTOMY OPEN  1971   and Appendectomy   CORONARY ANGIOPLASTY WITH STENT PLACEMENT  04-17-2013  in [place]   x2 stents to RCA   INGUINAL HERNIA REPAIR Bilateral 1995   TOTAL HIP ARTHROPLASTY Left 2008   TRANSURETHRAL RESECTION OF BLADDER TUMOR  09/2006   LEIOMYOMA    TRANSURETHRAL RESECTION OF BLADDER TUMOR WITH MITOMYCIN-C N/A 09/02/2016   Procedure:  TRANSURETHRAL RESECTION OF BLADDER TUMOR WITH MITOMYCIN-C;  Surgeon: Ihor Gully, MD;  Location: [facility];  Service: Urology;  Laterality: N/A;    SOCIAL HISTORY:   Social History   Tobacco Use   Smoking status: Never   Smokeless tobacco: Never  Substance Use Topics   Alcohol use: Yes    Alcohol/week: 1.0 standard drink of alcohol    Types: 1 Glasses of wine per week    Comment: occasional    FAMILY HISTORY:   Family History  Problem Relation Age of Onset   Cancer Mother    CAD Father    CAD Sister    CAD Sister    CAD Sister     DRUG ALLERGIES:   Allergies  Allergen Reactions   Fenofibrate     Other reaction(s): Muscle Pain   Losartan Other (See Comments)    Caused weakness, chest pressure, and shortness of breath    REVIEW OF SYSTEMS:   ROS As per history of present illness. All pertinent systems were reviewed above. Constitutional, HEENT, cardiovascular, respiratory, GI, GU, musculoskeletal, neuro, psychiatric, endocrine, integumentary and hematologic systems were reviewed and are otherwise negative/unremarkable except for positive findings mentioned above in the HPI.   MEDICATIONS AT HOME:   Prior to Admission medications   Medication Sig Start Date End Date Taking? Authorizing Provider  acetaminophen (TYLENOL) 500 MG tablet Take by mouth.    [provider]  aspirin EC 81 MG tablet Take 81 mg by mouth daily. Patient not taking: Reported on 07/03/2020    [provider]  Black Pepper-Turmeric 3-500 MG CAPS Take 2 capsules by mouth daily.    [provider]  Chromium-Cinnamon (CINNAMON PLUS CHROMIUM PO) Take 1 tablet by mouth 2 (two) times daily. 1000mg  cinnamon/  250mg  chromium    [provider]  finasteride (PROSCAR) 5 MG tablet Take 5 mg by mouth every evening.     [provider]  meloxicam (MOBIC) 15 MG tablet Take 15 mg by mouth daily as needed. 05/14/18   [provider]   metoprolol tartrate (LOPRESSOR) 25 MG tablet Take 50 mg by mouth every evening.  Patient not taking: Reported on 07/03/2020    [provider]  Multiple Vitamin (MULTIVITAMIN WITH MINERALS) TABS tablet Take 1 tablet by mouth daily.    [provider]  mupirocin cream (BACTROBAN) 2 % Apply to skin during each bandage change Patient not taking: Reported on 07/03/2020 10/02/19   Deirdre Evener, MD  Omega-3 Fatty Acids (SUPER OMEGA 3 EPA/DHA) 1000 MG CAPS Take 1 capsule by mouth daily. Omega 3 - DHA - EPA - Fish oil-- 1000mg / 120mg / 180mg  Patient not taking: Reported on 07/03/2020    [provider]  omeprazole (PRILOSEC) 20 MG capsule Take by mouth daily as needed.  [provider]  polyethylene glycol powder (GLYCOLAX/MIRALAX) 17 GM/SCOOP powder Take by mouth daily as needed.    [provider]  tamsulosin (FLOMAX) 0.4 MG CAPS capsule Take 0.4 mg by mouth every evening.     [provider]      VITAL SIGNS:  Blood pressure (!) 157/72, pulse (!) 56, temperature 97.9 F (36.6 C), temperature source Oral, resp. rate 18, SpO2 96 %.  PHYSICAL EXAMINATION:  Physical Exam  GENERAL:  89 y.o.-year-old Caucasian male patient lying in the bed with no acute distress.  EYES: Pupils equal, round, reactive to light and accommodation. No scleral icterus. Extraocular muscles intact.  HEENT: Head atraumatic, normocephalic. Oropharynx and nasopharynx clear.  NECK:  Supple, no jugular venous distention. No thyroid enlargement, no tenderness.  LUNGS: Normal breath sounds bilaterally, no wheezing, rales,rhonchi or crepitation. No use of accessory muscles of respiration.  CARDIOVASCULAR: Regular rate and rhythm, S1, S2 normal. No murmurs, rubs, or gallops.  ABDOMEN: Soft, distended with palpable large hernia and second currently diminished bowel sounds.   EXTREMITIES: No pedal edema, cyanosis, or clubbing.  NEUROLOGIC: Cranial nerves II through XII are intact.  Muscle strength 5/5 in all extremities. Sensation intact. Gait not checked.  PSYCHIATRIC: The patient is alert and oriented x 3.  Normal affect and good eye contact. SKIN: No obvious rash, lesion, or ulcer.   LABORATORY PANEL:   CBC Recent Labs  Lab 12/26/22 0200  WBC 11.4*  HGB 11.4*  HCT 36.3*  PLT 337   ------------------------------------------------------------------------------------------------------------------  Chemistries  Recent Labs  Lab 12/26/22 0200  NA 133*  K 4.1  CL 99  CO2 25  GLUCOSE 179*  BUN 15  CREATININE 0.97  CALCIUM 9.0  AST 18  ALT 13  ALKPHOS 63  BILITOT 0.4   ------------------------------------------------------------------------------------------------------------------  Cardiac Enzymes No results for input(s): "TROPONINI" in the last 168 hours. ------------------------------------------------------------------------------------------------------------------  RADIOLOGY:  DG Abd Portable 1 View  Result Date: 12/26/2022 CLINICAL DATA:  89 year old male NG tube placement. EXAM: PORTABLE ABDOMEN - 1 VIEW COMPARISON:  CT Abdomen and Pelvis 0415 hours today. FINDINGS: Portable AP upright view at 0628 hours. Satisfactory enteric tube placement into the stomach, looping in the left upper quadrant. Visible bowel-gas pattern and lung bases are stable. IMPRESSION: Satisfactory enteric tube placement into the stomach. Electronically Signed   By: Odessa Fleming M.D.   On: 12/26/2022 06:58   CT ABDOMEN PELVIS W CONTRAST  Result Date: 12/26/2022 CLINICAL DATA:  Abdominal pain, nausea, and suspected bowel obstruction onset yesterday evening.There is a previous history of small-bowel obstruction. EXAM: CT ABDOMEN AND PELVIS WITH CONTRAST TECHNIQUE: Multidetector CT imaging of the abdomen and pelvis was performed using the standard protocol following bolus administration of intravenous contrast. RADIATION DOSE REDUCTION: This exam was performed according to the  departmental dose-optimization program which includes automated exposure control, adjustment of the mA and/or kV according to patient size and/or use of iterative reconstruction technique. CONTRAST:  OMNIPAQUE IOHEXOL 300 MG/ML  SOLN COMPARISON:  CT with IV contrast 11/07/2016. FINDINGS: Lower chest: Again noted is distention of the distal thoracic esophagus, small hiatal hernia, and refluxed fluid in the distal esophagus, seen previously. There is mild cardiomegaly. Coronary artery calcifications are noted. No pericardial effusion. There is new demonstration of a small loculated posterolateral right pleural effusion. There are adjacent pleural-parenchymal opacities which are most likely due to atelectasis in the right middle and lower lobes. The left lung base remains clear. Hepatobiliary: The liver is 18 cm length with  mild steatosis without evidence of mass. Again noted surgical absence of gallbladder without biliary dilatation. Pancreas: Partially atrophic, otherwise unremarkable.  Unchanged. Spleen: Slightly prominent, 13.2 cm length. No focal abnormality. Splenic artery is heavily calcified. Adrenals/Urinary Tract: There is no adrenal mass. Bilateral simple renal cysts are again noted and do not require imaging follow-up. There is no mass enhancement. There is no urinary stone or obstruction. The inferior aspect of the bladder is obscured by spray artifact from a left hip replacement. The visualized bladder is normal in thickness. Stomach/Bowel: There is small bowel dilatation to 3.6 cm with fluid backup into and distention of the stomach. There is a subcecal transitional distal ileal segment on 2:57 suggesting the etiology may be adhesions. In the anterior mid abdomen mild mesenteric congestive changes are similar to the 2018 study which also showed a small bowel obstruction, but at that time with a more abrupt transition and more medially located than the transitional segment on today's exam. An  appendix is not seen in this patient. No bowel pneumatosis or portal venous gas are identified. There is left colonic diverticulosis without evidence of diverticulitis, with the most advanced diverticular disease in the sigmoid. Vascular/Lymphatic: Aortic atherosclerosis. No enlarged abdominal or pelvic lymph nodes. Reproductive: Enlarged prostate again noted measuring 5.6 cm. Other: There's scattered fluid in the mesenteric folds in the mid to lower abdomen. No other ascites is seen. There is no free air, free hemorrhage or abscess. Broad-based abdominal wall hernia repairs intact and was seen previously. There are small inguinal fat hernias. Musculoskeletal: Old left hip replacement. Osteopenia and degenerative change of the spine, moderate right hip DJD. No acute osseous abnormality or aggressive lesions. IMPRESSION: 1. Small-bowel obstruction with fluid backup into and distention of the stomach. There is a subcecal transitional distal ileal segment suggesting the etiology may be adhesions. 2. Mesenteric congestive changes in the anterior mid abdomen, similar to the 2018 study. No bowel pneumatosis or portal venous gas. 3. Scattered fluid in the mesenteric folds in the mid to lower abdomen. No free air. 4. Diverticulosis without evidence of diverticulitis. 5. Aortic and coronary artery atherosclerosis. 6. Small loculated posterolateral right pleural effusion with adjacent pleural-parenchymal opacities most likely due to atelectasis, not seen in 2018. 7. Prostatomegaly. Aortic Atherosclerosis (ICD10-I70.0). Electronically Signed   By: Almira Bar M.D.   On: 12/26/2022 05:40   DG Abdomen Acute W/Chest  Result Date: 12/26/2022 CLINICAL DATA:  89 year old male with nausea, distended abdomen. Suspected bowel obstruction. EXAM: DG ABDOMEN ACUTE WITH 1 VIEW CHEST COMPARISON:  Portable chest 04/22/2021 and earlier. FINDINGS: Portable AP upright view of the chest at 0357 hours. Stable lung volumes and mediastinal  contours. Chronic right pleural effusion or pleural scarring appears unchanged from 2 years ago. No superimposed pneumothorax or pneumoperitoneum. No acute pulmonary opacity. Chronic right rib fractures. AP portable upright and supine views of the abdomen and pelvis. No pneumoperitoneum. Previous ventral abdominal hernia repair with mesh. No bowel air-fluid levels on the upright view. On the supine views gas-filled mid abdominal small bowel loops are at the upper limits of normal. Large bowel gas is present to the rectum in nondilated colon. The appearance does not strongly suggest a bowel obstruction. Left hip arthroplasty. Degeneration in the spine. No acute osseous abnormality identified. IMPRESSION: 1. Radiographic bowel-gas pattern is not strongly suggestive of bowel obstruction and no pneumoperitoneum is identified. 2. No acute cardiopulmonary abnormality. Chronic right pleural effusion or pleural scarring stable since 2022, with underlying chronic right rib fractures. Electronically  Signed   By: Odessa Fleming M.D.   On: 12/26/2022 04:10      IMPRESSION AND PLAN:  Assessment and Plan: * SBO (small bowel obstruction) (HCC) - This is recurrent SBO. - The patient will be admitted to a medical bed. - He had an NG tube inserted with minimal drainage. - He will be kept NPO. - We will follow two-view abdomen x-ray. - He will be hydrated with IV normal saline - General surgery consult will be obtained. - Dr. Everlene Farrier was notified about the patient and is aware.  BPH (benign prostatic hyperplasia) - We will continue his Proscar and Flomax.  GERD without esophagitis PPI therapy will be resumed.  Essential hypertension - We will continue his antihypertensives.   DVT prophylaxis: Lovenox.  Advanced Care Planning:  Code Status: The patient is DNR and DNI.  This was discussed with him and his wife. Family Communication:  The plan of care was discussed in details with the patient (and family). I answered  all questions. The patient agreed to proceed with the above mentioned plan. Further management will depend upon hospital course. Disposition Plan: Back to previous home environment Consults called: General surgery All the records are reviewed and case discussed with ED provider.  Status is: Inpatient   At the time of the admission, it appears that the appropriate admission status for this patient is inpatient.  This is judged to be reasonable and necessary in order to provide the required intensity of service to ensure the patient's safety given the presenting symptoms, physical exam findings and initial radiographic and laboratory data in the context of comorbid conditions.  The patient requires inpatient status due to high intensity of service, high risk of further deterioration and high frequency of surveillance required.  I certify that at the time of admission, it is my clinical judgment that the patient will require inpatient hospital care extending more than 2 midnights.                            Dispo: The patient is from: Home              Anticipated d/c is to: Home              Patient currently is not medically stable to d/c.              Difficult to place patient: No  Hannah Beat M.D on 12/26/2022 at 7:06 AM  [facility]   From 7 PM-7 AM, contact night-coverage [email or web address]  CC: Primary care physician; Marisue Ivan, MD

## 2022-12-26 NOTE — Assessment & Plan Note (Signed)
-   We will continue his antihypertensives. 

## 2022-12-26 NOTE — ED Triage Notes (Signed)
Patient in from SN[facility] with c/o of abdominal pain, nausea with heartburn that started after dinner this evening around 6. Stated increase in abdominal protrusion. Hx of gallbladder removal and hernia.

## 2022-12-27 ENCOUNTER — Inpatient Hospital Stay: Payer: Medicare Other

## 2022-12-27 DIAGNOSIS — K56609 Unspecified intestinal obstruction, unspecified as to partial versus complete obstruction: Secondary | ICD-10-CM | POA: Diagnosis not present

## 2022-12-27 DIAGNOSIS — K5669 Other partial intestinal obstruction: Secondary | ICD-10-CM | POA: Diagnosis not present

## 2022-12-27 LAB — BASIC METABOLIC PANEL
Anion gap: 8 (ref 5–15)
BUN: 17 mg/dL (ref 8–23)
CO2: 23 mmol/L (ref 22–32)
Calcium: 8.1 mg/dL — ABNORMAL LOW (ref 8.9–10.3)
Chloride: 101 mmol/L (ref 98–111)
Creatinine, Ser: 0.78 mg/dL (ref 0.61–1.24)
GFR, Estimated: >60 mL/min (ref >60)
Glucose, Bld: 141 mg/dL — ABNORMAL HIGH (ref 70–99)
Potassium: 3.7 mmol/L (ref 3.5–5.1)
Sodium: 132 mmol/L — ABNORMAL LOW (ref 135–145)

## 2022-12-27 LAB — CBC
HCT: 31 % — ABNORMAL LOW (ref 39.0–52.0)
Hemoglobin: 9.7 g/dL — ABNORMAL LOW (ref 13.0–17.0)
MCH: 24.7 pg — ABNORMAL LOW (ref 26.0–34.0)
MCHC: 31.3 g/dL (ref 30.0–36.0)
MCV: 79.1 fL — ABNORMAL LOW (ref 80.0–100.0)
Platelets: 311 10*3/uL (ref 150–400)
RBC: 3.92 MIL/uL — ABNORMAL LOW (ref 4.22–5.81)
RDW: 15.9 % — ABNORMAL HIGH (ref 11.5–15.5)
WBC: 11.2 10*3/uL — ABNORMAL HIGH (ref 4.0–10.5)
nRBC: 0 % (ref 0.0–0.2)

## 2022-12-27 MED ORDER — METOPROLOL SUCCINATE ER 25 MG PO TB24
25.0000 mg | ORAL_TABLET | Freq: Every day | ORAL | Status: DC
  Administered 2022-12-27 – 2022-12-30 (×4): 25 mg via ORAL
  Filled 2022-12-27 (×4): qty 1

## 2022-12-27 NOTE — Progress Notes (Signed)
Transport arrived for KUB.  Small rectal BM green/black, formed.

## 2022-12-27 NOTE — Progress Notes (Signed)
[facility] SURGICAL PROGRESS NOTE (cpt [phone])  Hospital Day(s): 1.   Interval History: Patient seen and examined, no acute events or new complaints overnight. Patient reports he is doing well; feeling better. No significant abdominal pain. No fever, chills, nausea, emesis. Leukocytosis stable; 11.2K. Hgb to 9.7; suspect dilution. Renal function normal; sCr - 0.78; UO - 325 ccs + unmeasured. NGT fell out this morning while going to XR. Output last night recorded at 475 ccs. He has had BM and passed flatus.   Review of Systems:  Constitutional: denies fever, chills  HEENT: denies cough or congestion  Respiratory: denies any shortness of breath  Cardiovascular: denies chest pain or palpitations  Gastrointestinal: denies abdominal pain, N/V Genitourinary: denies burning with urination or urinary frequency Musculoskeletal: denies pain, decreased motor or sensation   Vital signs in last 24 hours: [min-max] current  Temp:  [97.8 F (36.6 C)-98.7 F (37.1 C)] 98.4 F (36.9 C) (07/02 0537) Pulse Rate:  [59-70] 59 (07/02 0537) Resp:  [18] 18 (07/02 0537) BP: (120-128)/(56-63) 123/56 (07/02 0537) SpO2:  [95 %-98 %] 95 % (07/02 0537)     Height: 5\' 10"  (177.8 cm) Weight: 93.8 kg BMI (Calculated): 29.67   Intake/Output last 2 shifts:  07/01 0701 - 07/02 0700 In: 30 [NG/GT:30] Out: 800 [Urine:325; Emesis/NG output:475]   Physical Exam:  Constitutional: alert, cooperative and no distress  HENT: normocephalic without obvious abnormality  Eyes: PERRL, EOM's grossly intact and symmetric  Respiratory: breathing non-labored at rest  Cardiovascular: regular rate and sinus rhythm  Gastrointestinal: soft, non-tender, and non-distended, no rebound/guarding. He is certainly not peritonitic. Does seem to have loss of domain with palpable firmness secondary to known mesh on examination supraumbilically. Noted previous scars   Musculoskeletal: no edema or wounds, motor and sensation  grossly intact, NT    Labs:     Latest Ref Rng & Units 12/27/2022    4:14 AM 12/26/2022    2:00 AM 01/14/2021    9:24 PM  CBC  WBC 4.0 - 10.5 K/uL 11.2  11.4  8.7   Hemoglobin 13.0 - 17.0 g/dL 9.7  60.4  54.0   Hematocrit 39.0 - 52.0 % 31.0  36.3  39.0   Platelets 150 - 400 K/uL 311  337  268       Latest Ref Rng & Units 12/27/2022    4:14 AM 12/26/2022    2:00 AM 01/14/2021    9:24 PM  CMP  Glucose 70 - 99 mg/dL 981  191  478   BUN 8 - 23 mg/dL 17  15  25    Creatinine 0.61 - 1.24 mg/dL 2.95  6.21  3.08   Sodium 135 - 145 mmol/L 132  133  133   Potassium 3.5 - 5.1 mmol/L 3.7  4.1  3.8   Chloride 98 - 111 mmol/L 101  99  97   CO2 22 - 32 mmol/L 23  25  28    Calcium 8.9 - 10.3 mg/dL 8.1  9.0  9.1   Total Protein 6.5 - 8.1 g/dL  6.7    Total Bilirubin 0.3 - 1.2 mg/dL  0.4    Alkaline Phos 38 - 126 U/L  63    AST 15 - 41 U/L  18    ALT 0 - 44 U/L  13       Imaging studies:   KUB (12/27/2022) personally reviewed still with air fluid filled loops of small, no free air, and radiologist report reviewed pending.Marland KitchenMarland Kitchen  Assessment/Plan: (ICD-10's: K65.609) 89 y.o. 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               - Will hold off on replacing NGT for now             - NPO + sips of water, ice chips; If does well today, can do formal CLD this afternoon - Continue IVF support; wean once diet initiated              - Monitor abdominal examination; on-going bowel function              - Serial KUB as needed              - Pain control prn; antiemetics prn - Mobilize as tolerated             - Further management per primary service; we will follow   All of the above findings and recommendations were discussed with the patient, and the medical team, and all of patient's questions were answered to his expressed satisfaction.  -- Lynden Oxford, PA-C [facility] 12/27/2022, 7:12 AM M-F: 7am - 4pm

## 2022-12-27 NOTE — Progress Notes (Signed)
Mobility Specialist - Progress Note   12/27/22 1225  Mobility  Activity Ambulated with assistance in room;Ambulated with assistance in hallway  Level of Assistance Standby assist, set-up cues, supervision of patient - no hands on  Assistive Device Front wheel walker  Distance Ambulated (ft) 340 ft  Activity Response Tolerated well  $Mobility charge 1 Mobility  Mobility Specialist Start Time (ACUTE ONLY) 1203  Mobility Specialist Stop Time (ACUTE ONLY) 1223  Mobility Specialist Time Calculation (min) (ACUTE ONLY) 20 min   Pt sitting in the recliner upon entry, utilizing RA. Pt STS to RW with supervision and indep dons pants, shirt and shoes. Pt amb two laps around the NS with supervision, tolerated well. Pt showed good safety awareness while navigating through the hallway and around objects within the room. Pt returned to room, left seated in the recliner with alarm set and needs within reach.    Zetta Bills Mobility Specialist 12/27/22 12:39 PM

## 2022-12-27 NOTE — Progress Notes (Signed)
Patient removed NGT, stating "I was trying to see what was coming out of there and it just slipped out."  Reports urge to have bowel movement at this time.

## 2022-12-27 NOTE — Progress Notes (Signed)
[facility]  - [place] [facility]   PATIENT NAME: Tyler Pena    MR#:  [id number]  DATE OF BIRTH:  [birth year]  SUBJECTIVE:  Met dter at bedside.  Patient apparently came in with abdominal distention and pain. He has had SBO in the past.  Currently overall stable. But patient NG tube fell out, patient had two lines and no nausea.   VITALS:  Blood pressure (!) 118/51, pulse 63, temperature 98.2 F (36.8 C), resp. rate 16, height 5\' 10"  (1.778 m), weight 93.8 kg, SpO2 98 %.  PHYSICAL EXAMINATION:   GENERAL:  89 y.o.-year-old patient with no acute distress. NG+ LUNGS: Normal breath sounds bilaterally, no wheezing CARDIOVASCULAR: S1, S2 normal. No murmur   ABDOMEN: Soft, distended. Bowel sounds present. Large abdominal ventral hernia present. Previous surgical scar present EXTREMITIES: No  edema b/l.    NEUROLOGIC: nonfocal  patient is alert and awake   LABORATORY PANEL:  CBC Recent Labs  Lab 12/27/22 0414  WBC 11.2*  HGB 9.7*  HCT 31.0*  PLT 311     Chemistries  Recent Labs  Lab 12/26/22 0200 12/27/22 0414  NA 133* 132*  K 4.1 3.7  CL 99 101  CO2 25 23  GLUCOSE 179* 141*  BUN 15 17  CREATININE 0.97 0.78  CALCIUM 9.0 8.1*  AST 18  --   ALT 13  --   ALKPHOS 63  --   BILITOT 0.4  --     Cardiac Enzymes No results for input(s): "TROPONINI" in the last 168 hours. RADIOLOGY:  DG Abd 2 Views  Result Date: 12/27/2022 CLINICAL DATA:  SBO (small bowel obstruction) (HCC) EXAM: ABDOMEN - 2 VIEW COMPARISON:  Abdominal radiograph 12/26/2022. FINDINGS: Improved small bowel dilation with persistent air-fluid levels, compatible with known small bowel obstruction. No evidence of free air. Right-sided pleural effusion. Polyarticular degenerative change. IMPRESSION: Improved small bowel dilation with persistent air-fluid levels, compatible with known small bowel obstruction. Electronically Signed   By: Feliberto Harts M.D.   On: 12/27/2022 10:32   DG Abd  Portable 1 View  Result Date: 12/26/2022 CLINICAL DATA:  89 year old male NG tube placement. EXAM: PORTABLE ABDOMEN - 1 VIEW COMPARISON:  CT Abdomen and Pelvis 0415 hours today. FINDINGS: Portable AP upright view at 0628 hours. Satisfactory enteric tube placement into the stomach, looping in the left upper quadrant. Visible bowel-gas pattern and lung bases are stable. IMPRESSION: Satisfactory enteric tube placement into the stomach. Electronically Signed   By: Odessa Fleming M.D.   On: 12/26/2022 06:58   CT ABDOMEN PELVIS W CONTRAST  Result Date: 12/26/2022 CLINICAL DATA:  Abdominal pain, nausea, and suspected bowel obstruction onset yesterday evening.There is a previous history of small-bowel obstruction. EXAM: CT ABDOMEN AND PELVIS WITH CONTRAST TECHNIQUE: Multidetector CT imaging of the abdomen and pelvis was performed using the standard protocol following bolus administration of intravenous contrast. RADIATION DOSE REDUCTION: This exam was performed according to the departmental dose-optimization program which includes automated exposure control, adjustment of the mA and/or kV according to patient size and/or use of iterative reconstruction technique. CONTRAST:  OMNIPAQUE IOHEXOL 300 MG/ML  SOLN COMPARISON:  CT with IV contrast 11/07/2016. FINDINGS: Lower chest: Again noted is distention of the distal thoracic esophagus, small hiatal hernia, and refluxed fluid in the distal esophagus, seen previously. There is mild cardiomegaly. Coronary artery calcifications are noted. No pericardial effusion. There is new demonstration of a small loculated posterolateral right pleural effusion. There are adjacent pleural-parenchymal opacities which are  most likely due to atelectasis in the right middle and lower lobes. The left lung base remains clear. Hepatobiliary: The liver is 18 cm length with mild steatosis without evidence of mass. Again noted surgical absence of gallbladder without biliary dilatation. Pancreas:  Partially atrophic, otherwise unremarkable.  Unchanged. Spleen: Slightly prominent, 13.2 cm length. No focal abnormality. Splenic artery is heavily calcified. Adrenals/Urinary Tract: There is no adrenal mass. Bilateral simple renal cysts are again noted and do not require imaging follow-up. There is no mass enhancement. There is no urinary stone or obstruction. The inferior aspect of the bladder is obscured by spray artifact from a left hip replacement. The visualized bladder is normal in thickness. Stomach/Bowel: There is small bowel dilatation to 3.6 cm with fluid backup into and distention of the stomach. There is a subcecal transitional distal ileal segment on 2:57 suggesting the etiology may be adhesions. In the anterior mid abdomen mild mesenteric congestive changes are similar to the 2018 study which also showed a small bowel obstruction, but at that time with a more abrupt transition and more medially located than the transitional segment on today's exam. An appendix is not seen in this patient. No bowel pneumatosis or portal venous gas are identified. There is left colonic diverticulosis without evidence of diverticulitis, with the most advanced diverticular disease in the sigmoid. Vascular/Lymphatic: Aortic atherosclerosis. No enlarged abdominal or pelvic lymph nodes. Reproductive: Enlarged prostate again noted measuring 5.6 cm. Other: There's scattered fluid in the mesenteric folds in the mid to lower abdomen. No other ascites is seen. There is no free air, free hemorrhage or abscess. Broad-based abdominal wall hernia repairs intact and was seen previously. There are small inguinal fat hernias. Musculoskeletal: Old left hip replacement. Osteopenia and degenerative change of the spine, moderate right hip DJD. No acute osseous abnormality or aggressive lesions. IMPRESSION: 1. Small-bowel obstruction with fluid backup into and distention of the stomach. There is a subcecal transitional distal ileal segment  suggesting the etiology may be adhesions. 2. Mesenteric congestive changes in the anterior mid abdomen, similar to the 2018 study. No bowel pneumatosis or portal venous gas. 3. Scattered fluid in the mesenteric folds in the mid to lower abdomen. No free air. 4. Diverticulosis without evidence of diverticulitis. 5. Aortic and coronary artery atherosclerosis. 6. Small loculated posterolateral right pleural effusion with adjacent pleural-parenchymal opacities most likely due to atelectasis, not seen in 2018. 7. Prostatomegaly. Aortic Atherosclerosis (ICD10-I70.0). Electronically Signed   By: Almira Bar M.D.   On: 12/26/2022 05:40   DG Abdomen Acute W/Chest  Result Date: 12/26/2022 CLINICAL DATA:  89 year old male with nausea, distended abdomen. Suspected bowel obstruction. EXAM: DG ABDOMEN ACUTE WITH 1 VIEW CHEST COMPARISON:  Portable chest 04/22/2021 and earlier. FINDINGS: Portable AP upright view of the chest at 0357 hours. Stable lung volumes and mediastinal contours. Chronic right pleural effusion or pleural scarring appears unchanged from 2 years ago. No superimposed pneumothorax or pneumoperitoneum. No acute pulmonary opacity. Chronic right rib fractures. AP portable upright and supine views of the abdomen and pelvis. No pneumoperitoneum. Previous ventral abdominal hernia repair with mesh. No bowel air-fluid levels on the upright view. On the supine views gas-filled mid abdominal small bowel loops are at the upper limits of normal. Large bowel gas is present to the rectum in nondilated colon. The appearance does not strongly suggest a bowel obstruction. Left hip arthroplasty. Degeneration in the spine. No acute osseous abnormality identified. IMPRESSION: 1. Radiographic bowel-gas pattern is not strongly suggestive of bowel obstruction and  no pneumoperitoneum is identified. 2. No acute cardiopulmonary abnormality. Chronic right pleural effusion or pleural scarring stable since 2022, with underlying chronic  right rib fractures. Electronically Signed   By: Odessa Fleming M.D.   On: 12/26/2022 04:10    Assessment and Plan  Devean Hempel is a 89 y.o. Caucasian male with medical history significant for essential hypertension, dyslipidemia, GERD, type diabetes mellitus and coronary artery disease status post PCI and stents, who presented to the emergency room with acute onset of abdominal pain that started after dinner with associated nausea without vomiting.  He noticed abdominal distention   Abdominal and pelvic CT scan revealed the following: 1. Small-bowel obstruction with fluid backup into and distention of the stomach. There is a subcecal transitional distal ileal segment suggesting the etiology may be adhesions. 2. Mesenteric congestive changes in the anterior mid abdomen, similar to the 2018 study. No bowel pneumatosis or portal venous gas. 3. Scattered fluid in the mesenteric folds in the mid to lower abdomen. No free air. 4. Diverticulosis without evidence of diverticulitis. 5. Aortic and coronary artery atherosclerosis. 6. Small loculated posterolateral right pleural effusion with adjacent pleural-parenchymal opacities most likely due to atelectasis, not seen in 2018. 7. Prostatomegaly. 8.  Aortic atherosclerosis.  Recurrent SBO (small bowel obstruction) (HCC history of large ventral hernia - NG tube inserted with low suction, NPO. - cont IV normal saline - General surgery consult with Dr Harrison Mons conservative management --7/2-- had to bowel movement. NG fell out. Overall feels better. Started on clear liquid diet by surgery   BPH (benign prostatic hyperplasia) - continue his Proscar and Flomax.   GERD without esophagitis IV ppi Bid  Essential hypertension - IV prn hydralazine for now  H/o Lewy body dementia (per dter) --on exelon --follows with Dr Sherryll Burger   DVT prophylaxis: Lovenox.  Advanced Care Planning:  Code Status:  DNR and DNI.   Family Communication:  dter suzanne  on the phone Consults :Gen surgery Dr Aleen Campi Level of care: Med-Surg Status is: Inpatient Remains inpatient appropriate because: SBO    TOTAL TIME TAKING CARE OF THIS PATIENT: 35 minutes.  >50% time spent on counselling and coordination of care  Note: This dictation was prepared with Dragon dictation along with smaller phrase technology. Any transcriptional errors that result from this process are unintentional.  Enedina Finner M.D    [facility]   CC: Primary care physician; Marisue Ivan, MD

## 2022-12-28 DIAGNOSIS — K56609 Unspecified intestinal obstruction, unspecified as to partial versus complete obstruction: Secondary | ICD-10-CM | POA: Diagnosis not present

## 2022-12-28 DIAGNOSIS — K5669 Other partial intestinal obstruction: Secondary | ICD-10-CM | POA: Diagnosis not present

## 2022-12-28 LAB — CBC
HCT: 28.4 % — ABNORMAL LOW (ref 39.0–52.0)
Hemoglobin: 8.7 g/dL — ABNORMAL LOW (ref 13.0–17.0)
MCH: 24.7 pg — ABNORMAL LOW (ref 26.0–34.0)
MCHC: 30.6 g/dL (ref 30.0–36.0)
MCV: 80.7 fL (ref 80.0–100.0)
Platelets: 268 10*3/uL (ref 150–400)
RBC: 3.52 MIL/uL — ABNORMAL LOW (ref 4.22–5.81)
RDW: 15.9 % — ABNORMAL HIGH (ref 11.5–15.5)
WBC: 8.1 10*3/uL (ref 4.0–10.5)
nRBC: 0 % (ref 0.0–0.2)

## 2022-12-28 LAB — IRON AND TIBC
Iron: 33 ug/dL — ABNORMAL LOW (ref 45–182)
Saturation Ratios: 10 % — ABNORMAL LOW (ref 17.9–39.5)
TIBC: 344 ug/dL (ref 250–450)
UIBC: 311 ug/dL

## 2022-12-28 LAB — URINALYSIS, COMPLETE (UACMP) WITH MICROSCOPIC
Bacteria, UA: NONE SEEN
Bilirubin Urine: NEGATIVE
Glucose, UA: NEGATIVE mg/dL
Hgb urine dipstick: NEGATIVE
Ketones, ur: NEGATIVE mg/dL
Leukocytes,Ua: NEGATIVE
Nitrite: NEGATIVE
Protein, ur: NEGATIVE mg/dL
Specific Gravity, Urine: 1.017 (ref 1.005–1.030)
Squamous Epithelial / HPF: NONE SEEN /HPF (ref 0–5)
pH: 5 (ref 5.0–8.0)

## 2022-12-28 LAB — BASIC METABOLIC PANEL
Anion gap: 7 (ref 5–15)
BUN: 14 mg/dL (ref 8–23)
CO2: 23 mmol/L (ref 22–32)
Calcium: 7.9 mg/dL — ABNORMAL LOW (ref 8.9–10.3)
Chloride: 101 mmol/L (ref 98–111)
Creatinine, Ser: 0.72 mg/dL (ref 0.61–1.24)
GFR, Estimated: >60 mL/min (ref >60)
Glucose, Bld: 158 mg/dL — ABNORMAL HIGH (ref 70–99)
Potassium: 3.8 mmol/L (ref 3.5–5.1)
Sodium: 131 mmol/L — ABNORMAL LOW (ref 135–145)

## 2022-12-28 LAB — OCCULT BLOOD X 1 CARD TO LAB, STOOL: Fecal Occult Bld: POSITIVE — AB

## 2022-12-28 LAB — FOLATE: Folate: 33 ng/mL (ref >5.9)

## 2022-12-28 LAB — VITAMIN D 25 HYDROXY (VIT D DEFICIENCY, FRACTURES): Vit D, 25-Hydroxy: 49.8 ng/mL (ref 30–100)

## 2022-12-28 LAB — HEMOGLOBIN AND HEMATOCRIT, BLOOD
HCT: 32 % — ABNORMAL LOW (ref 39.0–52.0)
Hemoglobin: 9.8 g/dL — ABNORMAL LOW (ref 13.0–17.0)

## 2022-12-28 LAB — MAGNESIUM: Magnesium: 2.1 mg/dL (ref 1.7–2.4)

## 2022-12-28 LAB — VITAMIN B12: Vitamin B-12: 242 pg/mL (ref 180–914)

## 2022-12-28 LAB — PHOSPHORUS: Phosphorus: 2.4 mg/dL — ABNORMAL LOW (ref 2.5–4.6)

## 2022-12-28 MED ORDER — PANTOPRAZOLE SODIUM 40 MG PO TBEC
40.0000 mg | DELAYED_RELEASE_TABLET | Freq: Every day | ORAL | Status: DC
  Administered 2022-12-29 – 2022-12-30 (×2): 40 mg via ORAL
  Filled 2022-12-28 (×2): qty 1

## 2022-12-28 NOTE — Progress Notes (Signed)
[facility] Progress Note  Patient: Tyler Pena    UEA:[id number]  DOA: 12/26/2022     Date of Service: the patient was seen and examined on 12/28/2022  Chief Complaint  Patient presents with   Abdominal Pain   Brief hospital course: Asa Harsha is a 89 y.o. Caucasian male with medical history significant for essential hypertension, dyslipidemia, GERD, type diabetes mellitus and coronary artery disease status post PCI and stents, who presented to the emergency room with acute onset of abdominal pain that started after dinner with associated nausea without vomiting.  He noticed abdominal distention    Abdominal and pelvic CT scan revealed the following: 1. Small-bowel obstruction with fluid backup into and distention of the stomach. There is a subcecal transitional distal ileal segment suggesting the etiology may be adhesions. 2. Mesenteric congestive changes in the anterior mid abdomen, similar to the 2018 study. No bowel pneumatosis or portal venous gas. 3. Scattered fluid in the mesenteric folds in the mid to lower abdomen. No free air. 4. Diverticulosis without evidence of diverticulitis. 5. Aortic and coronary artery atherosclerosis. 6. Small loculated posterolateral right pleural effusion with adjacent pleural-parenchymal opacities most likely due to atelectasis, not seen in 2018. 7. Prostatomegaly. 8.  Aortic atherosclerosis.  Assessment and Plan: Recurrent SBO (small bowel obstruction) (HCC history of large ventral hernia - NG tube inserted with low suction, NPO. - cont IV normal saline - General surgery consult with Dr Harrison Mons conservative management --7/2-- had to bowel movement. NG fell out. Overall feels better. Started on clear liquid diet by surgery 7/3 soft diet today   BPH (benign prostatic hyperplasia) - continue his Proscar and Flomax.   GERD without esophagitis IV ppi Bid   Essential hypertension - IV prn hydralazine for now   H/o Lewy body  dementia (per dter) --on exelon --follows with Dr Sherryll Burger   Body mass index is 29.67 kg/m.  Interventions:  Diet: FLD DVT Prophylaxis: Subcutaneous Lovenox   Advance goals of care discussion: DNR  Family Communication: family was present at bedside, at the time of interview.  The pt provided permission to discuss medical plan with the family. Opportunity was given to ask question and all questions were answered satisfactorily.   Disposition:  Pt is from Home, admitted with SBO, still on FLD, which precludes a safe discharge. Discharge to Home, when stable, may be tomorrow.  Subjective: No event overnight, passing gas and had BM, no Abd pain, tolerated Diet well  Physical Exam: General: NAD, lying comfortably Appear in no distress, affect appropriate Eyes: PERRLA ENT: Oral Mucosa Clear, moist  Neck: no JVD,  Cardiovascular: S1 and S2 Present, no Murmur,  Respiratory: good respiratory effort, Bilateral Air entry equal and Decreased, no Crackles, no wheezes Abdomen: Bowel Sound present, distended, firm, and no tenderness, ventral hernia s/p repair Skin: no rashes Extremities: no Pedal edema, no calf tenderness Neurologic: without any new focal findings Gait not checked due to patient safety concerns  Vitals:   12/27/22 1624 12/28/22 0504 12/28/22 0722 12/28/22 1635  BP: (!) 128/58 (!) 119/56 134/78 (!) 145/57  Pulse: 69 61 62 (!) 56  Resp: 17 18 16 18   Temp: 97.9 F (36.6 C) 97.8 F (36.6 C) 97.7 F (36.5 C) 98.1 F (36.7 C)  TempSrc:   Oral Oral  SpO2: 99% 98% 96% 99%  Weight:      Height:        Intake/Output Summary (Last 24 hours) at 12/28/2022 1801 Last data filed at 12/28/2022 1340 Gross  per 24 hour  Intake 3350.09 ml  Output --  Net 3350.09 ml   Filed Weights   12/26/22 0700  Weight: 93.8 kg    Data Reviewed: I have personally reviewed and interpreted daily labs, tele strips, imagings as discussed above. I reviewed all nursing notes, pharmacy notes,  vitals, pertinent old records I have discussed plan of care as described above with RN and patient/family.  CBC: Recent Labs  Lab 12/26/22 0200 12/27/22 0414 12/28/22 0529 12/28/22 1611  WBC 11.4* 11.2* 8.1  --   NEUTROABS 7.9*  --   --   --   HGB 11.4* 9.7* 8.7* 9.8*  HCT 36.3* 31.0* 28.4* 32.0*  MCV 78.9* 79.1* 80.7  --   PLT 337 311 268  --    Basic Metabolic Panel: Recent Labs  Lab 12/26/22 0200 12/27/22 0414 12/28/22 0951  NA 133* 132* 131*  K 4.1 3.7 3.8  CL 99 101 101  CO2 25 23 23   GLUCOSE 179* 141* 158*  BUN 15 17 14   CREATININE 0.97 0.78 0.72  CALCIUM 9.0 8.1* 7.9*  MG  --   --  2.1  PHOS  --   --  2.4*    Studies: No results found.  Scheduled Meds:  enoxaparin (LOVENOX) injection  40 mg Subcutaneous Q24H   finasteride  5 mg Oral QPM   metoprolol succinate  25 mg Oral Daily   multivitamin with minerals  1 tablet Oral Daily   [START ON 12/29/2022] pantoprazole  40 mg Oral Daily   rivastigmine  1.5 mg Oral BID   spironolactone  25 mg Oral Daily   tamsulosin  0.4 mg Oral QPM   Continuous Infusions: PRN Meds: acetaminophen **OR** acetaminophen, hydrALAZINE, ondansetron **OR** ondansetron (ZOFRAN) IV, traZODone  Time spent: 35 minutes  Author: Gillis Santa. MD [facility] 12/28/2022 6:01 PM  To reach On-call, see care teams to locate the attending and reach out to them via [email or web address]. If 7PM-7AM, please contact night-coverage If you still have difficulty reaching the attending provider, please page the [facility] (Director on Call) for [facility] on amion for assistance.

## 2022-12-28 NOTE — TOC Progression Note (Signed)
Transition of Care [facility]) - Progression Note    Patient Details  Name: Tyler Pena MRN: [id number] Date of Birth: 04/22/1921  Transition of Care [facility] CM/SW Contact  Garret Reddish, RN Phone Number: 12/28/2022, 9:12 AM  Clinical Narrative:   Chart reviewed.  Noted that patient was admitted with Small Bowel Obstruction. NG tube if now removed and patient is tolerating a clear liquid diet.  Patient continues to have bowel function.    Noted that patient is from [facility].  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  Sue Lush informs me that she will follow up with Assisted Living team at [facility] to make sure that the patient does not have to return to the Skilled Nursing Level of care.    TOC will continue to follow for discharge planning.           Expected Discharge Plan and Services                                               Social Determinants of Health (SDOH) Interventions SDOH Screenings   Food Insecurity: No Food Insecurity (12/26/2022)  Housing: Low Risk  (12/26/2022)  Transportation Needs: No Transportation Needs (12/26/2022)  Utilities: Not At Risk (12/26/2022)  Tobacco Use: Low Risk  (12/26/2022)    Readmission Risk Interventions     No data to display

## 2022-12-28 NOTE — Progress Notes (Signed)
[facility] SURGICAL PROGRESS NOTE (cpt [phone])  Hospital Day(s): 2.   Interval History: Patient seen and examined, no acute events or new complaints overnight. Patient reports he is doing better. No abdominal pain, nausea, emesis. No significant abdominal pain. No fever, chills, nausea, emesis. Without leukocytosis; 8.1K. Hgb to 8.7; suspect dilution given the drop in all cell lines and need for IVF hydration. He continues to have bowel function. CLD; tolerating   Review of Systems:  Constitutional: denies fever, chills  HEENT: denies cough or congestion  Respiratory: denies any shortness of breath  Cardiovascular: denies chest pain or palpitations  Gastrointestinal: denies abdominal pain, N/V Genitourinary: denies burning with urination or urinary frequency Musculoskeletal: denies pain, decreased motor or sensation   Vital signs in last 24 hours: [min-max] current  Temp:  [97.7 F (36.5 C)-98.2 F (36.8 C)] 97.7 F (36.5 C) (07/03 0722) Pulse Rate:  [61-69] 62 (07/03 0722) Resp:  [16-18] 16 (07/03 0722) BP: (118-134)/(51-78) 134/78 (07/03 0722) SpO2:  [96 %-99 %] 96 % (07/03 0722)     Height: 5\' 10"  (177.8 cm) Weight: 93.8 kg BMI (Calculated): 29.67   Intake/Output last 2 shifts:  07/02 0701 - 07/03 0700 In: 4635.8 [I.V.:4635.8] Out: -    Physical Exam:  Constitutional: alert, cooperative and no distress  HENT: normocephalic without obvious abnormality  Eyes: PERRL, EOM's grossly intact and symmetric  Respiratory: breathing non-labored at rest  Cardiovascular: regular rate and sinus rhythm  Gastrointestinal: soft, non-tender, and non-distended, no rebound/guarding. He is certainly not peritonitic. Does seem to have loss of domain with palpable firmness secondary to known mesh on examination supraumbilically. Noted previous scars   Musculoskeletal: no edema or wounds, motor and sensation grossly intact, NT    Labs:     Latest Ref Rng & Units 12/28/2022     5:29 AM 12/27/2022    4:14 AM 12/26/2022    2:00 AM  CBC  WBC 4.0 - 10.5 K/uL 8.1  11.2  11.4   Hemoglobin 13.0 - 17.0 g/dL 8.7  9.7  60.4   Hematocrit 39.0 - 52.0 % 28.4  31.0  36.3   Platelets 150 - 400 K/uL 268  311  337       Latest Ref Rng & Units 12/27/2022    4:14 AM 12/26/2022    2:00 AM 01/14/2021    9:24 PM  CMP  Glucose 70 - 99 mg/dL 540  981  191   BUN 8 - 23 mg/dL 17  15  25    Creatinine 0.61 - 1.24 mg/dL 4.78  2.95  6.21   Sodium 135 - 145 mmol/L 132  133  133   Potassium 3.5 - 5.1 mmol/L 3.7  4.1  3.8   Chloride 98 - 111 mmol/L 101  99  97   CO2 22 - 32 mmol/L 23  25  28    Calcium 8.9 - 10.3 mg/dL 8.1  9.0  9.1   Total Protein 6.5 - 8.1 g/dL  6.7    Total Bilirubin 0.3 - 1.2 mg/dL  0.4    Alkaline Phos 38 - 126 U/L  63    AST 15 - 41 U/L  18    ALT 0 - 44 U/L  13       Imaging studies:  No new imaging studies    Assessment/Plan: (ICD-10's: K58.609) 89 y.o. 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               -  Advance diet as tolerated over next 24 hours   - Okay to stop IVF today  - Monitor H&H; suspect drop is dilutional in nature, no concern for bleeding but patient wants to be certain. Okay for hemoccult testing  - No surgical intervention              - Monitor abdominal examination; on-going bowel function              - Pain control prn; antiemetics prn - Mobilize as tolerated             - Further management per primary service   - Discharge Planning: He is doing well, SBO resolving with ROBF. Okay to advance diet today, stop IVF, and monitor tolerance. If he is doing well, anticipate okay for discharge home tomorrow (07/04).   All of the above findings and recommendations were discussed with the patient, and the medical team, and all of patient's questions were answered to his expressed  satisfaction.  -- Lynden Oxford, PA-C [facility] 12/28/2022, 7:30 AM M-F: 7am - 4pm

## 2022-12-29 DIAGNOSIS — K56609 Unspecified intestinal obstruction, unspecified as to partial versus complete obstruction: Secondary | ICD-10-CM | POA: Diagnosis not present

## 2022-12-29 LAB — OSMOLALITY: Osmolality: 279 mOsm/kg (ref 275–295)

## 2022-12-29 LAB — CBC
HCT: 30.4 % — ABNORMAL LOW (ref 39.0–52.0)
Hemoglobin: 9.4 g/dL — ABNORMAL LOW (ref 13.0–17.0)
MCH: 24.7 pg — ABNORMAL LOW (ref 26.0–34.0)
MCHC: 30.9 g/dL (ref 30.0–36.0)
MCV: 79.8 fL — ABNORMAL LOW (ref 80.0–100.0)
Platelets: 207 10*3/uL (ref 150–400)
RBC: 3.81 MIL/uL — ABNORMAL LOW (ref 4.22–5.81)
RDW: 16 % — ABNORMAL HIGH (ref 11.5–15.5)
WBC: 7 10*3/uL (ref 4.0–10.5)
nRBC: 0 % (ref 0.0–0.2)

## 2022-12-29 LAB — BASIC METABOLIC PANEL
Anion gap: 7 (ref 5–15)
BUN: 11 mg/dL (ref 8–23)
CO2: 23 mmol/L (ref 22–32)
Calcium: 7.9 mg/dL — ABNORMAL LOW (ref 8.9–10.3)
Chloride: 100 mmol/L (ref 98–111)
Creatinine, Ser: 0.8 mg/dL (ref 0.61–1.24)
GFR, Estimated: >60 mL/min (ref >60)
Glucose, Bld: 124 mg/dL — ABNORMAL HIGH (ref 70–99)
Potassium: 3.5 mmol/L (ref 3.5–5.1)
Sodium: 130 mmol/L — ABNORMAL LOW (ref 135–145)

## 2022-12-29 LAB — MAGNESIUM: Magnesium: 1.9 mg/dL (ref 1.7–2.4)

## 2022-12-29 LAB — PHOSPHORUS: Phosphorus: 2.5 mg/dL (ref 2.5–4.6)

## 2022-12-29 LAB — OCCULT BLOOD X 1 CARD TO LAB, STOOL: Fecal Occult Bld: NEGATIVE

## 2022-12-29 LAB — HEMOGLOBIN AND HEMATOCRIT, BLOOD
HCT: 32.5 % — ABNORMAL LOW (ref 39.0–52.0)
Hemoglobin: 10.2 g/dL — ABNORMAL LOW (ref 13.0–17.0)

## 2022-12-29 MED ORDER — SODIUM CHLORIDE 0.9 % IV SOLN
200.0000 mg | Freq: Every day | INTRAVENOUS | Status: DC
  Administered 2022-12-29 – 2022-12-30 (×2): 200 mg via INTRAVENOUS
  Filled 2022-12-29 (×2): qty 200

## 2022-12-29 MED ORDER — CYANOCOBALAMIN 1000 MCG/ML IJ SOLN
1000.0000 ug | Freq: Every day | INTRAMUSCULAR | Status: DC
  Administered 2022-12-29 – 2022-12-30 (×2): 1000 ug via INTRAMUSCULAR
  Filled 2022-12-29 (×2): qty 1

## 2022-12-29 MED ORDER — VITAMIN B-12 1000 MCG PO TABS: 1000.0000 ug | ORAL_TABLET | Freq: Every day | ORAL | Status: DC

## 2022-12-29 NOTE — Progress Notes (Signed)
[facility] Progress Note  Patient: Tyler Pena    RUE:[id number]  DOA: 12/26/2022     Date of Service: the patient was seen and examined on 12/29/2022  Chief Complaint  Patient presents with   Abdominal Pain   Brief hospital course: Tyler Pena is a 89 y.o. Caucasian male with medical history significant for essential hypertension, dyslipidemia, GERD, type diabetes mellitus and coronary artery disease status post PCI and stents, who presented to the emergency room with acute onset of abdominal pain that started after dinner with associated nausea without vomiting.  He noticed abdominal distention    Abdominal and pelvic CT scan revealed the following: 1. Small-bowel obstruction with fluid backup into and distention of the stomach. There is a subcecal transitional distal ileal segment suggesting the etiology may be adhesions. 2. Mesenteric congestive changes in the anterior mid abdomen, similar to the 2018 study. No bowel pneumatosis or portal venous gas. 3. Scattered fluid in the mesenteric folds in the mid to lower abdomen. No free air. 4. Diverticulosis without evidence of diverticulitis. 5. Aortic and coronary artery atherosclerosis. 6. Small loculated posterolateral right pleural effusion with adjacent pleural-parenchymal opacities most likely due to atelectasis, not seen in 2018. 7. Prostatomegaly. 8.  Aortic atherosclerosis.  Assessment and Plan: Recurrent SBO (small bowel obstruction) (HCC history of large ventral hernia S/p NG tube insertion and low suction. SBO resolved, diet was resumed, IV fluid discontinued. - General surgery consult with Dr Harrison Mons conservative management --7/2-- had to bowel movement. NG fell out. Overall feels better. Started on clear liquid diet by surgery 7/3 started soft diet  Patient was cleared by GI to discharge home and follow-up as an outpatient  Anemia due to iron deficiency and B12 at lower end Hb 9.4, FOBT positive,  possible intermittent GI bleeding Patient denies any frank GI bleeding Monitor H&H and transfuse if hemoglobin less than 7   Iron deficiency, transferrin saturation 10% Started Venofer 200 mg IV daily for 5 days followed by oral supplement on discharge. Follow-up with PCP to repeat iron profile after 3 to 6 months Vitamin B12 level 242, goal >400, started vitamin B12 1000 mcg IM injection daily during hospital stay followed by oral supplement on discharge.  Follow with PCP to repeat vitamin B12 level after 3 to 6 months.   Isotonic hyponatremia, most likely due to nutritional deficiency Serum osmolality 279 within normal range monitor sodium level daily   BPH (benign prostatic hyperplasia) - continue his Proscar and Flomax.   GERD without esophagitis IV ppi Bid   Essential hypertension - IV prn hydralazine for now   H/o Lewy body dementia (per dter) --on exelon --follows with Dr Sherryll Burger   Body mass index is 29.67 kg/m.  Interventions:  Diet: Soft Diet DVT Prophylaxis: SCD, pharmacological prophylaxis contraindicated due to low Hb    Advance goals of care discussion: DNR  Family Communication: family was present at bedside, at the time of interview.  The pt provided permission to discuss medical plan with the family. Opportunity was given to ask question and all questions were answered satisfactorily.   Disposition:  Pt is from Home, admitted with SBO which has been resolved, low hemoglobin, repeat CBC, which precludes a safe discharge. Discharge to Home, when stable, most likely tomorrow   Subjective: No event overnight, passing gas and had BM, no Abd pain, tolerated Diet well Patient would like H&H to be repeated in the evening and then tomorrow a.m.  Does not want GI consult.  We will  continue to monitor today and plan for discharge tomorrow a.m.  Physical Exam: General: NAD, lying comfortably Appear in no distress, affect appropriate Eyes: PERRLA ENT: Oral Mucosa  Clear, moist  Neck: no JVD,  Cardiovascular: S1 and S2 Present, no Murmur,  Respiratory: good respiratory effort, Bilateral Air entry equal and Decreased, no Crackles, no wheezes Abdomen: Bowel Sound present, distended, firm, and no tenderness, ventral hernia s/p repair Skin: no rashes Extremities: no Pedal edema, no calf tenderness Neurologic: without any new focal findings Gait not checked due to patient safety concerns  Vitals:   12/28/22 0722 12/28/22 1635 12/28/22 1947 12/29/22 0434  BP: 134/78 (!) 145/57 (!) 146/57 (!) 126/55  Pulse: 62 (!) 56 (!) 58 60  Resp: 16 18 16 16   Temp: 97.7 F (36.5 C) 98.1 F (36.7 C) 97.8 F (36.6 C) 98.7 F (37.1 C)  TempSrc: Oral Oral  Oral  SpO2: 96% 99% 99% 96%  Weight:      Height:        Intake/Output Summary (Last 24 hours) at 12/29/2022 1441 Last data filed at 12/28/2022 1845 Gross per 24 hour  Intake 300 ml  Output --  Net 300 ml   Filed Weights   12/26/22 0700  Weight: 93.8 kg    Data Reviewed: I have personally reviewed and interpreted daily labs, tele strips, imagings as discussed above. I reviewed all nursing notes, pharmacy notes, vitals, pertinent old records I have discussed plan of care as described above with RN and patient/family.  CBC: Recent Labs  Lab 12/26/22 0200 12/27/22 0414 12/28/22 0529 12/28/22 1611 12/29/22 0641  WBC 11.4* 11.2* 8.1  --  7.0  NEUTROABS 7.9*  --   --   --   --   HGB 11.4* 9.7* 8.7* 9.8* 9.4*  HCT 36.3* 31.0* 28.4* 32.0* 30.4*  MCV 78.9* 79.1* 80.7  --  79.8*  PLT 337 311 268  --  207   Basic Metabolic Panel: Recent Labs  Lab 12/26/22 0200 12/27/22 0414 12/28/22 0951 12/29/22 0641  NA 133* 132* 131* 130*  K 4.1 3.7 3.8 3.5  CL 99 101 101 100  CO2 25 23 23 23   GLUCOSE 179* 141* 158* 124*  BUN 15 17 14 11   CREATININE 0.97 0.78 0.72 0.80  CALCIUM 9.0 8.1* 7.9* 7.9*  MG  --   --  2.1 1.9  PHOS  --   --  2.4* 2.5    Studies: No results found.  Scheduled Meds:   cyanocobalamin  1,000 mcg Intramuscular Q1200   Followed by   Melene Muller ON 01/05/2023] vitamin B-12  1,000 mcg Oral Daily   finasteride  5 mg Oral QPM   metoprolol succinate  25 mg Oral Daily   multivitamin with minerals  1 tablet Oral Daily   pantoprazole  40 mg Oral Daily   rivastigmine  1.5 mg Oral BID   spironolactone  25 mg Oral Daily   tamsulosin  0.4 mg Oral QPM   Continuous Infusions:  iron sucrose 200 mg (12/29/22 1024)   PRN Meds: acetaminophen **OR** acetaminophen, hydrALAZINE, ondansetron **OR** ondansetron (ZOFRAN) IV, traZODone  Time spent: 35 minutes  Author: Gillis Santa. MD [facility] 12/29/2022 2:41 PM  To reach On-call, see care teams to locate the attending and reach out to them via [email or web address]. If 7PM-7AM, please contact night-coverage If you still have difficulty reaching the attending provider, please page the [facility] (Director on Call) for [facility] on amion for assistance.

## 2022-12-29 NOTE — Care Management Important Message (Signed)
Important Message  Patient Details  Name: Tyler Pena MRN: [id number] Date of Birth: September 12, 1920   Medicare Important Message Given:  Yes     Olegario Messier A Aprile Dickenson 12/29/2022, 11:29 AM

## 2022-12-29 NOTE — Progress Notes (Signed)
Subjective:  CC: Tyler Pena is a 89 y.o. male  Hospital stay day 3,   SBO  HPI: No issues reported overnight.  Tolerating soft diet.  Multiple bowel movements reported afterwards.  ROS:  General: Denies weight loss, weight gain, fatigue, fevers, chills, and night sweats. Heart: Denies chest pain, palpitations, racing heart, irregular heartbeat, leg pain or swelling, and decreased activity tolerance. Respiratory: Denies breathing difficulty, shortness of breath, wheezing, cough, and sputum. GI: Denies change in appetite, heartburn, nausea, vomiting, constipation, diarrhea, and blood in stool. GU: Denies difficulty urinating, pain with urinating, urgency, frequency, blood in urine.   Objective:   Temp:  [97.8 F (36.6 C)-98.7 F (37.1 C)] 98.7 F (37.1 C) (07/04 0434) Pulse Rate:  [56-60] 60 (07/04 0434) Resp:  [16-18] 16 (07/04 0434) BP: (126-146)/(55-57) 126/55 (07/04 0434) SpO2:  [96 %-99 %] 96 % (07/04 0434)     Height: 5\' 10"  (177.8 cm) Weight: 93.8 kg BMI (Calculated): 29.67   Intake/Output this shift:   Intake/Output Summary (Last 24 hours) at 12/29/2022 1051 Last data filed at 12/28/2022 1845 Gross per 24 hour  Intake 800 ml  Output --  Net 800 ml    Constitutional :  alert, cooperative, appears stated age, and no distress  Respiratory:  clear to auscultation bilaterally  Cardiovascular:  regular rate and rhythm  Gastrointestinal: soft, non-tender; bowel sounds normal; no masses,  no organomegaly.  Large ventral hernia remains, soft, reducible, no tenderness to palpation  Skin: Cool and moist.   Psychiatric: Normal affect, non-agitated, not confused       LABS:     Latest Ref Rng & Units 12/29/2022    6:41 AM 12/28/2022    9:51 AM 12/27/2022    4:14 AM  CMP  Glucose 70 - 99 mg/dL 161  096  045   BUN 8 - 23 mg/dL 11  14  17    Creatinine 0.61 - 1.24 mg/dL 4.09  8.11  9.14   Sodium 135 - 145 mmol/L 130  131  132   Potassium 3.5 - 5.1 mmol/L 3.5  3.8  3.7   Chloride  98 - 111 mmol/L 100  101  101   CO2 22 - 32 mmol/L 23  23  23    Calcium 8.9 - 10.3 mg/dL 7.9  7.9  8.1       Latest Ref Rng & Units 12/29/2022    6:41 AM 12/28/2022    4:11 PM 12/28/2022    5:29 AM  CBC  WBC 4.0 - 10.5 K/uL 7.0   8.1   Hemoglobin 13.0 - 17.0 g/dL 9.4  9.8  8.7   Hematocrit 39.0 - 52.0 % 30.4  32.0  28.4   Platelets 150 - 400 K/uL 207   268     RADS: N/a Assessment:   SBO.  Clinically resolved.  Surgery to sign off.  Please call with any additional questions or concerns.  labs/images/medications/previous chart entries reviewed personally and relevant changes/updates noted above.

## 2022-12-30 DIAGNOSIS — K56609 Unspecified intestinal obstruction, unspecified as to partial versus complete obstruction: Secondary | ICD-10-CM | POA: Diagnosis not present

## 2022-12-30 LAB — BASIC METABOLIC PANEL
Anion gap: 8 (ref 5–15)
BUN: 10 mg/dL (ref 8–23)
CO2: 25 mmol/L (ref 22–32)
Calcium: 8.2 mg/dL — ABNORMAL LOW (ref 8.9–10.3)
Chloride: 100 mmol/L (ref 98–111)
Creatinine, Ser: 0.81 mg/dL (ref 0.61–1.24)
GFR, Estimated: >60 mL/min (ref >60)
Glucose, Bld: 119 mg/dL — ABNORMAL HIGH (ref 70–99)
Potassium: 3.7 mmol/L (ref 3.5–5.1)
Sodium: 133 mmol/L — ABNORMAL LOW (ref 135–145)

## 2022-12-30 LAB — PHOSPHORUS: Phosphorus: 2.8 mg/dL (ref 2.5–4.6)

## 2022-12-30 LAB — CBC
HCT: 29.3 % — ABNORMAL LOW (ref 39.0–52.0)
Hemoglobin: 9.2 g/dL — ABNORMAL LOW (ref 13.0–17.0)
MCH: 24.9 pg — ABNORMAL LOW (ref 26.0–34.0)
MCHC: 31.4 g/dL (ref 30.0–36.0)
MCV: 79.4 fL — ABNORMAL LOW (ref 80.0–100.0)
Platelets: 299 10*3/uL (ref 150–400)
RBC: 3.69 MIL/uL — ABNORMAL LOW (ref 4.22–5.81)
RDW: 16.1 % — ABNORMAL HIGH (ref 11.5–15.5)
WBC: 7.3 10*3/uL (ref 4.0–10.5)
nRBC: 0 % (ref 0.0–0.2)

## 2022-12-30 LAB — MAGNESIUM: Magnesium: 1.9 mg/dL (ref 1.7–2.4)

## 2022-12-30 MED ORDER — POLYSACCHARIDE IRON COMPLEX 150 MG PO CAPS: 150.0000 mg | ORAL_CAPSULE | Freq: Every day | ORAL | 1 refills | Status: DC

## 2022-12-30 MED ORDER — METOPROLOL SUCCINATE ER 50 MG PO TB24: 50.0000 mg | ORAL_TABLET | Freq: Every day | ORAL | 1 refills | Status: DC

## 2022-12-30 MED ORDER — PANTOPRAZOLE SODIUM 40 MG PO TBEC: 40.0000 mg | DELAYED_RELEASE_TABLET | Freq: Every day | ORAL | 1 refills | Status: DC

## 2022-12-30 MED ORDER — CYANOCOBALAMIN 1000 MCG PO TABS: 1000.0000 ug | ORAL_TABLET | Freq: Every day | ORAL | 1 refills | Status: AC

## 2022-12-30 NOTE — Discharge Summary (Signed)
[facility] Discharge Summary   Patient: Tyler Pena ZOX:[id number]  PCP: Marisue Ivan, MD  Date of admission: 12/26/2022   Date of discharge:  12/30/2022     Discharge Diagnoses:  Principal Problem:   Small bowel obstruction (HCC) Active Problems:   Essential hypertension   GERD without esophagitis   BPH (benign prostatic hyperplasia)   Admitted From: ALF Disposition:  ALF/ILF   Recommendations for Outpatient Follow-up:  PCP: in 1 wk F/u with general surgery as needed Follow up LABS/TEST:  CBC    Follow-up Information     Marisue Ivan, MD Follow up in 1 week(s).   Specialty: Family Medicine Why: CBC in 1 wk Contact information: [address] [facility] [place] [address] [phone]                Diet recommendation: Cardiac diet  Activity: The patient is advised to gradually reintroduce usual activities, as tolerated  Discharge Condition: stable  Code Status: DNR   History of present illness: As per the H and P dictated on admission Hospital Course:  Tyler Pena is a 89 y.o. Caucasian male with medical history significant for essential hypertension, dyslipidemia, GERD, type diabetes mellitus and coronary artery disease status post PCI and stents, who presented to the emergency room with acute onset of abdominal pain that started after dinner with associated nausea without vomiting.  He noticed abdominal distention  Abdominal and pelvic CT scan revealed the following: 1. Small-bowel obstruction with fluid backup into and distention of the stomach. There is a subcecal transitional distal ileal segment suggesting the etiology may be adhesions. 2. Mesenteric congestive changes in the anterior mid abdomen, similar to the 2018 study. No bowel pneumatosis or portal venous gas. 3. Scattered fluid in the mesenteric folds in the mid to lower abdomen. No free air. 4. Diverticulosis without evidence of diverticulitis. 5. Aortic and  coronary artery atherosclerosis. 6. Small loculated posterolateral right pleural effusion with adjacent pleural-parenchymal opacities most likely due to atelectasis, not seen in 2018. 7. Prostatomegaly. 8.  Aortic atherosclerosis.   Assessment and Plan: # Recurrent SBO (small bowel obstruction) history of large ventral hernia S/p NG tube insertion and low suction. SBO resolved, diet was resumed, IV fluid discontinued. General surgery consult with Dr Harrison Mons conservative management. On 7/2 started having bowel movement, NG fell out. Overall feels better. Started on clear liquid diet by surgery. On 7/3 started soft diet. Patient was cleared by GI to discharge home and follow-up as an outpatient.  # Anemia due to iron deficiency and B12 at lower end. Hb 9.2, FOBT positive, possible intermittent GI bleeding Patient denies any frank GI bleeding.  Patient does not wanted GI consult for EGD and colonoscopy.  Wanted conservative management.  Started pantoprazole 40 mg p.o. daily.  Avoid NSAIDs, discontinued ibuprofen prn from home medications. # Iron deficiency, transferrin saturation 10%, s/p Venofer 200 mg IV daily for 5 days followed by oral supplement on discharge. Follow-up with PCP to repeat iron profile after 3 to 6 months # Vitamin B12 level 242, goal >400, s/p vitamin B12 1000 mcg IM injection daily during hospital stay followed by oral supplement on discharge.  Follow with PCP to repeat vitamin B12 level after 3 to 6 months. # Isotonic hyponatremia, most likely due to nutritional deficiency. Serum osmolality 279 within normal range. Na 133 today. # BPH (benign prostatic hyperplasia) continued Proscar and Flomax. # GERD without esophagitis, started pantoprazole 40 mg p.o. daily. F/u with GI prn # Essential  hypertension: Continue Toprol-XL 50 mg p.o. daily # H/o Lewy body dementia (per dter) on exelon, follows with Dr Sherryll Burger   Body mass index is 29.67 kg/m.  Nutrition Interventions:  Patient  was ambulatory without any assistance. On the day of the discharge the patient's vitals were stable, and no other acute medical condition were reported by patient. the patient was felt safe to be discharge at ALF/ILF.  Consultants: General surgery Procedures: None  Discharge Exam: General: Appear in no distress, no Rash; Oral Mucosa Clear, moist. Cardiovascular: S1 and S2 Present, no Murmur, Respiratory: normal respiratory effort, Bilateral Air entry present and no Crackles, no wheezes Abdomen: Bowel Sound present, Soft and no tenderness, no hernia Extremities: no Pedal edema, no calf tenderness Neurology: alert and oriented to time, place, and person affect appropriate.  Filed Weights   12/26/22 0700  Weight: 93.8 kg   Vitals:   12/30/22 0403 12/30/22 0716  BP: (!) 116/41 (!) 141/61  Pulse: 64 63  Resp: 16 12  Temp: 98.5 F (36.9 C) 97.8 F (36.6 C)  SpO2: 95% 93%    DISCHARGE MEDICATION: Allergies as of 12/30/2022       Reactions   Fenofibrate    Other reaction(s): Muscle Pain   Losartan Other (See Comments)   Caused weakness, chest pressure, and shortness of breath        Medication List     STOP taking these medications    Black Pepper-Turmeric 3-500 MG Caps   ferrous sulfate 325 (65 FE) MG EC tablet   ibuprofen 200 MG tablet Commonly known as: ADVIL   metoprolol tartrate 25 MG tablet Commonly known as: LOPRESSOR   mupirocin cream 2 % Commonly known as: BACTROBAN       TAKE these medications    acetaminophen 500 MG tablet Commonly known as: TYLENOL Take by mouth.   Ascorbic Acid 500 MG Caps Take 500 mg by mouth daily.   aspirin EC 81 MG tablet Take 81 mg by mouth daily.   Cholecalciferol 50 MCG (2000 UT) Tabs Take 1 tablet by mouth daily.   Cinnamon 500 MG capsule Take 1,000 mg by mouth in the morning and at bedtime.   CINNAMON PLUS CHROMIUM PO Take 1 tablet by mouth 2 (two) times daily. 1000mg  cinnamon/  250mg  chromium   Coenzyme  Q10 100 MG capsule Take 100 mg by mouth daily.   cyanocobalamin 1000 MCG tablet Take 1 tablet (1,000 mcg total) by mouth daily. Start taking on: January 05, 2023   diphenhydrAMINE 25 mg capsule Commonly known as: BENADRYL Take 25 mg by mouth every 6 (six) hours as needed.   finasteride 5 MG tablet Commonly known as: PROSCAR Take 5 mg by mouth every evening.   furosemide 40 MG tablet Commonly known as: LASIX Take 40 mg by mouth daily as needed.   iron polysaccharides 150 MG capsule Commonly known as: NIFEREX Take 1 capsule (150 mg total) by mouth daily.   Melatonin Maximum Strength 5 MG Tabs Generic drug: melatonin Take 5 mg by mouth at bedtime as needed.   metoprolol succinate 50 MG 24 hr tablet Commonly known as: TOPROL-XL Take 1 tablet (50 mg total) by mouth daily. Start taking on: December 31, 2022   multivitamin with minerals Tabs tablet Take 1 tablet by mouth daily.   pantoprazole 40 MG tablet Commonly known as: PROTONIX Take 1 tablet (40 mg total) by mouth daily. Start taking on: December 31, 2022   polyethylene glycol powder 17 GM/SCOOP powder Commonly  known as: GLYCOLAX/MIRALAX Take by mouth daily as needed.   potassium chloride SA 20 MEQ tablet Commonly known as: KLOR-CON M Take 20 mEq by mouth daily.   PRESERVISION AREDS 2 PO Take 1 tablet by mouth 2 (two) times daily.   rivastigmine 1.5 MG capsule Commonly known as: EXELON Take 1.5 mg by mouth 2 (two) times daily.   spironolactone 25 MG tablet Commonly known as: ALDACTONE Take 25 mg by mouth daily.   Super Omega 3 EPA/DHA 1000 MG Caps Take 1 capsule by mouth daily. Omega 3 - DHA - EPA - Fish oil-- 1000mg / 120mg / 180mg    tamsulosin 0.4 MG Caps capsule Commonly known as: FLOMAX Take 0.4 mg by mouth every evening.   traZODone 50 MG tablet Commonly known as: DESYREL Take 50 mg by mouth at bedtime.       Allergies  Allergen Reactions   Fenofibrate     Other reaction(s): Muscle Pain   Losartan  Other (See Comments)    Caused weakness, chest pressure, and shortness of breath   Discharge Instructions     Diet - low sodium heart healthy   Complete by: As directed    Discharge instructions   Complete by: As directed    F/u PCP in 1 wk, Repeat CBC in 1 wk F/u with Gen Surgery as needed   Increase activity slowly   Complete by: As directed        The results of significant diagnostics from this hospitalization (including imaging, microbiology, ancillary and laboratory) are listed below for reference.    Significant Diagnostic Studies: DG Abd 2 Views  Result Date: 12/27/2022 CLINICAL DATA:  SBO (small bowel obstruction) (HCC) EXAM: ABDOMEN - 2 VIEW COMPARISON:  Abdominal radiograph 12/26/2022. FINDINGS: Improved small bowel dilation with persistent air-fluid levels, compatible with known small bowel obstruction. No evidence of free air. Right-sided pleural effusion. Polyarticular degenerative change. IMPRESSION: Improved small bowel dilation with persistent air-fluid levels, compatible with known small bowel obstruction. Electronically Signed   By: Feliberto Harts M.D.   On: 12/27/2022 10:32   DG Abd Portable 1 View  Result Date: 12/26/2022 CLINICAL DATA:  89 year old male NG tube placement. EXAM: PORTABLE ABDOMEN - 1 VIEW COMPARISON:  CT Abdomen and Pelvis 0415 hours today. FINDINGS: Portable AP upright view at 0628 hours. Satisfactory enteric tube placement into the stomach, looping in the left upper quadrant. Visible bowel-gas pattern and lung bases are stable. IMPRESSION: Satisfactory enteric tube placement into the stomach. Electronically Signed   By: Odessa Fleming M.D.   On: 12/26/2022 06:58   CT ABDOMEN PELVIS W CONTRAST  Result Date: 12/26/2022 CLINICAL DATA:  Abdominal pain, nausea, and suspected bowel obstruction onset yesterday evening.There is a previous history of small-bowel obstruction. EXAM: CT ABDOMEN AND PELVIS WITH CONTRAST TECHNIQUE: Multidetector CT imaging of the  abdomen and pelvis was performed using the standard protocol following bolus administration of intravenous contrast. RADIATION DOSE REDUCTION: This exam was performed according to the departmental dose-optimization program which includes automated exposure control, adjustment of the mA and/or kV according to patient size and/or use of iterative reconstruction technique. CONTRAST:  OMNIPAQUE IOHEXOL 300 MG/ML  SOLN COMPARISON:  CT with IV contrast 11/07/2016. FINDINGS: Lower chest: Again noted is distention of the distal thoracic esophagus, small hiatal hernia, and refluxed fluid in the distal esophagus, seen previously. There is mild cardiomegaly. Coronary artery calcifications are noted. No pericardial effusion. There is new demonstration of a small loculated posterolateral right pleural effusion. There are adjacent pleural-parenchymal  opacities which are most likely due to atelectasis in the right middle and lower lobes. The left lung base remains clear. Hepatobiliary: The liver is 18 cm length with mild steatosis without evidence of mass. Again noted surgical absence of gallbladder without biliary dilatation. Pancreas: Partially atrophic, otherwise unremarkable.  Unchanged. Spleen: Slightly prominent, 13.2 cm length. No focal abnormality. Splenic artery is heavily calcified. Adrenals/Urinary Tract: There is no adrenal mass. Bilateral simple renal cysts are again noted and do not require imaging follow-up. There is no mass enhancement. There is no urinary stone or obstruction. The inferior aspect of the bladder is obscured by spray artifact from a left hip replacement. The visualized bladder is normal in thickness. Stomach/Bowel: There is small bowel dilatation to 3.6 cm with fluid backup into and distention of the stomach. There is a subcecal transitional distal ileal segment on 2:57 suggesting the etiology may be adhesions. In the anterior mid abdomen mild mesenteric congestive changes are similar to the  2018 study which also showed a small bowel obstruction, but at that time with a more abrupt transition and more medially located than the transitional segment on today's exam. An appendix is not seen in this patient. No bowel pneumatosis or portal venous gas are identified. There is left colonic diverticulosis without evidence of diverticulitis, with the most advanced diverticular disease in the sigmoid. Vascular/Lymphatic: Aortic atherosclerosis. No enlarged abdominal or pelvic lymph nodes. Reproductive: Enlarged prostate again noted measuring 5.6 cm. Other: There's scattered fluid in the mesenteric folds in the mid to lower abdomen. No other ascites is seen. There is no free air, free hemorrhage or abscess. Broad-based abdominal wall hernia repairs intact and was seen previously. There are small inguinal fat hernias. Musculoskeletal: Old left hip replacement. Osteopenia and degenerative change of the spine, moderate right hip DJD. No acute osseous abnormality or aggressive lesions. IMPRESSION: 1. Small-bowel obstruction with fluid backup into and distention of the stomach. There is a subcecal transitional distal ileal segment suggesting the etiology may be adhesions. 2. Mesenteric congestive changes in the anterior mid abdomen, similar to the 2018 study. No bowel pneumatosis or portal venous gas. 3. Scattered fluid in the mesenteric folds in the mid to lower abdomen. No free air. 4. Diverticulosis without evidence of diverticulitis. 5. Aortic and coronary artery atherosclerosis. 6. Small loculated posterolateral right pleural effusion with adjacent pleural-parenchymal opacities most likely due to atelectasis, not seen in 2018. 7. Prostatomegaly. Aortic Atherosclerosis (ICD10-I70.0). Electronically Signed   By: Almira Bar M.D.   On: 12/26/2022 05:40   DG Abdomen Acute W/Chest  Result Date: 12/26/2022 CLINICAL DATA:  89 year old male with nausea, distended abdomen. Suspected bowel obstruction. EXAM: DG  ABDOMEN ACUTE WITH 1 VIEW CHEST COMPARISON:  Portable chest 04/22/2021 and earlier. FINDINGS: Portable AP upright view of the chest at 0357 hours. Stable lung volumes and mediastinal contours. Chronic right pleural effusion or pleural scarring appears unchanged from 2 years ago. No superimposed pneumothorax or pneumoperitoneum. No acute pulmonary opacity. Chronic right rib fractures. AP portable upright and supine views of the abdomen and pelvis. No pneumoperitoneum. Previous ventral abdominal hernia repair with mesh. No bowel air-fluid levels on the upright view. On the supine views gas-filled mid abdominal small bowel loops are at the upper limits of normal. Large bowel gas is present to the rectum in nondilated colon. The appearance does not strongly suggest a bowel obstruction. Left hip arthroplasty. Degeneration in the spine. No acute osseous abnormality identified. IMPRESSION: 1. Radiographic bowel-gas pattern is not strongly suggestive of  bowel obstruction and no pneumoperitoneum is identified. 2. No acute cardiopulmonary abnormality. Chronic right pleural effusion or pleural scarring stable since 2022, with underlying chronic right rib fractures. Electronically Signed   By: Odessa Fleming M.D.   On: 12/26/2022 04:10    Microbiology: No results found for this or any previous visit (from the past 240 hour(s)).   Labs: CBC: Recent Labs  Lab 12/26/22 0200 12/27/22 0414 12/28/22 0529 12/28/22 1611 12/29/22 0641 12/29/22 1654 12/30/22 0445  WBC 11.4* 11.2* 8.1  --  7.0  --  7.3  NEUTROABS 7.9*  --   --   --   --   --   --   HGB 11.4* 9.7* 8.7* 9.8* 9.4* 10.2* 9.2*  HCT 36.3* 31.0* 28.4* 32.0* 30.4* 32.5* 29.3*  MCV 78.9* 79.1* 80.7  --  79.8*  --  79.4*  PLT 337 311 268  --  207  --  299   Basic Metabolic Panel: Recent Labs  Lab 12/26/22 0200 12/27/22 0414 12/28/22 0951 12/29/22 0641 12/30/22 0445  NA 133* 132* 131* 130* 133*  K 4.1 3.7 3.8 3.5 3.7  CL 99 101 101 100 100  CO2 25 23 23 23  25   GLUCOSE 179* 141* 158* 124* 119*  BUN 15 17 14 11 10   CREATININE 0.97 0.78 0.72 0.80 0.81  CALCIUM 9.0 8.1* 7.9* 7.9* 8.2*  MG  --   --  2.1 1.9 1.9  PHOS  --   --  2.4* 2.5 2.8   Liver Function Tests: Recent Labs  Lab 12/26/22 0200  AST 18  ALT 13  ALKPHOS 63  BILITOT 0.4  PROT 6.7  ALBUMIN 3.3*   Recent Labs  Lab 12/26/22 0200  LIPASE 27   No results for input(s): "AMMONIA" in the last 168 hours. Cardiac Enzymes: No results for input(s): "CKTOTAL", "CKMB", "CKMBINDEX", "TROPONINI" in the last 168 hours. BNP (last 3 results) No results for input(s): "BNP" in the last 8760 hours. CBG: No results for input(s): "GLUCAP" in the last 168 hours.  Time spent: 35 minutes  Signed:  Gillis Santa  [facility] 12/30/2022 11:03 AM

## 2022-12-30 NOTE — Plan of Care (Signed)
Patient alert and orientated x4. Hemoglobin has been stable. Vital sign with in normal limit. PIV removed. Discharge home with sl;ef care.    Problem: Education: Goal: Knowledge of General Education information will improve Description: Including pain rating scale, medication(s)/side effects and non-pharmacologic comfort measures Outcome: Completed/Met   Problem: Health Behavior/Discharge Planning: Goal: Ability to manage health-related needs will improve Outcome: Completed/Met   Problem: Clinical Measurements: Goal: Ability to maintain clinical measurements within normal limits will improve Outcome: Completed/Met Goal: Will remain free from infection Outcome: Completed/Met Goal: Diagnostic test results will improve Outcome: Completed/Met Goal: Respiratory complications will improve Outcome: Completed/Met Goal: Cardiovascular complication will be avoided Outcome: Completed/Met   Problem: Activity: Goal: Risk for activity intolerance will decrease Outcome: Completed/Met   Problem: Nutrition: Goal: Adequate nutrition will be maintained Outcome: Completed/Met   Problem: Coping: Goal: Level of anxiety will decrease Outcome: Completed/Met   Problem: Elimination: Goal: Will not experience complications related to bowel motility Outcome: Completed/Met Goal: Will not experience complications related to urinary retention Outcome: Completed/Met   Problem: Pain Managment: Goal: General experience of comfort will improve Outcome: Completed/Met   Problem: Safety: Goal: Ability to remain free from injury will improve Outcome: Completed/Met   Problem: Skin Integrity: Goal: Risk for impaired skin integrity will decrease Outcome: Completed/Met

## 2022-12-30 NOTE — TOC Transition Note (Signed)
Transition of Care [facility]) - CM/SW Discharge Note   Patient Details  Name: Tyler Pena MRN: [id number] Date of Birth: 10/14/1920  [facility]) CM/SW Contact:  Allena Katz, LCSW Phone Number: 12/30/2022, 1:48 PM   Clinical Narrative:   pt discharging back to [facility] ALF. Fl2 and DC summary completed. Daughter to transport pt back to facility.           Patient Goals and CMS Choice      Discharge Placement                         Discharge Plan and Services Additional resources added to the After Visit Summary for                                       Social Determinants of Health (SDOH) Interventions SDOH Screenings   Food Insecurity: No Food Insecurity (12/26/2022)  Housing: Low Risk  (12/26/2022)  Transportation Needs: No Transportation Needs (12/26/2022)  Utilities: Not At Risk (12/26/2022)  Tobacco Use: Low Risk  (12/26/2022)     Readmission Risk Interventions     No data to display

## 2022-12-30 NOTE — [place] FL2 (Signed)
[place] MEDICAID FL2 LEVEL OF CARE FORM     IDENTIFICATION  Patient Name: Tyler Pena Birthdate: December 31, 1920 Sex: male Admission Date (Current Location): 12/26/2022  [facility] and [place] Number:  Chiropodist and Address:  [facility], [address] [place], [place] [address]      Provider Number: [id number]  Attending Physician Name and Address:  Gillis Santa, MD  Relative Name and Phone Number:       Current Level of Care:   Recommended Level of Care: Assisted Living Facility (memory care) Prior Approval Number:    Date Approved/Denied:   PASRR Number:    Discharge Plan: Domiciliary (Rest home)    Current Diagnoses: Patient Active Problem List   Diagnosis Date Noted   BPH (benign prostatic hyperplasia) 12/26/2022   Pleural effusion 07/02/2020   1st degree AV block 09/17/2018   Diabetes mellitus type 2, diet-controlled (HCC) 11/28/2016   Small bowel obstruction (HCC) 11/07/2016   Partial small bowel obstruction (HCC) 10/28/2016   Carcinoma (HCC) 10/12/2016   Chest pain, rule out acute myocardial infarction 10/07/2016   Bladder tumor 09/02/2016   Benign prostatic hyperplasia with weak urinary stream 08/18/2016   Coronary artery disease involving native coronary artery of native heart without angina pectoris 08/18/2016   Essential hypertension 08/18/2016   GERD without esophagitis 08/18/2016    Orientation RESPIRATION BLADDER Height & Weight     Self, Time, Situation, Place  Normal Continent Weight: 206 lb 12.7 oz (93.8 kg) Height:  5\' 10"  (177.8 cm)  BEHAVIORAL SYMPTOMS/MOOD NEUROLOGICAL BOWEL NUTRITION STATUS      Continent Diet  AMBULATORY STATUS COMMUNICATION OF NEEDS Skin     Verbally Normal                       Personal Care Assistance Level of Assistance              Functional Limitations Info  Sight, Hearing, Speech Sight Info: Impaired Hearing Info: Adequate Speech Info: Adequate    SPECIAL CARE  FACTORS FREQUENCY                       Contractures Contractures Info: Not present    Additional Factors Info  Code Status, Allergies Code Status Info: DNR Allergies Info: Fenofibrate  Losartan (Fenofibrate  Losartan)           Current Medications (12/30/2022):  This is the current hospital active medication list Current Facility-Administered Medications  Medication Dose Route Frequency Provider Last Rate Last Admin   acetaminophen (TYLENOL) tablet 650 mg  650 mg Oral Q6H PRN Mansy, Jan A, MD       Or   acetaminophen (TYLENOL) suppository 650 mg  650 mg Rectal Q6H PRN Mansy, Jan A, MD       cyanocobalamin (VITAMIN B12) injection 1,000 mcg  1,000 mcg Intramuscular Q1200 Gillis Santa, MD   1,000 mcg at 12/30/22 1119   Followed by   Melene Muller ON 01/05/2023] cyanocobalamin (VITAMIN B12) tablet 1,000 mcg  1,000 mcg Oral Daily Gillis Santa, MD       finasteride (PROSCAR) tablet 5 mg  5 mg Oral QPM Mansy, Jan A, MD   5 mg at 12/29/22 1724   hydrALAZINE (APRESOLINE) injection 10 mg  10 mg Intravenous Q6H PRN Enedina Finner, MD       iron sucrose (VENOFER) 200 mg in sodium chloride 0.9 % 100 mL IVPB  200 mg Intravenous Daily Gillis Santa, MD  440 mL/hr at 12/30/22 0923 200 mg at 12/30/22 0923   metoprolol succinate (TOPROL-XL) 24 hr tablet 25 mg  25 mg Oral Daily Enedina Finner, MD   25 mg at 12/30/22 0902   multivitamin with minerals tablet 1 tablet  1 tablet Oral Daily Mansy, Jan A, MD   1 tablet at 12/30/22 0902   ondansetron [facility]) tablet 4 mg  4 mg Oral Q6H PRN Mansy, Jan A, MD       Or   ondansetron [facility]) injection 4 mg  4 mg Intravenous Q6H PRN Mansy, Jan A, MD       pantoprazole (PROTONIX) EC tablet 40 mg  40 mg Oral Daily Donovan Kail, PA-C   40 mg at 12/30/22 0902   rivastigmine (EXELON) capsule 1.5 mg  1.5 mg Oral BID Enedina Finner, MD   1.5 mg at 12/30/22 1610   spironolactone (ALDACTONE) tablet 25 mg  25 mg Oral Daily Enedina Finner, MD   25 mg at 12/30/22 0902   tamsulosin  (FLOMAX) capsule 0.4 mg  0.4 mg Oral QPM Mansy, Jan A, MD   0.4 mg at 12/29/22 1724   traZODone (DESYREL) tablet 25 mg  25 mg Oral QHS PRN Mansy, Jan A, MD   25 mg at 12/26/22 2108     Discharge Medications: Please see discharge summary for a list of discharge medications.   STOP taking these medications     Black Pepper-Turmeric 3-500 MG Caps    ferrous sulfate 325 (65 FE) MG EC tablet    ibuprofen 200 MG tablet Commonly known as: ADVIL    metoprolol tartrate 25 MG tablet Commonly known as: LOPRESSOR    mupirocin cream 2 % Commonly known as: BACTROBAN           TAKE these medications     acetaminophen 500 MG tablet Commonly known as: TYLENOL Take by mouth.    Ascorbic Acid 500 MG Caps Take 500 mg by mouth daily.    aspirin EC 81 MG tablet Take 81 mg by mouth daily.    Cholecalciferol 50 MCG (2000 UT) Tabs Take 1 tablet by mouth daily.    Cinnamon 500 MG capsule Take 1,000 mg by mouth in the morning and at bedtime.    CINNAMON PLUS CHROMIUM PO Take 1 tablet by mouth 2 (two) times daily. 1000mg  cinnamon/  250mg  chromium    Coenzyme Q10 100 MG capsule Take 100 mg by mouth daily.    cyanocobalamin 1000 MCG tablet Take 1 tablet (1,000 mcg total) by mouth daily. Start taking on: January 05, 2023    diphenhydrAMINE 25 mg capsule Commonly known as: BENADRYL Take 25 mg by mouth every 6 (six) hours as needed.    finasteride 5 MG tablet Commonly known as: PROSCAR Take 5 mg by mouth every evening.    furosemide 40 MG tablet Commonly known as: LASIX Take 40 mg by mouth daily as needed.    iron polysaccharides 150 MG capsule Commonly known as: NIFEREX Take 1 capsule (150 mg total) by mouth daily.    Melatonin Maximum Strength 5 MG Tabs Generic drug: melatonin Take 5 mg by mouth at bedtime as needed.    metoprolol succinate 50 MG 24 hr tablet Commonly known as: TOPROL-XL Take 1 tablet (50 mg total) by mouth daily. Start taking on: December 31, 2022     multivitamin with minerals Tabs tablet Take 1 tablet by mouth daily.    pantoprazole 40 MG tablet Commonly known as: PROTONIX Take 1  tablet (40 mg total) by mouth daily. Start taking on: December 31, 2022    polyethylene glycol powder 17 GM/SCOOP powder Commonly known as: GLYCOLAX/MIRALAX Take by mouth daily as needed.    potassium chloride SA 20 MEQ tablet Commonly known as: KLOR-CON M Take 20 mEq by mouth daily.    PRESERVISION AREDS 2 PO Take 1 tablet by mouth 2 (two) times daily.    rivastigmine 1.5 MG capsule Commonly known as: EXELON Take 1.5 mg by mouth 2 (two) times daily.    spironolactone 25 MG tablet Commonly known as: ALDACTONE Take 25 mg by mouth daily.    Super Omega 3 EPA/DHA 1000 MG Caps Take 1 capsule by mouth daily. Omega 3 - DHA - EPA - Fish oil-- 1000mg / 120mg / 180mg     tamsulosin 0.4 MG Caps capsule Commonly known as: FLOMAX Take 0.4 mg by mouth every evening.    traZODone 50 MG tablet Commonly known as: DESYREL Take 50 mg by mouth at bedtime.       Relevant Imaging Results:  Relevant Lab Results:   Additional Information SS [id number]  Allena Katz, LCSW

## 2023-06-11 ENCOUNTER — Emergency Department: Payer: Medicare Other

## 2023-06-11 DIAGNOSIS — I440 Atrioventricular block, first degree: Secondary | ICD-10-CM | POA: Diagnosis present

## 2023-06-11 DIAGNOSIS — Z86006 Personal history of melanoma in-situ: Secondary | ICD-10-CM

## 2023-06-11 DIAGNOSIS — E785 Hyperlipidemia, unspecified: Secondary | ICD-10-CM | POA: Diagnosis present

## 2023-06-11 DIAGNOSIS — R3912 Poor urinary stream: Secondary | ICD-10-CM | POA: Diagnosis present

## 2023-06-11 DIAGNOSIS — D72829 Elevated white blood cell count, unspecified: Secondary | ICD-10-CM | POA: Diagnosis present

## 2023-06-11 DIAGNOSIS — Z7982 Long term (current) use of aspirin: Secondary | ICD-10-CM

## 2023-06-11 DIAGNOSIS — K56600 Partial intestinal obstruction, unspecified as to cause: Secondary | ICD-10-CM | POA: Diagnosis not present

## 2023-06-11 DIAGNOSIS — K5651 Intestinal adhesions [bands], with partial obstruction: Secondary | ICD-10-CM | POA: Diagnosis not present

## 2023-06-11 DIAGNOSIS — Z8711 Personal history of peptic ulcer disease: Secondary | ICD-10-CM

## 2023-06-11 DIAGNOSIS — F03A0 Unspecified dementia, mild, without behavioral disturbance, psychotic disturbance, mood disturbance, and anxiety: Secondary | ICD-10-CM | POA: Diagnosis present

## 2023-06-11 DIAGNOSIS — Z79899 Other long term (current) drug therapy: Secondary | ICD-10-CM

## 2023-06-11 DIAGNOSIS — Z888 Allergy status to other drugs, medicaments and biological substances status: Secondary | ICD-10-CM

## 2023-06-11 DIAGNOSIS — Z96642 Presence of left artificial hip joint: Secondary | ICD-10-CM | POA: Diagnosis present

## 2023-06-11 DIAGNOSIS — I252 Old myocardial infarction: Secondary | ICD-10-CM

## 2023-06-11 DIAGNOSIS — R109 Unspecified abdominal pain: Secondary | ICD-10-CM | POA: Diagnosis not present

## 2023-06-11 DIAGNOSIS — Z66 Do not resuscitate: Secondary | ICD-10-CM | POA: Diagnosis present

## 2023-06-11 DIAGNOSIS — F05 Delirium due to known physiological condition: Secondary | ICD-10-CM | POA: Diagnosis present

## 2023-06-11 DIAGNOSIS — E119 Type 2 diabetes mellitus without complications: Secondary | ICD-10-CM | POA: Diagnosis present

## 2023-06-11 DIAGNOSIS — E876 Hypokalemia: Secondary | ICD-10-CM | POA: Diagnosis present

## 2023-06-11 DIAGNOSIS — Z955 Presence of coronary angioplasty implant and graft: Secondary | ICD-10-CM

## 2023-06-11 DIAGNOSIS — K219 Gastro-esophageal reflux disease without esophagitis: Secondary | ICD-10-CM | POA: Diagnosis present

## 2023-06-11 DIAGNOSIS — E871 Hypo-osmolality and hyponatremia: Secondary | ICD-10-CM | POA: Diagnosis present

## 2023-06-11 DIAGNOSIS — Z9049 Acquired absence of other specified parts of digestive tract: Secondary | ICD-10-CM

## 2023-06-11 DIAGNOSIS — N401 Enlarged prostate with lower urinary tract symptoms: Secondary | ICD-10-CM | POA: Diagnosis present

## 2023-06-11 DIAGNOSIS — Z8249 Family history of ischemic heart disease and other diseases of the circulatory system: Secondary | ICD-10-CM

## 2023-06-11 DIAGNOSIS — F0390 Unspecified dementia without behavioral disturbance: Secondary | ICD-10-CM | POA: Diagnosis present

## 2023-06-11 DIAGNOSIS — I2510 Atherosclerotic heart disease of native coronary artery without angina pectoris: Secondary | ICD-10-CM | POA: Diagnosis present

## 2023-06-11 DIAGNOSIS — I10 Essential (primary) hypertension: Secondary | ICD-10-CM | POA: Diagnosis present

## 2023-06-11 DIAGNOSIS — Z8551 Personal history of malignant neoplasm of bladder: Secondary | ICD-10-CM

## 2023-06-11 DIAGNOSIS — Z1152 Encounter for screening for COVID-19: Secondary | ICD-10-CM

## 2023-06-11 LAB — COMPREHENSIVE METABOLIC PANEL
ALT: 13 U/L (ref 0–44)
AST: 19 U/L (ref 15–41)
Albumin: 3.8 g/dL (ref 3.5–5.0)
Alkaline Phosphatase: 76 U/L (ref 38–126)
Anion gap: 7 (ref 5–15)
BUN: 17 mg/dL (ref 8–23)
CO2: 29 mmol/L (ref 22–32)
Calcium: 9.3 mg/dL (ref 8.9–10.3)
Chloride: 92 mmol/L — ABNORMAL LOW (ref 98–111)
Creatinine, Ser: 0.84 mg/dL (ref 0.61–1.24)
GFR, Estimated: >60 mL/min (ref >60)
Glucose, Bld: 153 mg/dL — ABNORMAL HIGH (ref 70–99)
Potassium: 4.4 mmol/L (ref 3.5–5.1)
Sodium: 128 mmol/L — ABNORMAL LOW (ref 135–145)
Total Bilirubin: 0.9 mg/dL (ref <1.2)
Total Protein: 7.3 g/dL (ref 6.5–8.1)

## 2023-06-11 LAB — CBC
HCT: 41.1 % (ref 39.0–52.0)
Hemoglobin: 14 g/dL (ref 13.0–17.0)
MCH: 30.7 pg (ref 26.0–34.0)
MCHC: 34.1 g/dL (ref 30.0–36.0)
MCV: 90.1 fL (ref 80.0–100.0)
Platelets: 328 10*3/uL (ref 150–400)
RBC: 4.56 MIL/uL (ref 4.22–5.81)
RDW: 13.6 % (ref 11.5–15.5)
WBC: 13.9 10*3/uL — ABNORMAL HIGH (ref 4.0–10.5)
nRBC: 0 % (ref 0.0–0.2)

## 2023-06-11 LAB — LIPASE, BLOOD: Lipase: 26 U/L (ref 11–51)

## 2023-06-11 MED ORDER — IOHEXOL 300 MG/ML  SOLN
100.0000 mL | Freq: Once | INTRAMUSCULAR | Status: AC | PRN
  Administered 2023-06-11: 100 mL via INTRAVENOUS

## 2023-06-11 NOTE — ED Triage Notes (Signed)
Pt to ed from [facility] via [facility] for abd pain. Pt has obvious distension. Pt has HX of bowel obstruction. Pt had last BM this morning and was normal. Pt takes iron, no thinners. Pt has urge to go but hasn't been able to go since. Pt is caox4, in no acute distress in triage.   80 HR 170BGL 142/84 98%

## 2023-06-11 NOTE — ED Provider Notes (Signed)
Emergency Medicine Provider Triage Evaluation Note  Tyler Pena , a 89 y.o. male  was evaluated in triage.  Pt complains of abdominal pain with increasing distention, nausea, and vomiting since this morning.  Patient describes symptoms as similar to prior bowel obstructions..  Review of Systems  Positive: Abdominal pain, nausea, vomiting, constipation. Negative: Fever, dysuria, flank pain, chest pain.  Physical Exam  BP 126/70 (BP Location: Left Arm)   Pulse 87   Temp 98.1 F (36.7 C)   Resp 18   SpO2 94%  Gen:   Awake, no distress Resp:  Normal effort MSK:   Moves extremities without difficulty Other:  Abdomen distended with diffuse tenderness to palpation.  Medical Decision Making  Medically screening exam initiated at 10:30 PM.  Appropriate orders placed.  Tyler Pena was informed that the remainder of the evaluation will be completed by another provider, this initial triage assessment does not replace that evaluation, and the importance of remaining in the ED until their evaluation is complete.   Chesley Noon, MD 06/11/23 2231

## 2023-06-12 ENCOUNTER — Inpatient Hospital Stay: Payer: Medicare Other

## 2023-06-12 ENCOUNTER — Inpatient Hospital Stay
Admission: EM | Admit: 2023-06-12 | Discharge: 2023-06-16 | DRG: 389 | Disposition: A | Discharge status: Skilled Nursing Facility | Payer: Medicare Other | Source: Skilled Nursing Facility | Attending: Obstetrics and Gynecology | Admitting: Obstetrics and Gynecology

## 2023-06-12 DIAGNOSIS — D72829 Elevated white blood cell count, unspecified: Secondary | ICD-10-CM | POA: Diagnosis present

## 2023-06-12 DIAGNOSIS — E876 Hypokalemia: Secondary | ICD-10-CM | POA: Diagnosis present

## 2023-06-12 DIAGNOSIS — R112 Nausea with vomiting, unspecified: Secondary | ICD-10-CM

## 2023-06-12 DIAGNOSIS — I2510 Atherosclerotic heart disease of native coronary artery without angina pectoris: Secondary | ICD-10-CM | POA: Diagnosis present

## 2023-06-12 DIAGNOSIS — E119 Type 2 diabetes mellitus without complications: Secondary | ICD-10-CM

## 2023-06-12 DIAGNOSIS — N401 Enlarged prostate with lower urinary tract symptoms: Secondary | ICD-10-CM | POA: Diagnosis present

## 2023-06-12 DIAGNOSIS — E785 Hyperlipidemia, unspecified: Secondary | ICD-10-CM | POA: Diagnosis present

## 2023-06-12 DIAGNOSIS — Z8711 Personal history of peptic ulcer disease: Secondary | ICD-10-CM | POA: Diagnosis not present

## 2023-06-12 DIAGNOSIS — E871 Hypo-osmolality and hyponatremia: Secondary | ICD-10-CM | POA: Insufficient documentation

## 2023-06-12 DIAGNOSIS — I252 Old myocardial infarction: Secondary | ICD-10-CM | POA: Diagnosis not present

## 2023-06-12 DIAGNOSIS — K219 Gastro-esophageal reflux disease without esophagitis: Secondary | ICD-10-CM | POA: Diagnosis present

## 2023-06-12 DIAGNOSIS — Z86006 Personal history of melanoma in-situ: Secondary | ICD-10-CM | POA: Diagnosis not present

## 2023-06-12 DIAGNOSIS — Z1152 Encounter for screening for COVID-19: Secondary | ICD-10-CM | POA: Diagnosis not present

## 2023-06-12 DIAGNOSIS — Z955 Presence of coronary angioplasty implant and graft: Secondary | ICD-10-CM | POA: Diagnosis not present

## 2023-06-12 DIAGNOSIS — R109 Unspecified abdominal pain: Secondary | ICD-10-CM | POA: Diagnosis present

## 2023-06-12 DIAGNOSIS — F0390 Unspecified dementia without behavioral disturbance: Secondary | ICD-10-CM | POA: Diagnosis present

## 2023-06-12 DIAGNOSIS — Z8551 Personal history of malignant neoplasm of bladder: Secondary | ICD-10-CM | POA: Diagnosis not present

## 2023-06-12 DIAGNOSIS — K56609 Unspecified intestinal obstruction, unspecified as to partial versus complete obstruction: Principal | ICD-10-CM | POA: Diagnosis present

## 2023-06-12 DIAGNOSIS — Z9049 Acquired absence of other specified parts of digestive tract: Secondary | ICD-10-CM | POA: Diagnosis not present

## 2023-06-12 DIAGNOSIS — Z96642 Presence of left artificial hip joint: Secondary | ICD-10-CM | POA: Diagnosis present

## 2023-06-12 DIAGNOSIS — F03A0 Unspecified dementia, mild, without behavioral disturbance, psychotic disturbance, mood disturbance, and anxiety: Secondary | ICD-10-CM | POA: Diagnosis present

## 2023-06-12 DIAGNOSIS — K5651 Intestinal adhesions [bands], with partial obstruction: Secondary | ICD-10-CM | POA: Diagnosis present

## 2023-06-12 DIAGNOSIS — Z888 Allergy status to other drugs, medicaments and biological substances status: Secondary | ICD-10-CM | POA: Diagnosis not present

## 2023-06-12 DIAGNOSIS — Z66 Do not resuscitate: Secondary | ICD-10-CM | POA: Diagnosis present

## 2023-06-12 DIAGNOSIS — Z7982 Long term (current) use of aspirin: Secondary | ICD-10-CM | POA: Diagnosis not present

## 2023-06-12 DIAGNOSIS — I10 Essential (primary) hypertension: Secondary | ICD-10-CM | POA: Diagnosis present

## 2023-06-12 DIAGNOSIS — Z8249 Family history of ischemic heart disease and other diseases of the circulatory system: Secondary | ICD-10-CM | POA: Diagnosis not present

## 2023-06-12 DIAGNOSIS — K56600 Partial intestinal obstruction, unspecified as to cause: Secondary | ICD-10-CM | POA: Diagnosis present

## 2023-06-12 DIAGNOSIS — F05 Delirium due to known physiological condition: Secondary | ICD-10-CM | POA: Diagnosis present

## 2023-06-12 LAB — URINALYSIS, ROUTINE W REFLEX MICROSCOPIC
Bilirubin Urine: NEGATIVE
Glucose, UA: NEGATIVE mg/dL
Hgb urine dipstick: NEGATIVE
Ketones, ur: NEGATIVE mg/dL
Leukocytes,Ua: NEGATIVE
Nitrite: NEGATIVE
Protein, ur: NEGATIVE mg/dL
Specific Gravity, Urine: >1.046 — ABNORMAL HIGH (ref 1.005–1.030)
pH: 5 (ref 5.0–8.0)

## 2023-06-12 LAB — GLUCOSE, CAPILLARY: Glucose-Capillary: 163 mg/dL — ABNORMAL HIGH (ref 70–99)

## 2023-06-12 MED ORDER — MORPHINE SULFATE (PF) 4 MG/ML IV SOLN
4.0000 mg | Freq: Once | INTRAVENOUS | Status: DC
  Filled 2023-06-12: qty 1

## 2023-06-12 MED ORDER — METOPROLOL TARTRATE 5 MG/5ML IV SOLN: 10.0000 mg | Freq: Four times a day (QID) | INTRAVENOUS | Status: DC | PRN

## 2023-06-12 MED ORDER — MORPHINE SULFATE (PF) 4 MG/ML IV SOLN
4.0000 mg | Freq: Once | INTRAVENOUS | Status: AC
  Administered 2023-06-12: 4 mg via INTRAVENOUS
  Filled 2023-06-12: qty 1

## 2023-06-12 MED ORDER — SODIUM CHLORIDE 0.9 % IV BOLUS
1000.0000 mL | Freq: Once | INTRAVENOUS | Status: AC
  Administered 2023-06-12: 1000 mL via INTRAVENOUS

## 2023-06-12 MED ORDER — ENOXAPARIN SODIUM 40 MG/0.4ML IJ SOSY
40.0000 mg | PREFILLED_SYRINGE | INTRAMUSCULAR | Status: DC
  Administered 2023-06-12 – 2023-06-14 (×3): 40 mg via SUBCUTANEOUS
  Filled 2023-06-12 (×3): qty 0.4

## 2023-06-12 MED ORDER — ONDANSETRON HCL 4 MG/2ML IJ SOLN
4.0000 mg | Freq: Once | INTRAMUSCULAR | Status: AC
  Administered 2023-06-12: 4 mg via INTRAVENOUS
  Filled 2023-06-12: qty 2

## 2023-06-12 MED ORDER — DEXTROSE-SODIUM CHLORIDE 5-0.45 % IV SOLN: INTRAVENOUS | Status: AC

## 2023-06-12 MED ORDER — ONDANSETRON HCL 4 MG/2ML IJ SOLN
4.0000 mg | Freq: Once | INTRAMUSCULAR | Status: AC
Start: 2023-06-12 — End: 2023-06-12
  Administered 2023-06-12: 4 mg via INTRAVENOUS
  Filled 2023-06-12: qty 2

## 2023-06-12 NOTE — ED Notes (Signed)
Walked into pt room to reassess and pt was sitting in recliner with NG tube in his lap, when I asked what happened pt did not even know tube had come out.

## 2023-06-12 NOTE — ED Notes (Signed)
Patient placed on portable monitor. CB secured to blanket and patient was educated on its use.

## 2023-06-12 NOTE — ED Provider Notes (Signed)
[facility] Provider Note    Event Date/Time   First MD Initiated Contact with Patient 06/12/23 0124     (approximate)   History   Abdominal Pain   HPI Kadeem Mckissic is a 89 y.o. male with prior history of bowel obstructions, chronic intra-abdominal adhesions, DM2 presenting today for abdominal pain.  Patient states he had onset of abdominal pain was in the past 24 hours.  Has noted distention of his abdomen.  Has had associated nausea with vomiting.  Last bowel movement was 24 hours ago.  Has been able to pass gas 1 time today.  Otherwise denies fevers, chills, chest pain, shortness of breath, dysuria.  Reviewed prior chart notes including surgical history with small bowel obstructions most recently in January of this year.  States symptoms usually improve after NG insertion.  Reviewed most recent admission in July of this year for small bowel obstruction.     Physical Exam   Triage Vital Signs: ED Triage Vitals  Encounter Vitals Group     BP 06/11/23 2159 126/70     Systolic BP Percentile --      Diastolic BP Percentile --      Pulse Rate 06/11/23 2159 87     Resp 06/11/23 2159 18     Temp 06/11/23 2159 98.1 F (36.7 C)     Temp Source 06/12/23 0159 Oral     SpO2 06/11/23 2159 94 %     Weight 06/12/23 0200 210 lb (95.3 kg)     Height 06/12/23 0200 5\' 11"  (1.803 m)     Head Circumference --      Peak Flow --      Pain Score 06/11/23 2159 4     Pain Loc --      Pain Education --      Exclude from Growth Chart --     Most recent vital signs: Vitals:   06/12/23 0438 06/12/23 0516  BP: 117/69 (!) 153/84  Pulse: 87 83  Resp: 20 18  Temp: (!) 97.4 F (36.3 C) 98.3 F (36.8 C)  SpO2: 96% 95%   Physical Exam: I have reviewed the vital signs and nursing notes. General: Awake, alert, no acute distress.  Nontoxic appearing. Head:  Atraumatic, normocephalic.   ENT:  EOM intact, PERRL. Oral mucosa is pink and moist with no lesions. Neck: Neck  is supple with full range of motion, No meningeal signs. Cardiovascular:  RRR, No murmurs. Peripheral pulses palpable and equal bilaterally. Respiratory:  Symmetrical chest wall expansion.  No rhonchi, rales, or wheezes.  Good air movement throughout.  No use of accessory muscles.   Musculoskeletal:  No cyanosis or edema. Moving extremities with full ROM Abdomen:  Soft, abdominal distention with tenderness palpation throughout, nondistended. Neuro:  GCS 15, moving all four extremities, interacting appropriately. Speech clear. Psych:  Calm, appropriate.   Skin:  Warm, dry, no rash.    ED Results / Procedures / Treatments   Labs (all labs ordered are listed, but only abnormal results are displayed) Labs Reviewed  COMPREHENSIVE METABOLIC PANEL - Abnormal; Notable for the following components:      Result Value   Sodium 128 (*)    Chloride 92 (*)    Glucose, Bld 153 (*)    All other components within normal limits  CBC - Abnormal; Notable for the following components:   WBC 13.9 (*)    All other components within normal limits  URINALYSIS, ROUTINE W REFLEX MICROSCOPIC - Abnormal;  Notable for the following components:   Color, Urine YELLOW (*)    APPearance CLEAR (*)    Specific Gravity, Urine >1.046 (*)    All other components within normal limits  LIPASE, BLOOD     EKG My EKG interpretation: Rate of 86, normal sinus rhythm with prolonged PR interval at 276.  Normal axis.  No acute ST elevations or depressions   RADIOLOGY Independently interpreted CT imaging with concern for ileus versus possible early/partial small bowel obstruction   PROCEDURES:  Critical Care performed: No  Procedures   MEDICATIONS ORDERED IN ED: Medications  morphine (PF) 4 MG/ML injection 4 mg (4 mg Intravenous Not Given 06/12/23 0552)  iohexol (OMNIPAQUE) 300 MG/ML solution 100 mL (100 mLs Intravenous Contrast Given 06/11/23 2257)  ondansetron (ZOFRAN) injection 4 mg (4 mg Intravenous Given  06/12/23 0152)  morphine (PF) 4 MG/ML injection 4 mg (4 mg Intravenous Given 06/12/23 0152)  sodium chloride 0.9 % bolus 1,000 mL (1,000 mLs Intravenous New Bag/Given 06/12/23 0211)  ondansetron (ZOFRAN) injection 4 mg (4 mg Intravenous Given 06/12/23 0442)     IMPRESSION / MDM / ASSESSMENT AND PLAN / ED COURSE  I reviewed the triage vital signs and the nursing notes.                              Differential diagnosis includes, but is not limited to, small bowel obstruction, pancreatitis, diverticulitis, appendicitis, ileus  Patient's presentation is most consistent with acute complicated illness / injury requiring diagnostic workup.  Patient is 89 year old male with prior histories of small bowel obstruction presenting today for abdominal pain and distention with nausea and vomiting.  Vital signs stable.  Patient dosed with pain and nausea medication multiple times in the ED.  Was found to have concern for ileus versus early/partial small bowel obstruction seen on CT. patient required multiple doses of pain and nausea medication while in the ED.  Discussed case with general surgery, Dr. Aleen Campi.  Does recommend admission with NG tube placement and further observation given significant prior history.  Patient admitted to hospitalist for further care.  The patient is on the cardiac monitor to evaluate for evidence of arrhythmia and/or significant heart rate changes. Clinical Course as of 06/12/23 0606  Mon Jun 12, 2023  1324 Spoke with Dr. Aleen Campi with general surgery. Recs admission, NG tube, they will follow [DW]    Clinical Course User Index [DW] Janith Lima, MD     FINAL CLINICAL IMPRESSION(S) / ED DIAGNOSES   Final diagnoses:  Nausea and vomiting, unspecified vomiting type  Small bowel obstruction (HCC)     Rx / DC Orders   ED Discharge Orders     None        Note:  This document was prepared using Dragon voice recognition software and may include unintentional  dictation errors.   Janith Lima, MD 06/12/23 [phone]

## 2023-06-12 NOTE — Consult Note (Signed)
[facility] SURGICAL CONSULTATION NOTE (initial) - cpt: 42595   HISTORY OF PRESENT ILLNESS (HPI):  89 y.o. male presented to [facility] ED today for evaluation of abdominal pain. Patient reports around 24 hour history of abdominal distension and discomfort. This was accompanied with nausea and burping. No fever, chills, CP, SOB, emesis, or urinary changes. Last bowel function was yesterday. He does have a history of small bowel obstruction in the past. We saw him in July of this year for similar. This resolved with conservative management alone. He has a significant abdominal surgical history including open cholecystectomy and appendectomy, subsequent hernia repair with mesh, and attempted LOA at some point. He was told after the LOA in 2017 that "his adhesions were tremendous" and the next time he needed surgery it would need to be open fashion. Work up in the ED revealed a mild leukocytosis to 13.9K, Hgb to 14.0, renal function normal with sCr - 0.84, hyponatremia to 128. He did undergo CT Abdomen/Pelvis which was again concerning for SBO. He did have NGT placed in ED.   Surgery is consulted by emergency medicine physician Dr. Claudell Kyle, MD in this context for evaluation and management of SBO.  PAST MEDICAL HISTORY (PMH):  Past Medical History:  Diagnosis Date   Arthritis    Bilateral renal cysts    Bladder cancer [facility])    urologist-  dr Vernie Ammons   BPH (benign prostatic hyperplasia)    Coronary artery disease    hx PCI w/ stenting in 10/ 2014 in [place]--- recently moved from [place] jan 2018 has not established a cardiologist yet but does have pcp    Dyslipidemia    Essential hypertension    GERD (gastroesophageal reflux disease)    Hematuria    History of gastric ulcer    History of hiatal hernia    History of kidney stones    History of melanoma in situ    several Excision's in situ and malignant melanoma's--  last one 06/ 2017 top of ear excision Stage 1 w/  negative margins/  other have been right upper arm, shoulder, chest, face   History of MI (myocardial infarction)    10/ 2014-- s/p  PCI and stenting x2   History of small bowel obstruction    fall 2017  s/p  abdominal lysis adhesions  (prior hx bowel obstruction total 8 times)   Nocturia    S/P right coronary artery (RCA) stent placement    10/ 2014  x2   Type 2 diabetes, diet controlled (HCC)    Wears glasses    Wears hearing aid    bilateral     PAST SURGICAL HISTORY (PSH):  Past Surgical History:  Procedure Laterality Date   ABDOMINAL HERNIA REPAIR  2012   ADDOMINAL LYSIS ADHESIONS  fall 2017   sbo   CHOLECYSTECTOMY OPEN  1971   and Appendectomy   CORONARY ANGIOPLASTY WITH STENT PLACEMENT  04-17-2013  in [place]   x2 stents to RCA   INGUINAL HERNIA REPAIR Bilateral 1995   TOTAL HIP ARTHROPLASTY Left 2008   TRANSURETHRAL RESECTION OF BLADDER TUMOR  09/2006   LEIOMYOMA    TRANSURETHRAL RESECTION OF BLADDER TUMOR WITH MITOMYCIN-C N/A 09/02/2016   Procedure: TRANSURETHRAL RESECTION OF BLADDER TUMOR WITH MITOMYCIN-C;  Surgeon: Ihor Gully, MD;  Location: [facility];  Service: Urology;  Laterality: N/A;     MEDICATIONS:  Prior to Admission medications   Medication Sig Start Date End Date Taking? Authorizing  Provider  acetaminophen (TYLENOL) 500 MG tablet Take by mouth.    [provider]  Ascorbic Acid 500 MG CAPS Take 500 mg by mouth daily. 01/19/21   [provider]  aspirin EC 81 MG tablet Take 81 mg by mouth daily.    [provider]  Cholecalciferol 50 MCG (2000 UT) TABS Take 1 tablet by mouth daily. 01/19/21   [provider]  Chromium-Cinnamon (CINNAMON PLUS CHROMIUM PO) Take 1 tablet by mouth 2 (two) times daily. 1000mg  cinnamon/  250mg  chromium    [provider]  Cinnamon 500 MG capsule Take 1,000 mg by mouth in the morning and at bedtime.    [provider]  Coenzyme Q10 100 MG capsule Take 100 mg  by mouth daily.    [provider]  cyanocobalamin 1000 MCG tablet Take 1 tablet (1,000 mcg total) by mouth daily. 01/05/23 07/04/23  Gillis Santa, MD  diphenhydrAMINE (BENADRYL) 25 mg capsule Take 25 mg by mouth every 6 (six) hours as needed.    [provider]  finasteride (PROSCAR) 5 MG tablet Take 5 mg by mouth every evening.     [provider]  furosemide (LASIX) 40 MG tablet Take 40 mg by mouth daily as needed. 09/29/21   [provider]  iron polysaccharides (NIFEREX) 150 MG capsule Take 1 capsule (150 mg total) by mouth daily. 12/30/22 06/28/23  Gillis Santa, MD  melatonin (MELATONIN MAXIMUM STRENGTH) 5 MG TABS Take 5 mg by mouth at bedtime as needed.    [provider]  metoprolol succinate (TOPROL-XL) 50 MG 24 hr tablet Take 1 tablet (50 mg total) by mouth daily. 12/31/22 06/29/23  Gillis Santa, MD  Multiple Vitamin (MULTIVITAMIN WITH MINERALS) TABS tablet Take 1 tablet by mouth daily.    [provider]  Multiple Vitamins-Minerals (PRESERVISION AREDS 2 PO) Take 1 tablet by mouth 2 (two) times daily.    [provider]  Omega-3 Fatty Acids (SUPER OMEGA 3 EPA/DHA) 1000 MG CAPS Take 1 capsule by mouth daily. Omega 3 - DHA - EPA - Fish oil-- 1000mg / 120mg / 180mg     [provider]  pantoprazole (PROTONIX) 40 MG tablet Take 1 tablet (40 mg total) by mouth daily. 12/31/22 06/29/23  Gillis Santa, MD  polyethylene glycol powder (GLYCOLAX/MIRALAX) 17 GM/SCOOP powder Take by mouth daily as needed.    [provider]  potassium chloride SA (KLOR-CON M) 20 MEQ tablet Take 20 mEq by mouth daily.    [provider]  rivastigmine (EXELON) 1.5 MG capsule Take 1.5 mg by mouth 2 (two) times daily. 01/19/21   [provider]  spironolactone (ALDACTONE) 25 MG tablet Take 25 mg by mouth daily. 12/16/20   [provider]  tamsulosin (FLOMAX) 0.4 MG CAPS capsule Take 0.4 mg by mouth every evening.     [provider]  traZODone (DESYREL) 50 MG tablet Take 50 mg by mouth at bedtime. 02/19/21   [provider]     ALLERGIES:  Allergies  Allergen Reactions   Fenofibrate     Other reaction(s): Muscle Pain   Losartan Other (See Comments)    Caused weakness, chest pressure, and shortness of breath     SOCIAL HISTORY:  Social History   Socioeconomic History   Marital status: Married    Spouse name: Not on file   Number of children: Not on file   Years of education: Not on file   Highest education level: Not on file  Occupational History  Occupation: retired physician  Tobacco Use   Smoking status: Never   Smokeless tobacco: Never  Vaping Use   Vaping status: Never Used  Substance and Sexual Activity   Alcohol use: Yes    Alcohol/week: 1.0 standard drink of alcohol    Types: 1 Glasses of wine per week    Comment: occasional   Drug use: No   Sexual activity: Not Currently  Other Topics Concern   Not on file  Social History Narrative   Not on file   Social Drivers of Health   Financial Resource Strain: Low Risk  (04/07/2023)   Received from [facility]   Overall Financial Resource Strain (CARDIA)    Difficulty of Paying Living Expenses: Not hard at all  Food Insecurity: No Food Insecurity (04/07/2023)   Received from [facility]   Hunger Vital Sign    Worried About Running Out of Food in the Last Year: Never true    Ran Out of Food in the Last Year: Never true  Transportation Needs: No Transportation Needs (04/07/2023)   Received from [facility] - Transportation    In the past 12 months, has lack of transportation kept you from medical appointments or from getting medications?: No    Lack of Transportation (Non-Medical): No  Physical Activity: Not on file  Stress: Not on file  Social Connections: Not on file  Intimate Partner Violence: Not At Risk (12/26/2022)   Humiliation, Afraid, Rape, and  Kick questionnaire    Fear of Current or Ex-Partner: No    Emotionally Abused: No    Physically Abused: No    Sexually Abused: No     FAMILY HISTORY:  Family History  Problem Relation Age of Onset   Cancer Mother    CAD Father    CAD Sister    CAD Sister    CAD Sister       REVIEW OF SYSTEMS:  Review of Systems  Constitutional:  Negative for chills and fever.  HENT:  Negative for congestion and sore throat.   Respiratory:  Negative for cough and shortness of breath.   Cardiovascular:  Negative for chest pain and palpitations.  Gastrointestinal:  Positive for abdominal pain and nausea. Negative for constipation, diarrhea and vomiting.  Genitourinary:  Negative for dysuria and urgency.  All other systems reviewed and are negative.   VITAL SIGNS:  Temp:  [97.4 F (36.3 C)-98.3 F (36.8 C)] 98.3 F (36.8 C) (12/16 0516) Pulse Rate:  [83-87] 83 (12/16 0516) Resp:  [18-22] 18 (12/16 0516) BP: (95-153)/(57-84) 153/84 (12/16 0516) SpO2:  [94 %-96 %] 95 % (12/16 0516) Weight:  [95.3 kg] 95.3 kg (12/16 0200)     Height: 5\' 11"  (180.3 cm) Weight: 95.3 kg BMI (Calculated): 29.3   INTAKE/OUTPUT:  No intake/output data recorded.  PHYSICAL EXAM:  Physical Exam Vitals and nursing note reviewed. Exam conducted with a chaperone present.  Constitutional:      General: He is not in acute distress.    Appearance: He is well-developed and normal weight. He is not ill-appearing.     Comments: Sitting in chair; NAD  HENT:     Head: Normocephalic and atraumatic.     Comments: NGT in place  Eyes:     General: No scleral icterus.    Extraocular Movements: Extraocular movements intact.  Cardiovascular:     Rate and Rhythm: Normal rate.     Heart sounds: Normal heart sounds. No murmur  heard. Pulmonary:     Effort: Pulmonary effort is normal. No respiratory distress.  Abdominal:     General: Abdomen is protuberant. A surgical scar is present. There is no distension.     Palpations:  Abdomen is soft.     Tenderness: There is no abdominal tenderness. There is no guarding or rebound.     Comments: Abdomen is soft, slightly distended. No rebound/guarding. He is certainly without peritonitis. Likely with some degree of loss of domain. Previous surgical scars again appreciated   Genitourinary:    Comments: Deferred Skin:    General: Skin is warm and dry.     Coloration: Skin is not cyanotic or jaundiced.  Neurological:     General: No focal deficit present.     Mental Status: He is alert and oriented to person, place, and time.      Labs:     Latest Ref Rng & Units 06/11/2023   10:02 PM 12/30/2022    4:45 AM 12/29/2022    4:54 PM  CBC  WBC 4.0 - 10.5 K/uL 13.9  7.3    Hemoglobin 13.0 - 17.0 g/dL 96.0  9.2  45.4   Hematocrit 39.0 - 52.0 % 41.1  29.3  32.5   Platelets 150 - 400 K/uL 328  299        Latest Ref Rng & Units 06/11/2023   10:02 PM 12/30/2022    4:45 AM 12/29/2022    6:41 AM  CMP  Glucose 70 - 99 mg/dL 098  119  147   BUN 8 - 23 mg/dL 17  10  11    Creatinine 0.61 - 1.24 mg/dL 8.29  5.62  1.30   Sodium 135 - 145 mmol/L 128  133  130   Potassium 3.5 - 5.1 mmol/L 4.4  3.7  3.5   Chloride 98 - 111 mmol/L 92  100  100   CO2 22 - 32 mmol/L 29  25  23    Calcium 8.9 - 10.3 mg/dL 9.3  8.2  7.9   Total Protein 6.5 - 8.1 g/dL 7.3     Total Bilirubin <1.2 mg/dL 0.9     Alkaline Phos 38 - 126 U/L 76     AST 15 - 41 U/L 19     ALT 0 - 44 U/L 13        Imaging studies:   CT Abdomen/Pelvis (06/11/2023) personally reviewed showing dilation of small bowel, transition in RLQ appears similar to obstruction earlier this year, no free air, and radiologist report reviewed below:  IMPRESSION: 1. Mild dilation of loops of small bowel in the left hemiabdomen measuring up to 3.3 cm in diameter. There is smooth tapering to the ileum which is decompressed. No discrete transition point is identified. Findings may represent ileus though early or partial small bowel  obstruction is difficult to exclude. 2. Unchanged small loculated right pleural effusion and associated airspace opacities. Bronchial wall thickening and mucous plugging in the right lower lobe. 3. Unchanged distention of the distal esophagus with small hiatal hernia and fluid refluxed into the esophagus.     Assessment/Plan: (ICD-10's: K69.609) 89 y.o. 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   - Appreciate medicine admission - Agree with NGT decompression; LIS; monitor and record output    - No need for emergent surgical intervention. He understands if he were to fail to improve or clinically deteriorate, we would  need to consider this. Certainly carries increased risk given his previous intra-abdominal surgeries and age  - NPO + IVF support - Monitor abdominal examination; on-going bowel function - Serial KUB as needed - Pain control prn; antiemetics prn - Mobilize as tolerated   - Further management per primary service; we will follow   All of the above findings and recommendations were discussed with the patient, and all of patient's questions were answered to his expressed satisfaction.  Thank you for the opportunity to participate in this patient's care.   -- Lynden Oxford, PA-C [facility] 06/12/2023, 7:26 AM M-F: 7am - 4pm

## 2023-06-12 NOTE — ED Notes (Signed)
Dr. Anner Crete in subwait to speak with patient at this time. This RN notified by Gerilyn Pilgrim, RN that patient's daughter stating that she might take him to another facility. Dr. Anner Crete notified.

## 2023-06-12 NOTE — H&P (Signed)
History and Physical    Tyler Pena ZOX:[id number] DOB: 1921-03-14 DOA: 06/12/2023  PCP: Marisue Ivan, MD  Patient coming from: home ([facility])   Chief Complaint: abdominal distention and pain  HPI: Tyler Pena is a 89 y.o. male with medical history significant for cad, dm, htn, recurrent sbo, who presents with the above.  For a few days felt generally unwell but no specific symptoms. Then beginning yesterday morning developed abdominal distention and discomfort. The discomfort is generalized but is b/l and more upper than lower. Also developed some nausea with a lot of eructation, no vomiting. Had a lot of stooling the day prior to this, last stool was yesterday morning (12/15), small in volume. Has passed a little bit of flatus in the last 24 hours. No chest pain or fevers. No cough.  ED Course:   CT shows partial SBO, NG tube placed.  Review of Systems: As per HPI otherwise 10 point review of systems negative.    Past Medical History:  Diagnosis Date   Arthritis    Bilateral renal cysts    Bladder cancer [facility])    urologist-  dr Vernie Ammons   BPH (benign prostatic hyperplasia)    Coronary artery disease    hx PCI w/ stenting in 10/ 2014 in [place]--- recently moved from [place] jan 2018 has not established a cardiologist yet but does have pcp    Dyslipidemia    Essential hypertension    GERD (gastroesophageal reflux disease)    Hematuria    History of gastric ulcer    History of hiatal hernia    History of kidney stones    History of melanoma in situ    several Excision's in situ and malignant melanoma's--  last one 06/ 2017 top of ear excision Stage 1 w/ negative margins/  other have been right upper arm, shoulder, chest, face   History of MI (myocardial infarction)    10/ 2014-- s/p  PCI and stenting x2   History of small bowel obstruction    fall 2017  s/p  abdominal lysis adhesions  (prior hx bowel obstruction total 8 times)   Nocturia    S/P right  coronary artery (RCA) stent placement    10/ 2014  x2   Type 2 diabetes, diet controlled (HCC)    Wears glasses    Wears hearing aid    bilateral    Past Surgical History:  Procedure Laterality Date   ABDOMINAL HERNIA REPAIR  2012   ADDOMINAL LYSIS ADHESIONS  fall 2017   sbo   CHOLECYSTECTOMY OPEN  1971   and Appendectomy   CORONARY ANGIOPLASTY WITH STENT PLACEMENT  04-17-2013  in [place]   x2 stents to RCA   INGUINAL HERNIA REPAIR Bilateral 1995   TOTAL HIP ARTHROPLASTY Left 2008   TRANSURETHRAL RESECTION OF BLADDER TUMOR  09/2006   LEIOMYOMA    TRANSURETHRAL RESECTION OF BLADDER TUMOR WITH MITOMYCIN-C N/A 09/02/2016   Procedure: TRANSURETHRAL RESECTION OF BLADDER TUMOR WITH MITOMYCIN-C;  Surgeon: Ihor Gully, MD;  Location: [facility];  Service: Urology;  Laterality: N/A;     reports that he has never smoked. He has never used smokeless tobacco. He reports current alcohol use of about 1.0 standard drink of alcohol per week. He reports that he does not use drugs.  Allergies  Allergen Reactions   Fenofibrate     Other reaction(s): Muscle Pain   Losartan Other (See Comments)    Caused weakness, chest pressure, and  shortness of breath    Family History  Problem Relation Age of Onset   Cancer Mother    CAD Father    CAD Sister    CAD Sister    CAD Sister     Prior to Admission medications   Medication Sig Start Date End Date Taking? Authorizing Provider  acetaminophen (TYLENOL) 500 MG tablet Take by mouth.    [provider]  Ascorbic Acid 500 MG CAPS Take 500 mg by mouth daily. 01/19/21   [provider]  aspirin EC 81 MG tablet Take 81 mg by mouth daily.    [provider]  Cholecalciferol 50 MCG (2000 UT) TABS Take 1 tablet by mouth daily. 01/19/21   [provider]  Chromium-Cinnamon (CINNAMON PLUS CHROMIUM PO) Take 1 tablet by mouth 2 (two) times daily. 1000mg  cinnamon/  250mg  chromium    [provider]   Cinnamon 500 MG capsule Take 1,000 mg by mouth in the morning and at bedtime.    [provider]  Coenzyme Q10 100 MG capsule Take 100 mg by mouth daily.    [provider]  cyanocobalamin 1000 MCG tablet Take 1 tablet (1,000 mcg total) by mouth daily. 01/05/23 07/04/23  Gillis Santa, MD  diphenhydrAMINE (BENADRYL) 25 mg capsule Take 25 mg by mouth every 6 (six) hours as needed.    [provider]  finasteride (PROSCAR) 5 MG tablet Take 5 mg by mouth every evening.     [provider]  furosemide (LASIX) 40 MG tablet Take 40 mg by mouth daily as needed. 09/29/21   [provider]  iron polysaccharides (NIFEREX) 150 MG capsule Take 1 capsule (150 mg total) by mouth daily. 12/30/22 06/28/23  Gillis Santa, MD  melatonin (MELATONIN MAXIMUM STRENGTH) 5 MG TABS Take 5 mg by mouth at bedtime as needed.    [provider]  metoprolol succinate (TOPROL-XL) 50 MG 24 hr tablet Take 1 tablet (50 mg total) by mouth daily. 12/31/22 06/29/23  Gillis Santa, MD  Multiple Vitamin (MULTIVITAMIN WITH MINERALS) TABS tablet Take 1 tablet by mouth daily.    [provider]  Multiple Vitamins-Minerals (PRESERVISION AREDS 2 PO) Take 1 tablet by mouth 2 (two) times daily.    [provider]  Omega-3 Fatty Acids (SUPER OMEGA 3 EPA/DHA) 1000 MG CAPS Take 1 capsule by mouth daily. Omega 3 - DHA - EPA - Fish oil-- 1000mg / 120mg / 180mg     [provider]  pantoprazole (PROTONIX) 40 MG tablet Take 1 tablet (40 mg total) by mouth daily. 12/31/22 06/29/23  Gillis Santa, MD  polyethylene glycol powder (GLYCOLAX/MIRALAX) 17 GM/SCOOP powder Take by mouth daily as needed.    [provider]  potassium chloride SA (KLOR-CON M) 20 MEQ tablet Take 20 mEq by mouth daily.    [provider]  rivastigmine (EXELON) 1.5 MG capsule Take 1.5 mg by mouth 2 (two) times daily. 01/19/21   [provider]  spironolactone (ALDACTONE) 25 MG tablet Take 25 mg  by mouth daily. 12/16/20   [provider]  tamsulosin (FLOMAX) 0.4 MG CAPS capsule Take 0.4 mg by mouth every evening.     [provider]  traZODone (DESYREL) 50 MG tablet Take 50 mg by mouth at bedtime. 02/19/21   [provider]    Physical Exam: Vitals:   06/12/23 0200 06/12/23 0438 06/12/23 0516 06/12/23 0730  BP:  117/69 (!) 153/84 (!) 141/77  Pulse:  87 83 80  Resp:  20 18  18  Temp:  (!) 97.4 F (36.3 C) 98.3 F (36.8 C)   TempSrc:  Oral Oral   SpO2:  96% 95% 96%  Weight: 95.3 kg     Height: 5\' 11"  (1.803 m)       Constitutional: No acute distress Head: Atraumatic Neck: Supple Respiratory: Clear to auscultation bilaterally  Cardiovascular: Regular rate and rhythm.   Abdomen: distended, decreased BSs lower quadrants, non-tender, ventral hernia Musculoskeletal: No joint deformity upper and lower extremities.   Skin: No visible rashes, lesions, or ulcers.  Extremities: LLE edema patient says this is chronic Neurologic: Alert, moving all 4 extremities. Psychiatric: Normal insight and judgement.   Labs on Admission: I have personally reviewed following labs and imaging studies  CBC: Recent Labs  Lab 06/11/23 2202  WBC 13.9*  HGB 14.0  HCT 41.1  MCV 90.1  PLT 328   Basic Metabolic Panel: Recent Labs  Lab 06/11/23 2202  NA 128*  K 4.4  CL 92*  CO2 29  GLUCOSE 153*  BUN 17  CREATININE 0.84  CALCIUM 9.3   GFR: Estimated Creatinine Clearance: 55.1 mL/min (by C-G formula based on SCr of 0.84 mg/dL). Liver Function Tests: Recent Labs  Lab 06/11/23 2202  AST 19  ALT 13  ALKPHOS 76  BILITOT 0.9  PROT 7.3  ALBUMIN 3.8   Recent Labs  Lab 06/11/23 2202  LIPASE 26   No results for input(s): "AMMONIA" in the last 168 hours. Coagulation Profile: No results for input(s): "INR", "PROTIME" in the last 168 hours. Cardiac Enzymes: No results for input(s): "CKTOTAL", "CKMB", "CKMBINDEX", "TROPONINI" in the last 168 hours. BNP  (last 3 results) No results for input(s): "PROBNP" in the last 8760 hours. HbA1C: No results for input(s): "HGBA1C" in the last 72 hours. CBG: No results for input(s): "GLUCAP" in the last 168 hours. Lipid Profile: No results for input(s): "CHOL", "HDL", "LDLCALC", "TRIG", "CHOLHDL", "LDLDIRECT" in the last 72 hours. Thyroid Function Tests: No results for input(s): "TSH", "T4TOTAL", "FREET4", "T3FREE", "THYROIDAB" in the last 72 hours. Anemia Panel: No results for input(s): "VITAMINB12", "FOLATE", "FERRITIN", "TIBC", "IRON", "RETICCTPCT" in the last 72 hours. Urine analysis:    Component Value Date/Time   COLORURINE YELLOW (A) 06/12/2023 0038   APPEARANCEUR CLEAR (A) 06/12/2023 0038   LABSPEC >1.046 (H) 06/12/2023 0038   PHURINE 5.0 06/12/2023 0038   GLUCOSEU NEGATIVE 06/12/2023 0038   HGBUR NEGATIVE 06/12/2023 0038   BILIRUBINUR NEGATIVE 06/12/2023 0038   KETONESUR NEGATIVE 06/12/2023 0038   PROTEINUR NEGATIVE 06/12/2023 0038   NITRITE NEGATIVE 06/12/2023 0038   LEUKOCYTESUR NEGATIVE 06/12/2023 0038    Radiological Exams on Admission: DG Abdomen 1 View Result Date: 06/12/2023 CLINICAL DATA:  NG tube placement EXAM: ABDOMEN - 1 VIEW COMPARISON:  06/11/2023 FINDINGS: Interval placement of enteric tube with tip and side port below the level of the GE junction. Mild gaseous distension of the stomach. Persistent mild small bowel dilatation within the left hemiabdomen. Fasteners for ventral abdominal wall hernia mesh are again noted. Multilevel thoracolumbar spondylosis. IMPRESSION: 1. Interval placement of enteric tube with tip and side port below the level of the GE junction. 2. Persistent mild small bowel dilatation within the left hemiabdomen. Electronically Signed   By: Signa Kell M.D.   On: 06/12/2023 06:34   CT ABDOMEN PELVIS W CONTRAST Result Date: 06/11/2023 CLINICAL DATA:  Abdominal pain and distention EXAM: CT ABDOMEN AND PELVIS WITH CONTRAST TECHNIQUE: Multidetector CT  imaging of the abdomen and pelvis was performed using the standard  protocol following bolus administration of intravenous contrast. RADIATION DOSE REDUCTION: This exam was performed according to the departmental dose-optimization program which includes automated exposure control, adjustment of the mA and/or kV according to patient size and/or use of iterative reconstruction technique. CONTRAST:  OMNIPAQUE IOHEXOL 300 MG/ML  SOLN COMPARISON:  CT abdomen and pelvis 12/26/2022 FINDINGS: Lower chest: Unchanged distention of the distal esophagus with small hiatal hernia and fluid refluxed into the esophagus. Unchanged small loculated right pleural effusion and associated airspace opacities. Bronchial wall thickening and mucous plugging in the right lower lobe. Hepatobiliary: No acute abnormality. Cholecystectomy. No biliary dilation. Pancreas: Fatty atrophy.  No acute abnormality. Spleen: Unremarkable. Adrenals/Urinary Tract: Stable adrenal glands and kidneys. No urinary calculi or hydronephrosis. Unremarkable bladder. Stomach/bowel: Mild dilation of loops of small bowel in the left hemiabdomen measuring up to 3.3 cm in diameter. There is smooth tapering to the ileum which is decompressed. No discrete transition point is identified. Normal caliber colon without wall thickening. Extensive colonic diverticulosis. No diverticulitis. The appendix is not definitively visualized. Vascular/Lymphatic: Aortic atherosclerosis. No enlarged abdominal or pelvic lymph nodes. Reproductive: Enlarged prostate. Other: Mesenteric edema and trace fluid about the dilated loops of small bowel. No free intraperitoneal air. Postoperative change about the ventral abdominal wall. Musculoskeletal: Left THA.  No acute fracture. IMPRESSION: 1. Mild dilation of loops of small bowel in the left hemiabdomen measuring up to 3.3 cm in diameter. There is smooth tapering to the ileum which is decompressed. No discrete transition point is identified.  Findings may represent ileus though early or partial small bowel obstruction is difficult to exclude. 2. Unchanged small loculated right pleural effusion and associated airspace opacities. Bronchial wall thickening and mucous plugging in the right lower lobe. 3. Unchanged distention of the distal esophagus with small hiatal hernia and fluid refluxed into the esophagus. Aortic Atherosclerosis (ICD10-I70.0). Electronically Signed   By: Minerva Fester M.D.   On: 06/11/2023 23:20    EKG: Independently reviewed. 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 Admitted July of this year similar problem, resolved with NG tube. Here CT scan shows small bowel dilation no discrete transition point so ileus vs SBO. NG tube placed in the ER.  - maintain NG tube - gen surg consulted, mgmt per them  # Acute on chronic hyponatremia Baseline low 130s, here 128 in setting of SBO, so suspect hypovolemia is the primary driver - start fluids - further w/u if fails to respond to fluids - holding home diuretics (spiro and prn lasix)  # CAD  S/p stent in 2016, asymptomatic. Declines statin - resume asa when taking po  # T2DM Diet controlled, here glucose appropriate - daily fasting sugars with morning labs  # HTN Here bp appropriate - holding home metop and spiro and prn lasix while npo  # Dementia Appears very mild - resume rivastigmine when taking po  # BPH - resume flomax when taking po  # Dispo Resides at [facility]  DVT prophylaxis: lovenox Code Status: dnr, has  signed form in room Family Communication: daughter suzanne updated telephonically 12/16 Consults called: gen surg  Level of care: Med-Surg Status is: Inpatient Remains inpatient appropriate because: need for inpatient evaluation and monitoring    Silvano Bilis MD [facility] Pager [phone]  If 7PM-7AM, please contact night-coverage [email or web address] Password [facility]  06/12/2023, 8:38 AM

## 2023-06-12 NOTE — ED Notes (Signed)
Pt placed in recliner for comfort

## 2023-06-12 NOTE — ED Notes (Signed)
Pt daughter Rosalita Chessman updated on pt getting a room.

## 2023-06-12 NOTE — ED Notes (Signed)
Pt back in the bed to take a nap.

## 2023-06-13 ENCOUNTER — Other Ambulatory Visit: Payer: Self-pay

## 2023-06-13 ENCOUNTER — Inpatient Hospital Stay: Payer: Medicare Other

## 2023-06-13 DIAGNOSIS — K56609 Unspecified intestinal obstruction, unspecified as to partial versus complete obstruction: Secondary | ICD-10-CM | POA: Diagnosis not present

## 2023-06-13 LAB — CBC
HCT: 36.3 % — ABNORMAL LOW (ref 39.0–52.0)
Hemoglobin: 12.6 g/dL — ABNORMAL LOW (ref 13.0–17.0)
MCH: 30.6 pg (ref 26.0–34.0)
MCHC: 34.7 g/dL (ref 30.0–36.0)
MCV: 88.1 fL (ref 80.0–100.0)
Platelets: 287 10*3/uL (ref 150–400)
RBC: 4.12 MIL/uL — ABNORMAL LOW (ref 4.22–5.81)
RDW: 13.6 % (ref 11.5–15.5)
WBC: 8.7 10*3/uL (ref 4.0–10.5)
nRBC: 0 % (ref 0.0–0.2)

## 2023-06-13 LAB — BASIC METABOLIC PANEL
Anion gap: 9 (ref 5–15)
BUN: 16 mg/dL (ref 8–23)
CO2: 26 mmol/L (ref 22–32)
Calcium: 8.5 mg/dL — ABNORMAL LOW (ref 8.9–10.3)
Chloride: 96 mmol/L — ABNORMAL LOW (ref 98–111)
Creatinine, Ser: 0.88 mg/dL (ref 0.61–1.24)
GFR, Estimated: >60 mL/min (ref >60)
Glucose, Bld: 149 mg/dL — ABNORMAL HIGH (ref 70–99)
Potassium: 4.3 mmol/L (ref 3.5–5.1)
Sodium: 131 mmol/L — ABNORMAL LOW (ref 135–145)

## 2023-06-13 MED ORDER — DOXAZOSIN MESYLATE 1 MG PO TABS
1.0000 mg | ORAL_TABLET | Freq: Every day | ORAL | Status: DC
  Administered 2023-06-13 – 2023-06-14 (×2): 1 mg
  Filled 2023-06-13 (×2): qty 1

## 2023-06-13 MED ORDER — DEXTROSE-SODIUM CHLORIDE 5-0.45 % IV SOLN: INTRAVENOUS | Status: AC

## 2023-06-13 MED ORDER — TAMSULOSIN HCL 0.4 MG PO CAPS: 0.4000 mg | ORAL_CAPSULE | Freq: Every day | ORAL | Status: DC

## 2023-06-13 MED ORDER — RIVASTIGMINE TARTRATE 1.5 MG PO CAPS
1.5000 mg | ORAL_CAPSULE | Freq: Two times a day (BID) | ORAL | Status: DC
  Administered 2023-06-13 – 2023-06-15 (×4): 1.5 mg via NASOGASTRIC
  Filled 2023-06-13 (×4): qty 1

## 2023-06-13 NOTE — Plan of Care (Addendum)
Patient alert and oriented x 2,patient is forgetful,thinks he is at the assisted living facility.Patient easily redirected,bed alarm remained in place.On call provider notified ,patient is a high falls risk,orders made for 1: 1 Recruitment consultant. Problem: Pain Management: Goal: General experience of comfort will improve Outcome: Progressing   Problem: Safety: Goal: Ability to remain free from injury will improve Outcome: Progressing

## 2023-06-13 NOTE — Progress Notes (Signed)
[facility] SURGICAL PROGRESS NOTE (cpt [id number])  Hospital Day(s): 1.   Interval History: Patient seen and examined, no acute events or new complaints overnight. Patient reports he is feeling well. Abdominal distension is improved. No fever, chills, nausea, emesis, or abdominal pain. Previously noted leukocytosis resolved; 8.7K. Hgb to 12.6; stable - changes likely dilutional. Renal function normal; sCr - 0.88; UO - 400 ccs + unmeasured. Hyponatremia improving to 131. NGT with 800 ccs. KUB with improvement of small bowel loops, air in colon. He has passed some gas.   Review of Systems:  Constitutional: denies fever, chills  HEENT: denies cough or congestion  Respiratory: denies any shortness of breath  Cardiovascular: denies chest pain or palpitations  Gastrointestinal: + distension; denied abdominal pain, nausea, emesis  Genitourinary: denies burning with urination or urinary frequency Musculoskeletal: denies pain, decreased motor or sensation  Vital signs in last 24 hours: [min-max] current  Temp:  [97.7 F (36.5 C)-98.4 F (36.9 C)] 97.7 F (36.5 C) (12/17 0344) Pulse Rate:  [70-80] 79 (12/17 0344) Resp:  [16-20] 20 (12/17 0344) BP: (116-153)/(66-77) 126/76 (12/17 0344) SpO2:  [94 %-98 %] 94 % (12/17 0344) Weight:  [96.5 kg] 96.5 kg (12/16 1917)     Height: 5\' 11"  (180.3 cm) Weight: 96.5 kg BMI (Calculated): 29.68   Intake/Output last 2 shifts:  12/16 0701 - 12/17 0700 In: 1337.6 [I.V.:1277.6; NG/GT:60] Out: 2100 [Urine:400; Emesis/NG output:800]   Physical Exam:  Constitutional: alert, cooperative and no distress  HENT: normocephalic without obvious abnormality; NGT in place; output still dark and thick  Eyes: PERRL, EOM's grossly intact and symmetric  Respiratory: breathing non-labored at rest  Cardiovascular: regular rate and sinus rhythm  Gastrointestinal: soft, non-tender, he remains distended. No rebound/guarding. He does have likely loss of domain with  palpable mesh Musculoskeletal: no edema or wounds, motor and sensation grossly intact, NT    Labs:     Latest Ref Rng & Units 06/13/2023    5:29 AM 06/11/2023   10:02 PM 12/30/2022    4:45 AM  CBC  WBC 4.0 - 10.5 K/uL 8.7  13.9  7.3   Hemoglobin 13.0 - 17.0 g/dL 33.2  95.1  9.2   Hematocrit 39.0 - 52.0 % 36.3  41.1  29.3   Platelets 150 - 400 K/uL 287  328  299       Latest Ref Rng & Units 06/13/2023    5:29 AM 06/11/2023   10:02 PM 12/30/2022    4:45 AM  CMP  Glucose 70 - 99 mg/dL 884  166  063   BUN 8 - 23 mg/dL 16  17  10    Creatinine 0.61 - 1.24 mg/dL 0.16  0.10  9.32   Sodium 135 - 145 mmol/L 131  128  133   Potassium 3.5 - 5.1 mmol/L 4.3  4.4  3.7   Chloride 98 - 111 mmol/L 96  92  100   CO2 22 - 32 mmol/L 26  29  25    Calcium 8.9 - 10.3 mg/dL 8.5  9.3  8.2   Total Protein 6.5 - 8.1 g/dL  7.3    Total Bilirubin <1.2 mg/dL  0.9    Alkaline Phos 38 - 126 U/L  76    AST 15 - 41 U/L  19    ALT 0 - 44 U/L  13       Imaging studies:   KUB (06/13/2023) personally reviewed with improving small bowel gas pattern and air in colon, no  free air, and radiologist report pending...   Assessment/Plan: (ICD-10's: K15.609) 89 y.o. 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   - Although he has had some bowel function, NGT output is still high and appearance consistent with very stagnant fluid. I do think he will benefit from another 24 hours of decompression. Potentially can proceed with clamping trial tomorrow (12/18) if continues to improve and NGT output slows.               - No need for emergent surgical intervention. He understands if he were to fail to improve or clinically deteriorate, we would need to consider this. Certainly carries increased risk given his previous intra-abdominal surgeries and age             - NPO +  IVF support - Monitor abdominal examination; on-going bowel function - Serial KUB as needed - will order for AM - Pain control prn; antiemetics prn - Mobilize as tolerated              - Further management per primary service; we will follow   All of the above findings and recommendations were discussed with the patient, and the medical team, and all of patient's questions were answered to his expressed satisfaction.   -- Lynden Oxford, PA-C [facility] 06/13/2023, 7:27 AM M-F: 7am - 4pm

## 2023-06-13 NOTE — Progress Notes (Addendum)
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 Admitted July of this year similar problem, resolved with NG tube. Here CT scan shows small bowel dilation no discrete transition point so ileus vs SBO. NG tube placed in the ER. No stool yet, KUB this morning shows ongoing obstruction - maintain NG tube - gen surg managing  # Acute on chronic hyponatremia Baseline low 130s, here 128 in setting of SBO, so suspect hypovolemia is the primary driver. Improved to 131 this morning with fluids - continue fluids - holding home diuretics (spiro and prn lasix)  # CAD  S/p stent in 2016, asymptomatic. Declines statin - resume asa when taking po  # T2DM Diet controlled, here glucose appropriate - daily fasting sugars with morning labs  # HTN Here bp appropriate - holding home metop and spiro and prn lasix while npo  # Dementia Appears very mild - resume rivastigmine when taking po  # BPH - resume flomax when taking po  # Dispo Resides at [facility] - PT consult, family is interested in rehab  DVT prophylaxis: lovenox Code Status: dnr, has signed form in room Family Communication: daughter suzanne updated @ bedside 12/17 Consults called: gen surg  Level of care: Med-Surg Status is: Inpatient Remains inpatient appropriate because: need for inpatient evaluation and monitoring  Consultants:  Gen  surg  Procedures: Ng tube placement  Antimicrobials:  none    Subjective: No abd pain, passing flatus but no bm  Objective: Vitals:   06/12/23 1542 06/12/23 1830 06/12/23 1917 06/13/23 0344  BP: 127/66 (!) 147/74 (!) 153/69 126/76  Pulse: 78 70 72 79  Resp: 18 18 16 20   Temp: 97.7 F (36.5 C)  98 F (36.7 C) 97.7 F (36.5 C)  TempSrc: Oral  Oral Oral  SpO2: 96% 96% 98% 94%  Weight:   96.5 kg   Height:   5\' 11"  (1.803 m)     Intake/Output Summary (Last 24 hours) at 06/13/2023 1128 Last data filed at 06/13/2023 0930 Gross per 24 hour  Intake 1367.62 ml  Output 2100 ml  Net -732.38 ml   Filed Weights   06/12/23 0200 06/12/23 1917  Weight: 95.3 kg 96.5 kg    Examination:  Constitutional: No acute distress Head: Atraumatic Neck: Supple Respiratory: Clear to auscultation bilaterally  Cardiovascular: Regular rate and rhythm.   Abdomen: distended, decreased BSs lower quadrants, non-tender, ventral hernia Musculoskeletal: No joint deformity upper and lower extremities.   Skin: No visible rashes, lesions, or ulcers.  Extremities: LLE edema patient says this is chronic Neurologic: Alert, moving all 4 extremities. Psychiatric: Normal insight and judgement.    Data Reviewed: I have personally reviewed following labs and imaging studies  CBC: Recent Labs  Lab 06/11/23 2202 06/13/23 0529  WBC 13.9* 8.7  HGB 14.0 12.6*  HCT 41.1 36.3*  MCV 90.1 88.1  PLT 328 287   Basic Metabolic Panel: Recent Labs  Lab 06/11/23 2202 06/13/23  0529  NA 128* 131*  K 4.4 4.3  CL 92* 96*  CO2 29 26  GLUCOSE 153* 149*  BUN 17 16  CREATININE 0.84 0.88  CALCIUM 9.3 8.5*   GFR: Estimated Creatinine Clearance: 52.9 mL/min (by C-G formula based on SCr of 0.88 mg/dL). Liver Function Tests: Recent Labs  Lab 06/11/23 2202  AST 19  ALT 13  ALKPHOS 76  BILITOT 0.9  PROT 7.3  ALBUMIN 3.8   Recent Labs  Lab 06/11/23 2202  LIPASE 26   No results for input(s):  "AMMONIA" in the last 168 hours. Coagulation Profile: No results for input(s): "INR", "PROTIME" in the last 168 hours. Cardiac Enzymes: No results for input(s): "CKTOTAL", "CKMB", "CKMBINDEX", "TROPONINI" in the last 168 hours. BNP (last 3 results) No results for input(s): "PROBNP" in the last 8760 hours. HbA1C: No results for input(s): "HGBA1C" in the last 72 hours. CBG: Recent Labs  Lab 06/12/23 1915  GLUCAP 163*   Lipid Profile: No results for input(s): "CHOL", "HDL", "LDLCALC", "TRIG", "CHOLHDL", "LDLDIRECT" in the last 72 hours. Thyroid Function Tests: No results for input(s): "TSH", "T4TOTAL", "FREET4", "T3FREE", "THYROIDAB" in the last 72 hours. Anemia Panel: No results for input(s): "VITAMINB12", "FOLATE", "FERRITIN", "TIBC", "IRON", "RETICCTPCT" in the last 72 hours. Urine analysis:    Component Value Date/Time   COLORURINE YELLOW (A) 06/12/2023 0038   APPEARANCEUR CLEAR (A) 06/12/2023 0038   LABSPEC >1.046 (H) 06/12/2023 0038   PHURINE 5.0 06/12/2023 0038   GLUCOSEU NEGATIVE 06/12/2023 0038   HGBUR NEGATIVE 06/12/2023 0038   BILIRUBINUR NEGATIVE 06/12/2023 0038   KETONESUR NEGATIVE 06/12/2023 0038   PROTEINUR NEGATIVE 06/12/2023 0038   NITRITE NEGATIVE 06/12/2023 0038   LEUKOCYTESUR NEGATIVE 06/12/2023 0038   Sepsis Labs: @LABRCNTIP (procalcitonin:4,lacticidven:4)  )No results found for this or any previous visit (from the past 240 hours).       Radiology Studies: DG Abd 2 Views Result Date: 06/13/2023 CLINICAL DATA:  Small-bowel obstruction EXAM: ABDOMEN - 2 VIEW COMPARISON:  06/12/2023 FINDINGS: Supine and upright frontal views of the abdomen and pelvis are obtained. Enteric catheter tip and side port project over the distal thoracic esophagus likely within the known small hiatal hernia, recommend advancing at least 4 cm to ensure side port placement within the subdiaphragmatic stomach. Distended gas-filled loops of small bowel are seen primarily within the  left mid abdomen, measuring up to 4 cm in diameter. Minimal gas and stool again seen throughout the colon. No free gas in the greater peritoneal sac. Trace right pleural effusion again noted. Otherwise the lung bases are clear. IMPRESSION: 1. Stable gaseous distension of the proximal small bowel consistent with small-bowel obstruction. 2. Enteric catheter tip projecting over the distal thoracic esophagus in the region of the known small hiatal hernia. Recommend advancing 4 cm to ensure side port placement within the gastric lumen. 3. Small right pleural effusion. Electronically Signed   By: Sharlet Salina M.D.   On: 06/13/2023 09:45   DG Abdomen 1 View Result Date: 06/12/2023 CLINICAL DATA:  Confirm NG tube placement EXAM: ABDOMEN - 1 VIEW COMPARISON:  Radiograph 06/12/2023 at 6:23 a.m. FINDINGS: Underpenetrated radiograph. NG tube tip projects over the expected location of the stomach. The side-port is not confidently visualized. IMPRESSION: NG tube tip projects over the expected location of the stomach. Side port is not definitively visualized. Electronically Signed   By: Minerva Fester M.D.   On: 06/12/2023 20:04   DG Abdomen 1 View Result Date: 06/12/2023 CLINICAL DATA:  NG tube placement  EXAM: ABDOMEN - 1 VIEW COMPARISON:  06/11/2023 FINDINGS: Interval placement of enteric tube with tip and side port below the level of the GE junction. Mild gaseous distension of the stomach. Persistent mild small bowel dilatation within the left hemiabdomen. Fasteners for ventral abdominal wall hernia mesh are again noted. Multilevel thoracolumbar spondylosis. IMPRESSION: 1. Interval placement of enteric tube with tip and side port below the level of the GE junction. 2. Persistent mild small bowel dilatation within the left hemiabdomen. Electronically Signed   By: Signa Kell M.D.   On: 06/12/2023 06:34   CT ABDOMEN PELVIS W CONTRAST Result Date: 06/11/2023 CLINICAL DATA:  Abdominal pain and distention EXAM: CT  ABDOMEN AND PELVIS WITH CONTRAST TECHNIQUE: Multidetector CT imaging of the abdomen and pelvis was performed using the standard protocol following bolus administration of intravenous contrast. RADIATION DOSE REDUCTION: This exam was performed according to the departmental dose-optimization program which includes automated exposure control, adjustment of the mA and/or kV according to patient size and/or use of iterative reconstruction technique. CONTRAST:  OMNIPAQUE IOHEXOL 300 MG/ML  SOLN COMPARISON:  CT abdomen and pelvis 12/26/2022 FINDINGS: Lower chest: Unchanged distention of the distal esophagus with small hiatal hernia and fluid refluxed into the esophagus. Unchanged small loculated right pleural effusion and associated airspace opacities. Bronchial wall thickening and mucous plugging in the right lower lobe. Hepatobiliary: No acute abnormality. Cholecystectomy. No biliary dilation. Pancreas: Fatty atrophy.  No acute abnormality. Spleen: Unremarkable. Adrenals/Urinary Tract: Stable adrenal glands and kidneys. No urinary calculi or hydronephrosis. Unremarkable bladder. Stomach/bowel: Mild dilation of loops of small bowel in the left hemiabdomen measuring up to 3.3 cm in diameter. There is smooth tapering to the ileum which is decompressed. No discrete transition point is identified. Normal caliber colon without wall thickening. Extensive colonic diverticulosis. No diverticulitis. The appendix is not definitively visualized. Vascular/Lymphatic: Aortic atherosclerosis. No enlarged abdominal or pelvic lymph nodes. Reproductive: Enlarged prostate. Other: Mesenteric edema and trace fluid about the dilated loops of small bowel. No free intraperitoneal air. Postoperative change about the ventral abdominal wall. Musculoskeletal: Left THA.  No acute fracture. IMPRESSION: 1. Mild dilation of loops of small bowel in the left hemiabdomen measuring up to 3.3 cm in diameter. There is smooth tapering to the ileum which  is decompressed. No discrete transition point is identified. Findings may represent ileus though early or partial small bowel obstruction is difficult to exclude. 2. Unchanged small loculated right pleural effusion and associated airspace opacities. Bronchial wall thickening and mucous plugging in the right lower lobe. 3. Unchanged distention of the distal esophagus with small hiatal hernia and fluid refluxed into the esophagus. Aortic Atherosclerosis (ICD10-I70.0). Electronically Signed   By: Minerva Fester M.D.   On: 06/11/2023 23:20        Scheduled Meds:  enoxaparin (LOVENOX) injection  40 mg Subcutaneous Q24H   Continuous Infusions:   LOS: 1 day     Silvano Bilis, MD [facility]   If 7PM-7AM, please contact night-coverage [email or web address] Password [facility] 06/13/2023, 11:28 AM

## 2023-06-13 NOTE — Progress Notes (Signed)
Patient arrived on the unit in wheelchair,patient alert and oriented x 4.No acute RR distress .NG tube to right nare same secured.Safety measures maintained.

## 2023-06-13 NOTE — Evaluation (Signed)
Physical Therapy Evaluation Patient Details Name: Tyler Pena MRN: [id number] DOB: 1920/11/06 Today's Date: 06/13/2023  History of Present Illness  Pt is a 89 y.o. 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   Clinical Impression  Pt alert, agreeable to PT with some education. Noted for his hearing aides to be out of battery, difficulty obtaining PLOF but pt reported he lives at [facility] and uses a rollator. Supine <> sit with supervision, extra time, and use of bed rails. Able to sit EOB with supervision and march, LAQ, heel/toe raises with gestural/visual cues. Sit <> Stand with RW and CGA and he ambulated ~70ft with RW and CGA. No LOB noted and no unsteadiness but pt endorsed fatigue.  Overall the patient demonstrated deficits (see "PT Problem List") that impede the patient's functional abilities, safety, and mobility and would benefit from skilled PT intervention.          If plan is discharge home, recommend the following: A little help with walking and/or transfers;A little help with bathing/dressing/bathroom;Assist for transportation;Assistance with cooking/housework   Can travel by private vehicle        Equipment Recommendations Other (comment) (TBD)  Recommendations for Other Services       Functional Status Assessment Patient has had a recent decline in their functional status and demonstrates the ability to make significant improvements in function in a reasonable and predictable amount of time.     Precautions / Restrictions Precautions Precautions: Fall Restrictions Weight Bearing Restrictions Per Provider Order: No      Mobility  Bed Mobility Overal bed mobility: Needs Assistance Bed Mobility: Supine to Sit, Sit to Supine     Supine to sit: Supervision Sit to supine: Supervision         Transfers Overall transfer level: Needs assistance Equipment used: Rolling walker (2 wheels) Transfers: Sit to/from Stand Sit to Stand: Contact guard assist                Ambulation/Gait Ambulation/Gait assistance: Contact guard assist Gait Distance (Feet): 65 Feet Assistive device: Rolling walker (2 wheels)         General Gait Details: no unsteadiness or LOB noted  Stairs            Wheelchair Mobility     Tilt Bed    Modified Rankin (Stroke Patients Only)       Balance Overall balance assessment: Needs assistance Sitting-balance support: Feet supported Sitting balance-Leahy Scale: Good     Standing balance support: Bilateral upper extremity supported Standing balance-Leahy Scale: Fair                               Pertinent Vitals/Pain Pain Assessment Pain Assessment: No/denies pain    Home Living Family/patient expects to be discharged to:: Assisted living                 Home Equipment: Rollator (4 wheels) Additional Comments: pt very deaf without working hearing aides, PLOF needs to be clarified as able. pt able to state he lives at [facility], uses a rollator    Prior Function                       Extremity/Trunk Assessment   Upper Extremity Assessment Upper Extremity Assessment: Overall [facility] for tasks assessed    Lower Extremity Assessment Lower  Extremity Assessment: Overall WFL for tasks assessed (able to lift BLE off bed, seated marching, LAQ, heel/toe raises)       Communication      Cognition Arousal: Alert Behavior During Therapy: WFL for tasks assessed/performed Overall Cognitive Status: Within Functional Limits for tasks assessed                                          General Comments      Exercises     Assessment/Plan    PT Assessment Patient needs continued PT services  PT Problem List Decreased strength;Decreased activity tolerance;Decreased balance        PT Treatment Interventions DME instruction;Patient/family education;Gait training;Stair training;Functional mobility training;Therapeutic activities;Therapeutic exercise;Balance training;Neuromuscular re-education    PT Goals (Current goals can be found in the Care Plan section)  Acute Rehab PT Goals Patient Stated Goal: to get his strength back PT Goal Formulation: With patient Time For Goal Achievement: 06/27/23 Potential to Achieve Goals: Good    Frequency Min 1X/week     Co-evaluation               AM-PAC PT "6 Clicks" Mobility  Outcome Measure Help needed turning from your back to your side while in a flat bed without using bedrails?: A Little Help needed moving from lying on your back to sitting on the side of a flat bed without using bedrails?: A Little Help needed moving to and from a bed to a chair (including a wheelchair)?: A Little Help needed standing up from a chair using your arms (e.g., wheelchair or bedside chair)?: A Little Help needed to walk in hospital room?: A Little Help needed climbing 3-5 steps with a railing? : A Little 6 Click Score: 18    End of Session Equipment Utilized During Treatment: Gait belt Activity Tolerance: Patient tolerated treatment well Patient left: in bed;with call bell/phone within reach;with nursing/sitter in room;with bed alarm set Nurse Communication: Mobility status PT Visit Diagnosis: Other abnormalities of gait and mobility (R26.89);Muscle weakness (generalized) (M62.81);Difficulty in walking, not elsewhere classified (R26.2)    Time: 1445-1500 PT Time Calculation (min) (ACUTE ONLY): 15 min   Charges:   PT Evaluation $PT Eval Low Complexity: 1 Low PT Treatments $Therapeutic Activity: 8-22 mins PT General Charges $$ ACUTE PT VISIT: 1 Visit        Olga Coaster PT, DPT 3:53 PM,06/13/23

## 2023-06-14 ENCOUNTER — Inpatient Hospital Stay: Payer: Medicare Other

## 2023-06-14 DIAGNOSIS — K56609 Unspecified intestinal obstruction, unspecified as to partial versus complete obstruction: Secondary | ICD-10-CM | POA: Diagnosis not present

## 2023-06-14 LAB — BASIC METABOLIC PANEL
Anion gap: 10 (ref 5–15)
BUN: 12 mg/dL (ref 8–23)
CO2: 26 mmol/L (ref 22–32)
Calcium: 8.3 mg/dL — ABNORMAL LOW (ref 8.9–10.3)
Chloride: 94 mmol/L — ABNORMAL LOW (ref 98–111)
Creatinine, Ser: 0.74 mg/dL (ref 0.61–1.24)
GFR, Estimated: >60 mL/min (ref >60)
Glucose, Bld: 161 mg/dL — ABNORMAL HIGH (ref 70–99)
Potassium: 3.2 mmol/L — ABNORMAL LOW (ref 3.5–5.1)
Sodium: 130 mmol/L — ABNORMAL LOW (ref 135–145)

## 2023-06-14 LAB — SARS CORONAVIRUS 2 BY RT PCR: SARS Coronavirus 2 by RT PCR: NEGATIVE

## 2023-06-14 LAB — CBC
HCT: 36.8 % — ABNORMAL LOW (ref 39.0–52.0)
Hemoglobin: 12.8 g/dL — ABNORMAL LOW (ref 13.0–17.0)
MCH: 30.7 pg (ref 26.0–34.0)
MCHC: 34.8 g/dL (ref 30.0–36.0)
MCV: 88.2 fL (ref 80.0–100.0)
Platelets: 289 10*3/uL (ref 150–400)
RBC: 4.17 MIL/uL — ABNORMAL LOW (ref 4.22–5.81)
RDW: 13.4 % (ref 11.5–15.5)
WBC: 11.5 10*3/uL — ABNORMAL HIGH (ref 4.0–10.5)
nRBC: 0 % (ref 0.0–0.2)

## 2023-06-14 MED ORDER — PANTOPRAZOLE SODIUM 40 MG IV SOLR
40.0000 mg | INTRAVENOUS | Status: DC
  Administered 2023-06-14 – 2023-06-15 (×2): 40 mg via INTRAVENOUS
  Filled 2023-06-14 (×2): qty 10

## 2023-06-14 MED ORDER — DIATRIZOATE MEGLUMINE & SODIUM 66-10 % PO SOLN
90.0000 mL | Freq: Once | ORAL | Status: AC
  Administered 2023-06-14: 90 mL via NASOGASTRIC

## 2023-06-14 MED ORDER — DEXTROSE-SODIUM CHLORIDE 5-0.45 % IV SOLN
INTRAVENOUS | Status: DC
Start: 2023-06-14 — End: 2023-06-14

## 2023-06-14 MED ORDER — KCL IN DEXTROSE-NACL 20-5-0.45 MEQ/L-%-% IV SOLN
INTRAVENOUS | Status: DC
  Filled 2023-06-14 (×2): qty 1000

## 2023-06-14 NOTE — Evaluation (Signed)
Occupational Therapy Evaluation Patient Details Name: Tyler Pena MRN: [id number] DOB: 1921-04-29 Today's Date: 06/14/2023   History of Present Illness Pt is a 89 y.o. 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   Clinical Impression   [name] was seen for OT evaluation this date. Prior to hospital admission, pt was MOD I using rollator. Pt lives at [facility]. Pt currently requires SBA + RW for ADL t/f ~80 ft and grooming tasks with seated rest break as needed. Pt would benefit from skilled OT to address noted impairments and functional limitations (see below for any additional details). Upon hospital discharge, recommend OT follow up.     If plan is discharge home, recommend the following: A little help with bathing/dressing/bathroom    Functional Status Assessment  Patient has had a recent decline in their functional status and demonstrates the ability to make significant improvements in function in a reasonable and predictable amount of time.  Equipment Recommendations  None recommended by OT    Recommendations for Other Services       Precautions / Restrictions Precautions Precautions: Fall Restrictions Weight Bearing Restrictions Per Provider Order: No      Mobility Bed Mobility               General bed mobility comments: not tested    Transfers Overall transfer level: Needs assistance Equipment used: Rolling walker (2 wheels) Transfers: Sit to/from Stand Sit to Stand: Supervision           General transfer comment: SBA      Balance Overall balance assessment: Needs assistance Sitting-balance support: Feet supported Sitting balance-Leahy Scale: Good     Standing balance support: Single extremity supported, During functional activity Standing balance-Leahy Scale: Fair                              ADL either performed or assessed with clinical judgement   ADL Overall ADL's : Needs assistance/impaired                                       General ADL Comments: SBA + RW for ADL t/f ~80 ft and grooming tasks with seated rest break as needed.      Pertinent Vitals/Pain Pain Assessment Pain Assessment: No/denies pain     Extremity/Trunk Assessment Upper Extremity Assessment Upper Extremity Assessment: Overall WFL for tasks assessed   Lower Extremity Assessment Lower Extremity Assessment: Generalized weakness       Communication Communication Communication: Hearing impairment   Cognition Arousal: Alert Behavior During Therapy: WFL for tasks assessed/performed Overall Cognitive Status: Within Functional Limits for tasks assessed                                                  Home Living Family/patient expects to be discharged to:: Private residence [facility])                                        Prior Functioning/Environment Prior Level of Function : Independent/Modified Independent  OT Problem List: Decreased strength;Decreased range of motion;Decreased activity tolerance;Impaired balance (sitting and/or standing)      OT Treatment/Interventions: Therapeutic exercise;Self-care/ADL training;Energy conservation;DME and/or AE instruction    OT Goals(Current goals can be found in the care plan section) Acute Rehab OT Goals Patient Stated Goal: to go home OT Goal Formulation: With patient Time For Goal Achievement: 06/28/23 Potential to Achieve Goals: Good ADL Goals Pt Will Perform Grooming: Independently;standing Pt Will Perform Lower Body Dressing: Independently;sit to/from stand Pt Will Transfer to Toilet: ambulating;regular height toilet;with modified independence  OT Frequency: Min 1X/week    Co-evaluation              AM-PAC OT "6  Clicks" Daily Activity     Outcome Measure Help from another person eating meals?: None Help from another person taking care of personal grooming?: A Little Help from another person toileting, which includes using toliet, bedpan, or urinal?: A Little Help from another person bathing (including washing, rinsing, drying)?: A Little Help from another person to put on and taking off regular upper body clothing?: A Little Help from another person to put on and taking off regular lower body clothing?: A Little 6 Click Score: 19   End of Session Equipment Utilized During Treatment: Rolling walker (2 wheels)  Activity Tolerance: Patient tolerated treatment well Patient left: in chair;with call bell/phone within reach;with nursing/sitter in room  OT Visit Diagnosis: Other abnormalities of gait and mobility (R26.89)                Time: 1308-6578 OT Time Calculation (min): 17 min Charges:  OT General Charges $OT Visit: 1 Visit OT Evaluation $OT Eval Moderate Complexity: 1 Mod  Kathie Dike, M.S. OTR/L  06/14/23, 12:46 PM  ascom [phone]

## 2023-06-14 NOTE — Plan of Care (Signed)

## 2023-06-14 NOTE — Progress Notes (Signed)
06/14/2023  Subjective: No acute events overnight.  Patient reports that he is passing gas but has not had a bowel movement.  NG output is still quite high and was 1400 mL yesterday with so far high output for this morning.  White blood cell come mildly elevated to 11.5 today.  KUB today still showing some distended loops of small bowel particularly proximally consistent still with small bowel obstruction.  Patient reports that he feels less distended and denies any pain.  Vital signs: Temp:  [97.7 F (36.5 C)-99.1 F (37.3 C)] 99.1 F (37.3 C) (12/18 0753) Pulse Rate:  [73-88] 86 (12/18 0753) Resp:  [16-18] 18 (12/18 0753) BP: (126-146)/(63-72) 129/63 (12/18 0753) SpO2:  [94 %-96 %] 94 % (12/18 0753)   Intake/Output: 12/17 0701 - 12/18 0700 In: 150 [NG/GT:150] Out: 1400 [Emesis/NG output:1400]    Physical Exam: Constitutional: No acute distress Abdomen: Soft, distended, nontender to palpation.  Labs:  Recent Labs    06/13/23 0529 06/14/23 0441  WBC 8.7 11.5*  HGB 12.6* 12.8*  HCT 36.3* 36.8*  PLT 287 289   Recent Labs    06/13/23 0529 06/14/23 0441  NA 131* 130*  K 4.3 3.2*  CL 96* 94*  CO2 26 26  GLUCOSE 149* 161*  BUN 16 12  CREATININE 0.88 0.74  CALCIUM 8.5* 8.3*   No results for input(s): "LABPROT", "INR" in the last 72 hours.  Imaging: DG ABD ACUTE 2+V W 1V CHEST Result Date: 06/14/2023 CLINICAL DATA:  Small-bowel obstruction EXAM: DG ABDOMEN ACUTE WITH 1 VIEW CHEST COMPARISON:  06/13/2023 FINDINGS: Supine and upright frontal views of the abdomen and pelvis as well as an upright frontal view of the chest are obtained. Cardiac silhouette is unremarkable. Small partially loculated right pleural effusion unchanged. No airspace disease or pneumothorax. Enteric catheter passes below diaphragm, tip projecting over the gastric body. Mildly distended gas-filled loops of small bowel within the central abdomen measuring up to 4 cm. Stable gas and stool throughout the  colon. No masses or abnormal calcifications. No free gas in the greater peritoneal sac. IMPRESSION: 1. Stable gaseous distention of the small bowel within the central abdomen, compatible with small-bowel obstruction. 2. Enteric catheter passing below diaphragm, tip and side port projecting over the region of the gastric body. 3. Small right pleural effusion unchanged. Electronically Signed   By: Sharlet Salina M.D.   On: 06/14/2023 10:38    Assessment/Plan: This is a 89 y.o. male with small bowel obstruction.  - Given persistent findings on his KUB today and high output from his NG tube, will order Gastrografin challenge for the patient today. - His output is also blood-tinged so we will order IV Protonix in case he is having any stress ulcers from the NG tube. - Will continue to follow with you.   I spent 35 minutes dedicated to the care of this patient on the date of this encounter to include pre-visit review of records, face-to-face time with the patient discussing diagnosis and management, and any post-visit coordination of care.  Howie Ill, MD [facility]

## 2023-06-14 NOTE — [place] FL2 (Signed)
[place] MEDICAID FL2 LEVEL OF CARE FORM     IDENTIFICATION  Patient Name: Tyler Pena Birthdate: 05/28/1921 Sex: male Admission Date (Current Location): 06/12/2023  [facility] and [place] Number:  Chiropodist and Address:         Provider Number: [id number]  Attending Physician Name and Address:  Kathrynn Running, MD  Relative Name and Phone Number:       Current Level of Care: Hospital Recommended Level of Care: Skilled Nursing Facility Prior Approval Number:    Date Approved/Denied:   PASRR Number:    Discharge Plan: SNF    Current Diagnoses: Patient Active Problem List   Diagnosis Date Noted   SBO (small bowel obstruction) (HCC) 06/12/2023   Hyponatremia 06/12/2023   BPH (benign prostatic hyperplasia) 12/26/2022   Pleural effusion 07/02/2020   1st degree AV block 09/17/2018   Diabetes mellitus type 2, diet-controlled (HCC) 11/28/2016   Small bowel obstruction (HCC) 11/07/2016   Partial small bowel obstruction (HCC) 10/28/2016   Carcinoma (HCC) 10/12/2016   Chest pain, rule out acute myocardial infarction 10/07/2016   Bladder tumor 09/02/2016   Benign prostatic hyperplasia with weak urinary stream 08/18/2016   Coronary artery disease involving native coronary artery of native heart without angina pectoris 08/18/2016   Essential hypertension 08/18/2016   GERD without esophagitis 08/18/2016    Orientation RESPIRATION BLADDER Height & Weight     Time, Self, Situation, Place  Normal Continent Weight: 96.5 kg Height:  5\' 11"  (180.3 cm)  BEHAVIORAL SYMPTOMS/MOOD NEUROLOGICAL BOWEL NUTRITION STATUS      Continent  (Currently with NG tube, and NPO.  NG will be removed and diet wto be advanced prior to discharge)  AMBULATORY STATUS COMMUNICATION OF NEEDS Skin   Total Care Verbally Normal                       Personal Care Assistance Level of Assistance              Functional Limitations Info             SPECIAL CARE FACTORS  FREQUENCY  OT (By licensed OT), PT (By licensed PT)                    Contractures Contractures Info: Not present    Additional Factors Info  Code Status, Allergies Code Status Info: DNR Allergies Info: Fenofibrate, Losartan           Current Medications (06/14/2023):  This is the current hospital active medication list Current Facility-Administered Medications  Medication Dose Route Frequency Provider Last Rate Last Admin   dextrose 5 % and 0.45 % NaCl with KCl 20 mEq/L infusion   Intravenous Continuous Wouk, Wilfred Curtis, MD       doxazosin (CARDURA) tablet 1 mg  1 mg Per Tube QHS Wouk, Wilfred Curtis, MD   1 mg at 06/13/23 2217   metoprolol tartrate (LOPRESSOR) injection 10 mg  10 mg Intravenous Q6H PRN Wouk, Wilfred Curtis, MD       pantoprazole (PROTONIX) injection 40 mg  40 mg Intravenous Q24H Piscoya, Jose, MD   40 mg at 06/14/23 1213   rivastigmine (EXELON) capsule 1.5 mg  1.5 mg Per NG tube BID Kathrynn Running, MD   1.5 mg at 06/14/23 0109     Discharge Medications: Please see discharge summary for a list of discharge medications.  Relevant Imaging Results:  Relevant Lab Results:   Additional Information SS [id number]  Chapman Fitch, RN

## 2023-06-14 NOTE — TOC Initial Note (Signed)
Transition of Care [facility]) - Initial/Assessment Note    Patient Details  Name: Tyler Pena MRN: [id number] Date of Birth: 02-11-1921  [facility] CM/SW Contact:    Chapman Fitch, RN Phone Number: 06/14/2023, 4:24 PM  Clinical Narrative:                  Per nurse patient is forgetful and disposition needs to be discussed with daughter.    Call place to daughter.  She states that patient lives at [facility]. She states that Theron Arista from ALF feels like patient need SNF before return.  Daughter states she is in agreement that patient needs SNF.   Therapy recs for home health.    Call placed to Sue Lush at [facility] confirms he can come to SNF even with home health recs. She requests for patient to be ambulated after NG removed.    Daughter and MD updated        Patient Goals and CMS Choice            Expected Discharge Plan and Services                                              Prior Living Arrangements/Services                       Activities of Daily Living   ADL Screening (condition at time of admission) Independently performs ADLs?: Yes (appropriate for developmental age) Is the patient deaf or have difficulty hearing?: Yes Does the patient have difficulty seeing, even when wearing glasses/contacts?: No Does the patient have difficulty concentrating, remembering, or making decisions?: No  Permission Sought/Granted                  Emotional Assessment              Admission diagnosis:  Small bowel obstruction (HCC) [K56.609] SBO (small bowel obstruction) (HCC) [K56.609] Nausea and vomiting, unspecified vomiting type [R11.2] Patient Active Problem List   Diagnosis Date Noted   SBO (small bowel obstruction) (HCC) 06/12/2023   Hyponatremia 06/12/2023   BPH (benign prostatic hyperplasia) 12/26/2022   Pleural effusion 07/02/2020   1st degree AV block 09/17/2018   Diabetes mellitus type 2, diet-controlled  (HCC) 11/28/2016   Small bowel obstruction (HCC) 11/07/2016   Partial small bowel obstruction (HCC) 10/28/2016   Carcinoma (HCC) 10/12/2016   Chest pain, rule out acute myocardial infarction 10/07/2016   Bladder tumor 09/02/2016   Benign prostatic hyperplasia with weak urinary stream 08/18/2016   Coronary artery disease involving native coronary artery of native heart without angina pectoris 08/18/2016   Essential hypertension 08/18/2016   GERD without esophagitis 08/18/2016   PCP:  Marisue Ivan, MD Pharmacy:   [id number] [place] [place] - [address] [place] [address] Phone: [phone] Fax: [phone]  [id number] - [place], [facility] - [address] [place] [address] Phone: [phone] Fax: [phone]     Social Drivers of Health (SDOH) Social History: SDOH Screenings   Food Insecurity: No Food Insecurity (06/12/2023)  Housing: Low Risk  (06/12/2023)  Transportation Needs: No Transportation Needs (06/12/2023)  Utilities: Not At Risk (06/12/2023)  Financial Resource Strain: Low Risk  (04/07/2023)   Received from [facility]  Tobacco Use: Low Risk  (  06/11/2023)   SDOH Interventions:     Readmission Risk Interventions     No data to display

## 2023-06-14 NOTE — Progress Notes (Addendum)
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 Admitted July of this year similar problem, resolved with NG tube. Here CT scan shows small bowel dilation no discrete transition point so ileus vs SBO. NG tube placed in the ER. Gastrograffin started, today had a BM - maintain NG tube per gen surg may be able to d/c tomorrow - gen surg managing  # Upper GI bleed? Dark fluid from NG tube appears to be blood-tinged. Hemoglobin stable in the 12s. Denies history GI bleed. Is on asa at home (held) - will review with GI - continue PPI started by GI  # Cough New today, no hypoxia or fever, cxr without acute findings - monitor, check covid  # Acute on chronic hyponatremia Baseline low 130s, here 128 in setting of SBO, so suspect hypovolemia is the primary driver. Improved to 131 this morning with fluids - continue fluids - holding home diuretics (spiro and prn lasix)  # CAD  S/p stent in 2016, asymptomatic. Declines statin - resume asa when taking po  # T2DM Diet controlled, here glucose appropriate - daily fasting sugars with morning labs  # HTN Here bp appropriate - holding home metop and spiro and prn lasix while npo  # Dementia with delirium A bit delirious overnight, resolved - cont home rivastigmine    # BPH - doxazosin substituted for home flomax so that it can be given via his NG tube  # Dispo Resides at [facility] - PT consulted, advising HH  DVT prophylaxis: scds Code Status: dnr, has signed form in room Family Communication: daughter suzanne updated telephonically 12/18 Consults called: gen surg  Level of care: Med-Surg Status is: Inpatient Remains inpatient appropriate because: need for inpatient evaluation and monitoring  Consultants:  Gen surg  Procedures: Ng tube placement  Antimicrobials:  none    Subjective: No abd pain, passing flatus, had bm  Objective: Vitals:   06/13/23 1839 06/13/23 1914 06/14/23 0500 06/14/23 0753  BP: 126/70 (!) 141/68 (!) 146/72 129/63  Pulse: 81 73 88 86  Resp: 18 18 16 18   Temp: 98.5 F (36.9 C) 97.9 F (36.6 C) 97.7 F (36.5 C) 99.1 F (37.3 C)  TempSrc: Oral   Oral  SpO2: 96% 95% 95% 94%  Weight:      Height:        Intake/Output Summary (Last 24 hours) at 06/14/2023 1411 Last data filed at 06/14/2023 1214 Gross per 24 hour  Intake 150 ml  Output 1400 ml  Net -1250 ml   Filed Weights   06/12/23 0200 06/12/23 1917  Weight: 95.3 kg 96.5 kg    Examination:  Constitutional: No acute distress Head: Atraumatic Neck: Supple Respiratory: Clear to auscultation bilaterally  Cardiovascular: Regular rate and rhythm.   Abdomen: distended, increased bowel sounds, non-tender, ventral hernia Musculoskeletal: No joint deformity upper and lower extremities.   Skin: No visible rashes, lesions, or ulcers.  Extremities: LLE edema patient says  this is chronic Neurologic: Alert, moving all 4 extremities. Psychiatric: Normal insight and judgement.    Data Reviewed: I have personally reviewed following labs and imaging studies  CBC: Recent Labs  Lab 06/11/23 2202 06/13/23 0529 06/14/23 0441  WBC 13.9* 8.7 11.5*  HGB 14.0 12.6* 12.8*  HCT 41.1 36.3* 36.8*  MCV 90.1 88.1 88.2  PLT 328 287 289   Basic Metabolic Panel: Recent Labs  Lab 06/11/23 2202 06/13/23 0529 06/14/23 0441  NA 128* 131* 130*  K 4.4 4.3 3.2*  CL  92* 96* 94*  CO2 29 26 26   GLUCOSE 153* 149* 161*  BUN 17 16 12   CREATININE 0.84 0.88 0.74  CALCIUM 9.3 8.5* 8.3*   GFR: Estimated Creatinine Clearance: 58.2 mL/min (by C-G formula based on SCr of 0.74 mg/dL). Liver Function Tests: Recent Labs  Lab 06/11/23 2202  AST 19  ALT 13  ALKPHOS 76  BILITOT 0.9  PROT 7.3  ALBUMIN 3.8   Recent Labs  Lab 06/11/23 2202  LIPASE 26   No results for input(s): "AMMONIA" in the last 168 hours. Coagulation Profile: No results for input(s): "INR", "PROTIME" in the last 168 hours. Cardiac Enzymes: No results for input(s): "CKTOTAL", "CKMB", "CKMBINDEX", "TROPONINI" in the last 168 hours. BNP (last 3 results) No results for input(s): "PROBNP" in the last 8760 hours. HbA1C: No results for input(s): "HGBA1C" in the last 72 hours. CBG: Recent Labs  Lab 06/12/23 1915  GLUCAP 163*   Lipid Profile: No results for input(s): "CHOL", "HDL", "LDLCALC", "TRIG", "CHOLHDL", "LDLDIRECT" in the last 72 hours. Thyroid Function Tests: No results for input(s): "TSH", "T4TOTAL", "FREET4", "T3FREE", "THYROIDAB" in the last 72 hours. Anemia Panel: No results for input(s): "VITAMINB12", "FOLATE", "FERRITIN", "TIBC", "IRON", "RETICCTPCT" in the last 72 hours. Urine analysis:    Component Value Date/Time   COLORURINE YELLOW (A) 06/12/2023 0038   APPEARANCEUR CLEAR (A) 06/12/2023 0038   LABSPEC >1.046 (H) 06/12/2023 0038   PHURINE 5.0 06/12/2023 0038   GLUCOSEU NEGATIVE 06/12/2023 0038   HGBUR NEGATIVE 06/12/2023 0038   BILIRUBINUR NEGATIVE 06/12/2023 0038   KETONESUR NEGATIVE 06/12/2023 0038   PROTEINUR NEGATIVE 06/12/2023 0038   NITRITE NEGATIVE 06/12/2023 0038   LEUKOCYTESUR NEGATIVE 06/12/2023 0038   Sepsis Labs: @LABRCNTIP (procalcitonin:4,lacticidven:4)  )No results found for this or any previous visit (from the past 240 hours).       Radiology Studies: DG ABD ACUTE 2+V W 1V CHEST Result Date: 06/14/2023 CLINICAL DATA:  Small-bowel  obstruction EXAM: DG ABDOMEN ACUTE WITH 1 VIEW CHEST COMPARISON:  06/13/2023 FINDINGS: Supine and upright frontal views of the abdomen and pelvis as well as an upright frontal view of the chest are obtained. Cardiac silhouette is unremarkable. Small partially loculated right pleural effusion unchanged. No airspace disease or pneumothorax. Enteric catheter passes below diaphragm, tip projecting over the gastric body. Mildly distended gas-filled loops of small bowel within the central abdomen measuring up to 4 cm. Stable gas and stool throughout the colon. No masses or abnormal calcifications. No free gas in the greater peritoneal sac. IMPRESSION: 1. Stable gaseous distention of the small bowel within the central abdomen, compatible with small-bowel obstruction. 2. Enteric catheter passing below diaphragm, tip and side port projecting over the region of the gastric body. 3. Small right pleural effusion unchanged. Electronically Signed   By: Sharlet Salina M.D.   On: 06/14/2023 10:38   DG Abd 2 Views Result Date: 06/13/2023 CLINICAL DATA:  Small-bowel obstruction EXAM: ABDOMEN - 2 VIEW COMPARISON:  06/12/2023 FINDINGS:  Supine and upright frontal views of the abdomen and pelvis are obtained. Enteric catheter tip and side port project over the distal thoracic esophagus likely within the known small hiatal hernia, recommend advancing at least 4 cm to ensure side port placement within the subdiaphragmatic stomach. Distended gas-filled loops of small bowel are seen primarily within the left mid abdomen, measuring up to 4 cm in diameter. Minimal gas and stool again seen throughout the colon. No free gas in the greater peritoneal sac. Trace right pleural effusion again noted. Otherwise the lung bases are clear. IMPRESSION: 1. Stable gaseous distension of the proximal small bowel consistent with small-bowel obstruction. 2. Enteric catheter tip projecting over the distal thoracic esophagus in the region of the known small  hiatal hernia. Recommend advancing 4 cm to ensure side port placement within the gastric lumen. 3. Small right pleural effusion. Electronically Signed   By: Sharlet Salina M.D.   On: 06/13/2023 09:45   DG Abdomen 1 View Result Date: 06/12/2023 CLINICAL DATA:  Confirm NG tube placement EXAM: ABDOMEN - 1 VIEW COMPARISON:  Radiograph 06/12/2023 at 6:23 a.m. FINDINGS: Underpenetrated radiograph. NG tube tip projects over the expected location of the stomach. The side-port is not confidently visualized. IMPRESSION: NG tube tip projects over the expected location of the stomach. Side port is not definitively visualized. Electronically Signed   By: Minerva Fester M.D.   On: 06/12/2023 20:04        Scheduled Meds:  doxazosin  1 mg Per Tube QHS   enoxaparin (LOVENOX) injection  40 mg Subcutaneous Q24H   pantoprazole (PROTONIX) IV  40 mg Intravenous Q24H   rivastigmine  1.5 mg Per NG tube BID   Continuous Infusions:  dextrose 5 % and 0.45 % NaCl with KCl 20 mEq/L       LOS: 2 days     Silvano Bilis, MD [facility]   If 7PM-7AM, please contact night-coverage [email or web address] Password [email or web address] 06/14/2023, 2:11 PM

## 2023-06-15 DIAGNOSIS — K56609 Unspecified intestinal obstruction, unspecified as to partial versus complete obstruction: Secondary | ICD-10-CM | POA: Diagnosis not present

## 2023-06-15 LAB — BASIC METABOLIC PANEL
Anion gap: 12 (ref 5–15)
BUN: 13 mg/dL (ref 8–23)
CO2: 24 mmol/L (ref 22–32)
Calcium: 8.5 mg/dL — ABNORMAL LOW (ref 8.9–10.3)
Chloride: 96 mmol/L — ABNORMAL LOW (ref 98–111)
Creatinine, Ser: 0.98 mg/dL (ref 0.61–1.24)
GFR, Estimated: >60 mL/min (ref >60)
Glucose, Bld: 163 mg/dL — ABNORMAL HIGH (ref 70–99)
Potassium: 3.1 mmol/L — ABNORMAL LOW (ref 3.5–5.1)
Sodium: 132 mmol/L — ABNORMAL LOW (ref 135–145)

## 2023-06-15 LAB — CBC
HCT: 40 % (ref 39.0–52.0)
Hemoglobin: 13.6 g/dL (ref 13.0–17.0)
MCH: 30.4 pg (ref 26.0–34.0)
MCHC: 34 g/dL (ref 30.0–36.0)
MCV: 89.3 fL (ref 80.0–100.0)
Platelets: 263 10*3/uL (ref 150–400)
RBC: 4.48 MIL/uL (ref 4.22–5.81)
RDW: 13.4 % (ref 11.5–15.5)
WBC: 11.8 10*3/uL — ABNORMAL HIGH (ref 4.0–10.5)
nRBC: 0 % (ref 0.0–0.2)

## 2023-06-15 LAB — MAGNESIUM: Magnesium: 1.8 mg/dL (ref 1.7–2.4)

## 2023-06-15 MED ORDER — RIVASTIGMINE TARTRATE 1.5 MG PO CAPS
1.5000 mg | ORAL_CAPSULE | Freq: Two times a day (BID) | ORAL | Status: DC
Start: 2023-06-15 — End: 2023-06-16
  Administered 2023-06-16: 1.5 mg via ORAL
  Filled 2023-06-15 (×2): qty 1

## 2023-06-15 MED ORDER — SPIRONOLACTONE 25 MG PO TABS
25.0000 mg | ORAL_TABLET | Freq: Every day | ORAL | Status: DC
  Administered 2023-06-15 – 2023-06-16 (×2): 25 mg via ORAL
  Filled 2023-06-15 (×2): qty 1

## 2023-06-15 MED ORDER — FINASTERIDE 5 MG PO TABS
5.0000 mg | ORAL_TABLET | Freq: Every evening | ORAL | Status: DC
  Administered 2023-06-15: 5 mg via ORAL
  Filled 2023-06-15: qty 1

## 2023-06-15 MED ORDER — TAMSULOSIN HCL 0.4 MG PO CAPS
0.4000 mg | ORAL_CAPSULE | Freq: Every evening | ORAL | Status: DC
  Administered 2023-06-15: 0.4 mg via ORAL
  Filled 2023-06-15: qty 1

## 2023-06-15 MED ORDER — TRAZODONE HCL 50 MG PO TABS
50.0000 mg | ORAL_TABLET | Freq: Every day | ORAL | Status: DC
Start: 2023-06-15 — End: 2023-06-16
  Administered 2023-06-15: 50 mg via ORAL
  Filled 2023-06-15: qty 1

## 2023-06-15 MED ORDER — MAGNESIUM SULFATE 2 GM/50ML IV SOLN
2.0000 g | Freq: Once | INTRAVENOUS | Status: AC
  Administered 2023-06-15: 2 g via INTRAVENOUS
  Filled 2023-06-15: qty 50

## 2023-06-15 MED ORDER — POTASSIUM CHLORIDE 20 MEQ PO PACK
60.0000 meq | PACK | Freq: Once | ORAL | Status: AC
  Administered 2023-06-15: 60 meq via ORAL
  Filled 2023-06-15: qty 3

## 2023-06-15 MED ORDER — METOPROLOL SUCCINATE ER 50 MG PO TB24
50.0000 mg | ORAL_TABLET | Freq: Every day | ORAL | Status: DC
  Administered 2023-06-15 – 2023-06-16 (×2): 50 mg via ORAL
  Filled 2023-06-15 (×2): qty 1

## 2023-06-15 NOTE — Plan of Care (Signed)

## 2023-06-15 NOTE — Progress Notes (Signed)
[facility] SURGICAL PROGRESS NOTE (cpt [phone])  Hospital Day(s): 3.   Interval History: Patient seen and examined, no acute events or new complaints overnight. Patient reports he is doing well; anxious to get NGT out. He is quite congested. No fever, chills, nausea, emesis, or abdominal pain. Very mild leukocytosis to 11.8K. Hgb to 13.6. Renal function normal; sCr - 0.98; UO - unmeasured. Hyponatremia to 131. Hypokalemia to 3.1. NGT with 1400 ccs. KUB with contrast shows contrast reaching the entire colon and into the rectum. He has been able to pass stool in last 24 hours; loose.  Review of Systems:  Constitutional: denies fever, chills  HEENT: denies cough or congestion  Respiratory: denies any shortness of breath  Cardiovascular: denies chest pain or palpitations  Gastrointestinal: + distension (improved); denied abdominal pain, nausea, emesis  Genitourinary: denies burning with urination or urinary frequency Musculoskeletal: denies pain, decreased motor or sensation  Vital signs in last 24 hours: [min-max] current  Temp:  [97.8 F (36.6 C)-99.1 F (37.3 C)] 97.8 F (36.6 C) (12/19 0420) Pulse Rate:  [86-107] 107 (12/19 0420) Resp:  [18] 18 (12/19 0420) BP: (129-142)/(63-78) 142/71 (12/19 0420) SpO2:  [94 %-100 %] 100 % (12/18 1553)     Height: 5\' 11"  (180.3 cm) Weight: 96.5 kg BMI (Calculated): 29.68   Intake/Output last 2 shifts:  12/18 0701 - 12/19 0700 In: 90 [NG/GT:90] Out: 1400 [Emesis/NG output:1400]   Physical Exam:  Constitutional: alert, cooperative and no distress  HENT: normocephalic without obvious abnormality; NGT in place (removed) Eyes: PERRL, EOM's grossly intact and symmetric  Respiratory: breathing non-labored at rest  Cardiovascular: regular rate and sinus rhythm  Gastrointestinal: soft, non-tender, he remains distended. No rebound/guarding. He does have likely loss of domain with palpable mesh Musculoskeletal: no edema or wounds, motor  and sensation grossly intact, NT    Labs:     Latest Ref Rng & Units 06/14/2023    4:41 AM 06/13/2023    5:29 AM 06/11/2023   10:02 PM  CBC  WBC 4.0 - 10.5 K/uL 11.5  8.7  13.9   Hemoglobin 13.0 - 17.0 g/dL 34.7  42.5  95.6   Hematocrit 39.0 - 52.0 % 36.8  36.3  41.1   Platelets 150 - 400 K/uL 289  287  328       Latest Ref Rng & Units 06/14/2023    4:41 AM 06/13/2023    5:29 AM 06/11/2023   10:02 PM  CMP  Glucose 70 - 99 mg/dL 387  564  332   BUN 8 - 23 mg/dL 12  16  17    Creatinine 0.61 - 1.24 mg/dL 9.51  8.84  1.66   Sodium 135 - 145 mmol/L 130  131  128   Potassium 3.5 - 5.1 mmol/L 3.2  4.3  4.4   Chloride 98 - 111 mmol/L 94  96  92   CO2 22 - 32 mmol/L 26  26  29    Calcium 8.9 - 10.3 mg/dL 8.3  8.5  9.3   Total Protein 6.5 - 8.1 g/dL   7.3   Total Bilirubin <1.2 mg/dL   0.9   Alkaline Phos 38 - 126 U/L   76   AST 15 - 41 U/L   19   ALT 0 - 44 U/L   13      Imaging studies:   KUB with contrast (06/15/2023) personally reviewed with contrast reaching the rectum, still with a few loops of dilated small bowel, and  radiologist report reviewed below: IMPRESSION: 1. Contrast is seen throughout nondilated colon to the level the rectum. 2. Dilated air-filled small bowel loops persist measuring up to 4 cm may represent ileus or partial obstruction.   Assessment/Plan: (ICD-10's: K26.609) 89 y.o. male with clinically and radiographically resolved small bowel obstruction likely secondary to post-surgical adhesive disease, complicated by pertinent comorbidities including advanced age and significant previous intra-abdominal surgeries.   - He is having bowel function and has overtly passed his gastrografin challenge. As such, NGT removed at bedside this morning. We can cautiously start CLD today.               - No need for emergent surgical intervention. He understands if he were to fail to improve or clinically deteriorate, we would need to consider this. Certainly carries  increased risk given his previous intra-abdominal surgeries and age             - Replete electrolytes; monitor K+ - Monitor abdominal examination; on-going bowel function - Pain control prn; antiemetics prn - Mobilize as tolerated              - Further management per primary service; we will follow   All of the above findings and recommendations were discussed with the patient, and the medical team, and all of patient's questions were answered to his expressed satisfaction.  -- Lynden Oxford, PA-C [facility] 06/15/2023, 6:24 AM M-F: 7am - 4pm

## 2023-06-15 NOTE — Progress Notes (Signed)
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 Admitted July of this year similar problem, resolved with NG tube. Here CT scan shows small bowel dilation no discrete transition point so ileus vs SBO. NG tube placed in the ER. Gastrograffin started. Yesterday return of bowel function, NG tube removed today. - advancing diet  # Upper GI bleed? Dark fluid from NG tube appears to be blood-tinged. Hemoglobin stable in the 12s. Denies history GI bleed. Is on asa at home (held). Does take nsaids. Discussed w/ GI, no hurry to perform EGD, patient/daughter also wish to avoid - will plan to continue ppi, monitor clinically, close outpt f/u  # Acute on chronic hyponatremia Baseline low 130s, here 128 in setting of SBO, so suspect hypovolemia is the primary driver. Improved to 132  # CAD  S/p stent in 2016, asymptomatic. Declines statin - resume asa tbd  # T2DM Diet controlled, here glucose appropriate - daily fasting sugars with morning labs  # HTN Here bp appropriate - cont home metop and spiro  # Dementia with delirium A bit delirious overnight, resolved - cont home rivastigmine    # BPH - doxazosin substituted for home flomax so that it can be given via his NG tube  # Dispo Resides at [facility] - PT consulted, advising HH  DVT prophylaxis: scds Code Status: dnr, has signed form in room Family Communication:  daughter suzanne updated at bedside 12/19 Consults called: gen surg  Level of care: Med-Surg Status is: Inpatient Remains inpatient appropriate because: need for inpatient evaluation and monitoring  Consultants:  Gen surg  Procedures: Ng tube placement  Antimicrobials:  none    Subjective: No abd pain, passing flatus, had bm, working with PT, no pain  Objective: Vitals:   06/14/23 0753 06/14/23 1553 06/15/23 0420 06/15/23 0904  BP: 129/63 (!) 142/78 (!) 142/71 (!) 132/100  Pulse: 86 96 (!) 107 (!) 106  Resp: 18 18 18 16   Temp: 99.1 F (37.3 C) 97.9 F (36.6 C) 97.8 F (36.6 C) 97.9 F (36.6 C)  TempSrc: Oral  Oral   SpO2: 94% 100%  96%  Weight:      Height:        Intake/Output Summary (Last 24 hours) at 06/15/2023 1432 Last data filed at 06/15/2023 8413 Gross per 24 hour  Intake 30 ml  Output 1400 ml  Net -1370 ml   Filed Weights   06/12/23 0200 06/12/23 1917  Weight: 95.3 kg 96.5 kg    Examination:  Constitutional: No acute distress Head: Atraumatic Neck: Supple Respiratory: Clear to auscultation bilaterally  Cardiovascular: Regular rate and rhythm.   Abdomen: distended but improving, increased bowel sounds, non-tender, ventral hernia Musculoskeletal: No joint deformity upper and lower extremities.   Skin: No visible rashes, lesions, or ulcers.  Extremities: LLE edema patient says this is chronic Neurologic: Alert, moving all 4 extremities. Psychiatric: Normal insight and judgement.    Data Reviewed: I have  personally reviewed following labs and imaging studies  CBC: Recent Labs  Lab 06/11/23 2202 06/13/23 0529 06/14/23 0441 06/15/23 0626  WBC 13.9* 8.7 11.5* 11.8*  HGB 14.0 12.6* 12.8* 13.6  HCT 41.1 36.3* 36.8* 40.0  MCV 90.1 88.1 88.2 89.3  PLT 328 287 289 263   Basic Metabolic Panel: Recent Labs  Lab 06/11/23 2202 06/13/23 0529 06/14/23 0441 06/15/23 0626  NA 128* 131* 130* 132*  K 4.4 4.3 3.2* 3.1*  CL 92* 96* 94* 96*   CO2 29 26 26 24   GLUCOSE 153* 149* 161* 163*  BUN 17 16 12 13   CREATININE 0.84 0.88 0.74 0.98  CALCIUM 9.3 8.5* 8.3* 8.5*  MG  --   --   --  1.8   GFR: Estimated Creatinine Clearance: 47.5 mL/min (by C-G formula based on SCr of 0.98 mg/dL). Liver Function Tests: Recent Labs  Lab 06/11/23 2202  AST 19  ALT 13  ALKPHOS 76  BILITOT 0.9  PROT 7.3  ALBUMIN 3.8   Recent Labs  Lab 06/11/23 2202  LIPASE 26   No results for input(s): "AMMONIA" in the last 168 hours. Coagulation Profile: No results for input(s): "INR", "PROTIME" in the last 168 hours. Cardiac Enzymes: No results for input(s): "CKTOTAL", "CKMB", "CKMBINDEX", "TROPONINI" in the last 168 hours. BNP (last 3 results) No results for input(s): "PROBNP" in the last 8760 hours. HbA1C: No results for input(s): "HGBA1C" in the last 72 hours. CBG: Recent Labs  Lab 06/12/23 1915  GLUCAP 163*   Lipid Profile: No results for input(s): "CHOL", "HDL", "LDLCALC", "TRIG", "CHOLHDL", "LDLDIRECT" in the last 72 hours. Thyroid Function Tests: No results for input(s): "TSH", "T4TOTAL", "FREET4", "T3FREE", "THYROIDAB" in the last 72 hours. Anemia Panel: No results for input(s): "VITAMINB12", "FOLATE", "FERRITIN", "TIBC", "IRON", "RETICCTPCT" in the last 72 hours. Urine analysis:    Component Value Date/Time   COLORURINE YELLOW (A) 06/12/2023 0038   APPEARANCEUR CLEAR (A) 06/12/2023 0038   LABSPEC >1.046 (H) 06/12/2023 0038   PHURINE 5.0 06/12/2023 0038   GLUCOSEU NEGATIVE 06/12/2023 0038   HGBUR NEGATIVE 06/12/2023 0038   BILIRUBINUR NEGATIVE 06/12/2023 0038   KETONESUR NEGATIVE 06/12/2023 0038   PROTEINUR NEGATIVE 06/12/2023 0038   NITRITE NEGATIVE 06/12/2023 0038   LEUKOCYTESUR NEGATIVE 06/12/2023 0038   Sepsis Labs: @LABRCNTIP (procalcitonin:4,lacticidven:4)  ) Recent Results (from the past 240 hours)  SARS Coronavirus 2 by RT PCR (hospital order, performed in [facility] hospital lab) *cepheid single result test*  Anterior Nasal Swab     Status: None   Collection Time: 06/14/23  4:42 PM   Specimen: Anterior Nasal Swab  Result Value Ref Range Status   SARS Coronavirus 2 by RT PCR NEGATIVE NEGATIVE Final    Comment: (NOTE) SARS-CoV-2 target nucleic acids are NOT DETECTED.  The SARS-CoV-2 RNA is generally detectable in upper and lower respiratory specimens during the acute phase of infection. The lowest concentration of SARS-CoV-2 viral copies this assay can detect is 250 copies / mL. A negative result does not preclude SARS-CoV-2 infection and should not be used as the sole basis for treatment or other patient management decisions.  A negative result may occur with improper specimen collection / handling, submission of specimen other than nasopharyngeal swab, presence of viral mutation(s) within the areas targeted by this assay, and inadequate number of viral copies (<250 copies / mL). A negative result must be combined with clinical observations, patient history, and epidemiological information.  Fact Sheet for Patients:   RoadLapTop.co.za  Fact Sheet for  Healthcare Providers: http://kim-miller.com/  This test is not yet approved or  cleared by the [place] and has been authorized for detection and/or diagnosis of SARS-CoV-2 by FDA under an Emergency Use Authorization (EUA).  This EUA will remain in effect (meaning this test can be used) for the duration of the COVID-19 declaration under Section 564(b)(1) of the Act, 21 U.S.C. section 360bbb-3(b)(1), unless the authorization is terminated or revoked sooner.  Performed at [facility], [address], [place], [place] [address]          Radiology Studies: DG Abd Portable 1V-Small Bowel Obstruction Protocol-initial, 8 hr delay Result Date: 06/15/2023 CLINICAL DATA:  8 hour delay for small bowel obstruction. EXAM: PORTABLE ABDOMEN - 1 VIEW COMPARISON:  Abdominal series  06/14/2023. FINDINGS: Contrast is seen throughout nondilated colon to the level the rectum. Dilated air-filled small bowel loops persist measuring up to 4 cm. Enteric tube tip is in the body of the stomach, unchanged. Surgical coils overlie the abdomen, unchanged. Left hip arthroplasty is present. IMPRESSION: 1. Contrast is seen throughout nondilated colon to the level the rectum. 2. Dilated air-filled small bowel loops persist measuring up to 4 cm may represent ileus or partial obstruction. Electronically Signed   By: Darliss Cheney M.D.   On: 06/15/2023 01:20   DG Chest Port 1 View Result Date: 06/14/2023 CLINICAL DATA:  Cough EXAM: PORTABLE CHEST 1 VIEW COMPARISON:  06/14/2023, 04/22/2021 FINDINGS: Enteric tube tip below the diaphragm but incompletely visualized. Small loculated right pleural effusion without significant change. Minimal atelectasis left base. Stable cardiomediastinal silhouette. No pneumothorax IMPRESSION: Small loculated right pleural effusion without significant change. Minimal atelectasis left base. Electronically Signed   By: Jasmine Pang M.D.   On: 06/14/2023 18:44   DG ABD ACUTE 2+V W 1V CHEST Result Date: 06/14/2023 CLINICAL DATA:  Small-bowel obstruction EXAM: DG ABDOMEN ACUTE WITH 1 VIEW CHEST COMPARISON:  06/13/2023 FINDINGS: Supine and upright frontal views of the abdomen and pelvis as well as an upright frontal view of the chest are obtained. Cardiac silhouette is unremarkable. Small partially loculated right pleural effusion unchanged. No airspace disease or pneumothorax. Enteric catheter passes below diaphragm, tip projecting over the gastric body. Mildly distended gas-filled loops of small bowel within the central abdomen measuring up to 4 cm. Stable gas and stool throughout the colon. No masses or abnormal calcifications. No free gas in the greater peritoneal sac. IMPRESSION: 1. Stable gaseous distention of the small bowel within the central abdomen, compatible with  small-bowel obstruction. 2. Enteric catheter passing below diaphragm, tip and side port projecting over the region of the gastric body. 3. Small right pleural effusion unchanged. Electronically Signed   By: Sharlet Salina M.D.   On: 06/14/2023 10:38        Scheduled Meds:  finasteride  5 mg Oral QPM   metoprolol succinate  50 mg Oral Daily   pantoprazole (PROTONIX) IV  40 mg Intravenous Q24H   potassium chloride  60 mEq Oral Once   rivastigmine  1.5 mg Oral BID   spironolactone  25 mg Oral Daily   tamsulosin  0.4 mg Oral QPM   traZODone  50 mg Oral QHS   Continuous Infusions:  magnesium sulfate bolus IVPB       LOS: 3 days     Silvano Bilis, MD [facility]   If 7PM-7AM, please contact night-coverage [email or web address] Password [email or web address] 06/15/2023, 2:32 PM

## 2023-06-15 NOTE — Progress Notes (Signed)
Physical Therapy Treatment Patient Details Name: Tyler Pena MRN: [id number] DOB: [birth year] Today's Date: 06/15/2023   History of Present Illness Pt is a 89 y.o. 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    PT Comments  Patient progressing towards physical therapy goals. Pleasant throughout session. Able to ambulate 200' with RW and CGA-supervision. Once returned to room, patient able to perform sit to stand x 3 consecutively with supervision. Daughter in room throughout session. Discharge plan remains appropriate.     If plan is discharge home, recommend the following: A little help with walking and/or transfers;A little help with bathing/dressing/bathroom;Assist for transportation;Assistance with cooking/housework   Can travel by private vehicle        Equipment Recommendations  Other (comment) (TBD)    Recommendations for Other Services       Precautions / Restrictions Precautions Precautions: Fall Restrictions Weight Bearing Restrictions Per Provider Order: No     Mobility  Bed Mobility               General bed mobility comments: in bathroom on arrival    Transfers Overall transfer level: Needs assistance Equipment used: Rolling Stepen Prins (2 wheels) Transfers: Sit to/from Stand Sit to Stand: Supervision           General transfer comment: supervision for safety. Able to complete sit to stand x 3 consecutively.    Ambulation/Gait Ambulation/Gait assistance: Contact guard assist, Supervision Gait Distance (Feet): 200 Feet Assistive device: Rolling Giovanne Nickolson (2 wheels) Gait Pattern/deviations: Step-through pattern, Decreased stride length, Shuffle Gait velocity: decreased     General Gait Details: intermittent shuffling but otherwise good step length. No LOB noted   Stairs              Wheelchair Mobility     Tilt Bed    Modified Rankin (Stroke Patients Only)       Balance Overall balance assessment: Needs assistance Sitting-balance support: Feet supported Sitting balance-Leahy Scale: Good     Standing balance support: Single extremity supported, During functional activity Standing balance-Leahy Scale: Fair                              Cognition Arousal: Alert Behavior During Therapy: WFL for tasks assessed/performed Overall Cognitive Status: Within Functional Limits for tasks assessed                                 General Comments: hx of Lewy Body dementia        Exercises      General Comments        Pertinent Vitals/Pain Pain Assessment Pain Assessment: No/denies pain    Home Living                          Prior Function            PT Goals (current goals can now be found in the care plan section) Acute Rehab PT Goals Patient Stated Goal: to get his strength back PT Goal Formulation: With patient Time For Goal Achievement: 06/27/23 Potential to Achieve Goals: Good Progress towards PT goals: Progressing toward goals    Frequency    Min 1X/week      PT Plan      Co-evaluation  AM-PAC PT "6 Clicks" Mobility   Outcome Measure  Help needed turning from your back to your side while in a flat bed without using bedrails?: A Little Help needed moving from lying on your back to sitting on the side of a flat bed without using bedrails?: A Little Help needed moving to and from a bed to a chair (including a wheelchair)?: A Little Help needed standing up from a chair using your arms (e.g., wheelchair or bedside chair)?: A Little Help needed to walk in hospital room?: A Little Help needed climbing 3-5 steps with a railing? : A Little 6 Click Score: 18    End of Session   Activity Tolerance: Patient tolerated treatment well Patient left: in chair;with call bell/phone  within reach;with family/visitor present;Other (comment) (MD present) Nurse Communication: Mobility status PT Visit Diagnosis: Other abnormalities of gait and mobility (R26.89);Muscle weakness (generalized) (M62.81);Difficulty in walking, not elsewhere classified (R26.2)     Time: 1610-9604 PT Time Calculation (min) (ACUTE ONLY): 29 min  Charges:    $Therapeutic Activity: 23-37 mins PT General Charges $$ ACUTE PT VISIT: 1 Visit                     Tyler Pena, PT, DPT Physical Therapist - [facility]  [facility]    Rheannon Cerney A Demetrice Amstutz 06/15/2023, 2:50 PM

## 2023-06-15 NOTE — Care Management Important Message (Signed)
Important Message  Patient Details  Name: Tyler Pena MRN: [id number] Date of Birth: 07-02-1920   Important Message Given:  Yes - Medicare IM     [name] 06/15/2023, 9:51 AM

## 2023-06-15 NOTE — Progress Notes (Signed)
Mobility Specialist - Progress Note     06/15/23 1546  Mobility  Activity Stood at bedside;Ambulated with assistance in room  Level of Assistance Standby assist, set-up cues, supervision of patient - no hands on  Assistive Device Front wheel walker  Distance Ambulated (ft) 5 ft  Range of Motion/Exercises Active  Activity Response Tolerated well  Mobility Referral Yes  Mobility visit 1 Mobility   Pt resting in recliner on RA upon entry. Pt STS and ambulates in room to end of bed before returning to bed. Pt left in bed with needs in reach and bed alarm activated   Johnathan Hausen Mobility Specialist 06/15/23, 4:23 PM

## 2023-06-16 ENCOUNTER — Telehealth: Payer: Self-pay | Admitting: Student

## 2023-06-16 DIAGNOSIS — K56609 Unspecified intestinal obstruction, unspecified as to partial versus complete obstruction: Secondary | ICD-10-CM | POA: Diagnosis not present

## 2023-06-16 LAB — CBC
HCT: 34 % — ABNORMAL LOW (ref 39.0–52.0)
Hemoglobin: 11.6 g/dL — ABNORMAL LOW (ref 13.0–17.0)
MCH: 30.3 pg (ref 26.0–34.0)
MCHC: 34.1 g/dL (ref 30.0–36.0)
MCV: 88.8 fL (ref 80.0–100.0)
Platelets: 258 10*3/uL (ref 150–400)
RBC: 3.83 MIL/uL — ABNORMAL LOW (ref 4.22–5.81)
RDW: 13.6 % (ref 11.5–15.5)
WBC: 7.7 10*3/uL (ref 4.0–10.5)
nRBC: 0 % (ref 0.0–0.2)

## 2023-06-16 LAB — BASIC METABOLIC PANEL
Anion gap: 8 (ref 5–15)
BUN: 15 mg/dL (ref 8–23)
CO2: 28 mmol/L (ref 22–32)
Calcium: 8.3 mg/dL — ABNORMAL LOW (ref 8.9–10.3)
Chloride: 99 mmol/L (ref 98–111)
Creatinine, Ser: 0.72 mg/dL (ref 0.61–1.24)
GFR, Estimated: >60 mL/min (ref >60)
Glucose, Bld: 122 mg/dL — ABNORMAL HIGH (ref 70–99)
Potassium: 3.7 mmol/L (ref 3.5–5.1)
Sodium: 135 mmol/L (ref 135–145)

## 2023-06-16 NOTE — TOC Transition Note (Signed)
Transition of Care [facility]) - Discharge Note   Patient Details  Name: Tyler Pena MRN: [id number] Date of Birth: 1920-10-27  [facility] CM/SW Contact:  Chapman Fitch, RN Phone Number: 06/16/2023, 11:22 AM   Clinical Narrative:      Patient will DC to: [facility] Anticipated DC date: 06/16/23  Family notified: MD has updated daughter Transport by: Per MD daughter to transport  Per MD patient ready for DC to . RN, patient, patient's family, and facility notified of DC. Discharge Summary sent to facility. RN given number for report. DC packet on chart.  Signed DNR on chart TOC signing off.        Patient Goals and CMS Choice            Discharge Placement                       Discharge Plan and Services Additional resources added to the After Visit Summary for                                       Social Drivers of Health (SDOH) Interventions SDOH Screenings   Food Insecurity: No Food Insecurity (06/12/2023)  Housing: Low Risk  (06/12/2023)  Transportation Needs: No Transportation Needs (06/12/2023)  Utilities: Not At Risk (06/12/2023)  Financial Resource Strain: Low Risk  (04/07/2023)   Received from [facility]  Tobacco Use: Low Risk  (06/11/2023)     Readmission Risk Interventions     No data to display

## 2023-06-16 NOTE — Progress Notes (Signed)
[facility]ESS NOTE (cpt 252 010 9129)  Hospital Day(s): 4.   Interval History: Patient seen and examined, no acute events or new complaints overnight. Patient reports he is doing great. He is having no issues this AM. No fever, chills, nausea, emesis, or abdominal pain. Previously noted leukocytosis resolved; WBC now 7.7K. Hgb to 11.6. Renal function normal; sCr - 0.72; UO - unmeasured. No electrolyte derangements. Diet advanced to FLD; tolerating well. He continues to have bowel function, says "my bowel movements are as normal as they have ever been."  Review of Systems:  Constitutional: denies fever, chills  HEENT: denies cough or congestion  Respiratory: denies any shortness of breath  Cardiovascular: denies chest pain or palpitations  Gastrointestinal: denied abdominal pain, nausea, emesis  Genitourinary: denies burning with urination or urinary frequency Musculoskeletal: denies pain, decreased motor or sensation  Vital signs in last 24 hours: [min-max] current  Temp:  [97.9 F (36.6 C)-98.5 F (36.9 C)] 97.9 F (36.6 C) (12/20 0301) Pulse Rate:  [66-106] 73 (12/20 0301) Resp:  [16-18] 18 (12/20 0301) BP: (111-135)/(51-100) 135/69 (12/20 0301) SpO2:  [96 %] 96 % (12/20 0301)     Height: 5\' 11"  (180.3 cm) Weight: 96.5 kg BMI (Calculated): 29.68   Intake/Output last 2 shifts:  No intake/output data recorded.   Physical Exam:  Constitutional: alert, cooperative and no distress  HENT: normocephalic without obvious abnormality Eyes: PERRL, EOM's grossly intact and symmetric  Respiratory: breathing non-labored at rest  Cardiovascular: regular rate and sinus rhythm  Gastrointestinal: soft, non-tender, he remains distended. No rebound/guarding. He does have likely loss of domain with palpable mesh Musculoskeletal: no edema or wounds, motor and sensation grossly intact, NT    Labs:     Latest Ref Rng & Units 06/16/2023    5:32 AM 06/15/2023    6:26 AM  06/14/2023    4:41 AM  CBC  WBC 4.0 - 10.5 K/uL 7.7  11.8  11.5   Hemoglobin 13.0 - 17.0 g/dL 25.3  66.4  40.3   Hematocrit 39.0 - 52.0 % 34.0  40.0  36.8   Platelets 150 - 400 K/uL 258  263  289       Latest Ref Rng & Units 06/16/2023    5:32 AM 06/15/2023    6:26 AM 06/14/2023    4:41 AM  CMP  Glucose 70 - 99 mg/dL 474  259  563   BUN 8 - 23 mg/dL 15  13  12    Creatinine 0.61 - 1.24 mg/dL 8.75  6.43  3.29   Sodium 135 - 145 mmol/L 135  132  130   Potassium 3.5 - 5.1 mmol/L 3.7  3.1  3.2   Chloride 98 - 111 mmol/L 99  96  94   CO2 22 - 32 mmol/L 28  24  26    Calcium 8.9 - 10.3 mg/dL 8.3  8.5  8.3      Imaging studies:  No new imaging studies    Assessment/Plan: (ICD-10's: K56.609) 89 y.o. male with clinically and radiographically resolved small bowel obstruction likely secondary to post-surgical adhesive disease, complicated by pertinent comorbidities including advanced age and significant previous intra-abdominal surgeries.   - Advance to soft diet             - No need for emergent surgical intervention.  - Monitor abdominal examination; on-going bowel function - Pain control prn; antiemetics prn - Mobilize as tolerated              -  Further management per primary service   - Discharge Planning: He is doing well, no issues. Will start soft diet today. If he tolerates breakfast +/- lunch, he can DC home today.   All of the above findings and recommendations were discussed with the patient, and the medical team, and all of patient's questions were answered to his expressed satisfaction.  -- Lynden Oxford, PA-C [facility] 06/16/2023, 7:13 AM M-F: 7am - 4pm

## 2023-06-16 NOTE — Discharge Summary (Addendum)
Tyler Pena EPP:[id number] DOB: 07-01-1920 DOA: 06/12/2023  PCP: Marisue Ivan, MD  Admit date: 06/12/2023 Discharge date: 06/16/2023  Time spent: 35 minutes  Recommendations for Outpatient Follow-up:  Pcp f/u, consider GI referral      Discharge Diagnoses:  Principal Problem:   SBO (small bowel obstruction) (HCC) Active Problems:   Benign prostatic hyperplasia with weak urinary stream   Coronary artery disease involving native coronary artery of native heart without angina pectoris   Essential hypertension   Diabetes mellitus type 2, diet-controlled (HCC)   Hyponatremia   Discharge Condition: stable  Diet recommendation: heart healthy  Filed Weights   06/12/23 0200 06/12/23 1917  Weight: 95.3 kg 96.5 kg    History of present illness:  From admission h and p Tyler Pena is a 89 y.o. male with medical history significant for cad, dm, htn, recurrent sbo, who presents with the above.   For a few days felt generally unwell but no specific symptoms. Then beginning yesterday morning developed abdominal distention and discomfort. The discomfort is generalized but is b/l and more upper than lower. Also developed some nausea with a lot of eructation, no vomiting. Had a lot of stooling the day prior to this, last stool was yesterday morning (12/15), small in volume. Has passed a little bit of flatus in the last 24 hours. No chest pain or fevers. No cough.  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 Gen surg consulted. NG tube placed. NPO initially. Bowel function returned, ng tube removed, diet advanced, now tolerating solids. Dark possibly bloody material in ng tube, hgb stable no significant anemia, GI offered scope but patient/family declined, prefer to monitor clinically outpatient. Will advise stopping nsaids as outpt and continue ppi. Hyponatremic on presentation, this resolved by discharge. Other chronic conditions  stable.  Procedures: NG tube placement    Consultations: Gen surg  Discharge Exam: Vitals:   06/16/23 0301 06/16/23 0831  BP: 135/69 126/70  Pulse: 73 84  Resp: 18 16  Temp: 97.9 F (36.6 C) 98.4 F (36.9 C)  SpO2: 96% 100%    General: NAD Cardiovascular: RRR Respiratory: CTAB Abdomen: soft, normal BSs  Discharge Instructions   Discharge Instructions     Diet - low sodium heart healthy   Complete by: As directed    Increase activity slowly   Complete by: As directed       Allergies as of 06/16/2023       Reactions   Fenofibrate    Other reaction(s): Muscle Pain   Losartan Other (See Comments)   Caused weakness, chest pressure, and shortness of breath        Medication List     STOP taking these medications    naproxen 500 MG tablet Commonly known as: NAPROSYN       TAKE these medications    acetaminophen 500 MG tablet Commonly known as: TYLENOL Take by mouth.   acetaminophen 325 MG tablet Commonly known as: TYLENOL Take 650 mg by mouth every 4 (four) hours as needed.   aluminum-magnesium hydroxide-simethicone 200-200-20 MG/5ML Susp Commonly known as: MAALOX Take 30 mLs by mouth 4 (four) times daily as needed.   Ascorbic Acid 500 MG Caps Take 500 mg by mouth daily.   aspirin EC 81 MG tablet Take 81 mg by mouth daily.   cetirizine 10 MG tablet Commonly known as: ZYRTEC Take 10 mg by mouth daily as needed for allergies.   Cholecalciferol 50 MCG (2000 UT) Tabs  Take 1 tablet by mouth daily.   Cinnamon 500 MG capsule Take 1,000 mg by mouth in the morning and at bedtime.   Coenzyme Q10 100 MG capsule Take 100 mg by mouth daily.   cyanocobalamin 1000 MCG tablet Take 1 tablet (1,000 mcg total) by mouth daily.   dextromethorphan-guaiFENesin 10-100 MG/5ML liquid Commonly known as: ROBITUSSIN-DM Take 10 mLs by mouth every 4 (four) hours as needed for cough.   diphenhydrAMINE 25 mg capsule Commonly known as: BENADRYL Take 25 mg  by mouth every 6 (six) hours as needed.   finasteride 5 MG tablet Commonly known as: PROSCAR Take 5 mg by mouth every evening.   furosemide 40 MG tablet Commonly known as: LASIX Take 40 mg by mouth daily as needed.   iron polysaccharides 150 MG capsule Commonly known as: NIFEREX Take 1 capsule (150 mg total) by mouth daily.   Melatonin Maximum Strength 5 MG Tabs Generic drug: melatonin Take 5 mg by mouth at bedtime as needed.   metoprolol succinate 50 MG 24 hr tablet Commonly known as: TOPROL-XL Take 1 tablet (50 mg total) by mouth daily.   multivitamin with minerals Tabs tablet Take 1 tablet by mouth daily.   nystatin powder Apply 1 Application topically daily as needed.   ondansetron 4 MG tablet Commonly known as: ZOFRAN Take 4 mg by mouth daily as needed for nausea or vomiting.   pantoprazole 40 MG tablet Commonly known as: PROTONIX Take 1 tablet (40 mg total) by mouth daily.   polyethylene glycol powder 17 GM/SCOOP powder Commonly known as: GLYCOLAX/MIRALAX Take by mouth daily as needed.   potassium chloride SA 20 MEQ tablet Commonly known as: KLOR-CON M Take 20 mEq by mouth daily.   PRESERVISION AREDS 2 PO Take 1 tablet by mouth 2 (two) times daily.   rivastigmine 1.5 MG capsule Commonly known as: EXELON Take 1.5 mg by mouth 2 (two) times daily.   spironolactone 25 MG tablet Commonly known as: ALDACTONE Take 25 mg by mouth daily.   Super Omega 3 EPA/DHA 1000 MG Caps Take 1 capsule by mouth daily. Omega 3 - DHA - EPA - Fish oil-- 1000mg / 120mg / 180mg    tamsulosin 0.4 MG Caps capsule Commonly known as: FLOMAX Take 0.4 mg by mouth every evening.   traZODone 50 MG tablet Commonly known as: DESYREL Take 50 mg by mouth at bedtime.       Allergies  Allergen Reactions   Fenofibrate     Other reaction(s): Muscle Pain   Losartan Other (See Comments)    Caused weakness, chest pressure, and shortness of breath    Follow-up Information      Marisue Ivan, MD Follow up.   Specialty: Family Medicine Contact information: [address] [facility] [place] [address] [phone]                  The results of significant diagnostics from this hospitalization (including imaging, microbiology, ancillary and laboratory) are listed below for reference.    Significant Diagnostic Studies: DG Abd Portable 1V-Small Bowel Obstruction Protocol-initial, 8 hr delay Result Date: 06/15/2023 CLINICAL DATA:  8 hour delay for small bowel obstruction. EXAM: PORTABLE ABDOMEN - 1 VIEW COMPARISON:  Abdominal series 06/14/2023. FINDINGS: Contrast is seen throughout nondilated colon to the level the rectum. Dilated air-filled small bowel loops persist measuring up to 4 cm. Enteric tube tip is in the body of the stomach, unchanged. Surgical coils overlie the abdomen, unchanged. Left hip arthroplasty is present. IMPRESSION:  1. Contrast is seen throughout nondilated colon to the level the rectum. 2. Dilated air-filled small bowel loops persist measuring up to 4 cm may represent ileus or partial obstruction. Electronically Signed   By: Darliss Cheney M.D.   On: 06/15/2023 01:20   DG Chest Port 1 View Result Date: 06/14/2023 CLINICAL DATA:  Cough EXAM: PORTABLE CHEST 1 VIEW COMPARISON:  06/14/2023, 04/22/2021 FINDINGS: Enteric tube tip below the diaphragm but incompletely visualized. Small loculated right pleural effusion without significant change. Minimal atelectasis left base. Stable cardiomediastinal silhouette. No pneumothorax IMPRESSION: Small loculated right pleural effusion without significant change. Minimal atelectasis left base. Electronically Signed   By: Jasmine Pang M.D.   On: 06/14/2023 18:44   DG ABD ACUTE 2+V W 1V CHEST Result Date: 06/14/2023 CLINICAL DATA:  Small-bowel obstruction EXAM: DG ABDOMEN ACUTE WITH 1 VIEW CHEST COMPARISON:  06/13/2023 FINDINGS: Supine and upright frontal views of the abdomen and  pelvis as well as an upright frontal view of the chest are obtained. Cardiac silhouette is unremarkable. Small partially loculated right pleural effusion unchanged. No airspace disease or pneumothorax. Enteric catheter passes below diaphragm, tip projecting over the gastric body. Mildly distended gas-filled loops of small bowel within the central abdomen measuring up to 4 cm. Stable gas and stool throughout the colon. No masses or abnormal calcifications. No free gas in the greater peritoneal sac. IMPRESSION: 1. Stable gaseous distention of the small bowel within the central abdomen, compatible with small-bowel obstruction. 2. Enteric catheter passing below diaphragm, tip and side port projecting over the region of the gastric body. 3. Small right pleural effusion unchanged. Electronically Signed   By: Sharlet Salina M.D.   On: 06/14/2023 10:38   DG Abd 2 Views Result Date: 06/13/2023 CLINICAL DATA:  Small-bowel obstruction EXAM: ABDOMEN - 2 VIEW COMPARISON:  06/12/2023 FINDINGS: Supine and upright frontal views of the abdomen and pelvis are obtained. Enteric catheter tip and side port project over the distal thoracic esophagus likely within the known small hiatal hernia, recommend advancing at least 4 cm to ensure side port placement within the subdiaphragmatic stomach. Distended gas-filled loops of small bowel are seen primarily within the left mid abdomen, measuring up to 4 cm in diameter. Minimal gas and stool again seen throughout the colon. No free gas in the greater peritoneal sac. Trace right pleural effusion again noted. Otherwise the lung bases are clear. IMPRESSION: 1. Stable gaseous distension of the proximal small bowel consistent with small-bowel obstruction. 2. Enteric catheter tip projecting over the distal thoracic esophagus in the region of the known small hiatal hernia. Recommend advancing 4 cm to ensure side port placement within the gastric lumen. 3. Small right pleural effusion.  Electronically Signed   By: Sharlet Salina M.D.   On: 06/13/2023 09:45   DG Abdomen 1 View Result Date: 06/12/2023 CLINICAL DATA:  Confirm NG tube placement EXAM: ABDOMEN - 1 VIEW COMPARISON:  Radiograph 06/12/2023 at 6:23 a.m. FINDINGS: Underpenetrated radiograph. NG tube tip projects over the expected location of the stomach. The side-port is not confidently visualized. IMPRESSION: NG tube tip projects over the expected location of the stomach. Side port is not definitively visualized. Electronically Signed   By: Minerva Fester M.D.   On: 06/12/2023 20:04   DG Abdomen 1 View Result Date: 06/12/2023 CLINICAL DATA:  NG tube placement EXAM: ABDOMEN - 1 VIEW COMPARISON:  06/11/2023 FINDINGS: Interval placement of enteric tube with tip and side port below the level of the GE junction. Mild gaseous distension  of the stomach. Persistent mild small bowel dilatation within the left hemiabdomen. Fasteners for ventral abdominal wall hernia mesh are again noted. Multilevel thoracolumbar spondylosis. IMPRESSION: 1. Interval placement of enteric tube with tip and side port below the level of the GE junction. 2. Persistent mild small bowel dilatation within the left hemiabdomen. Electronically Signed   By: Signa Kell M.D.   On: 06/12/2023 06:34   CT ABDOMEN PELVIS W CONTRAST Result Date: 06/11/2023 CLINICAL DATA:  Abdominal pain and distention EXAM: CT ABDOMEN AND PELVIS WITH CONTRAST TECHNIQUE: Multidetector CT imaging of the abdomen and pelvis was performed using the standard protocol following bolus administration of intravenous contrast. RADIATION DOSE REDUCTION: This exam was performed according to the departmental dose-optimization program which includes automated exposure control, adjustment of the mA and/or kV according to patient size and/or use of iterative reconstruction technique. CONTRAST:  OMNIPAQUE IOHEXOL 300 MG/ML  SOLN COMPARISON:  CT abdomen and pelvis 12/26/2022 FINDINGS: Lower chest:  Unchanged distention of the distal esophagus with small hiatal hernia and fluid refluxed into the esophagus. Unchanged small loculated right pleural effusion and associated airspace opacities. Bronchial wall thickening and mucous plugging in the right lower lobe. Hepatobiliary: No acute abnormality. Cholecystectomy. No biliary dilation. Pancreas: Fatty atrophy.  No acute abnormality. Spleen: Unremarkable. Adrenals/Urinary Tract: Stable adrenal glands and kidneys. No urinary calculi or hydronephrosis. Unremarkable bladder. Stomach/bowel: Mild dilation of loops of small bowel in the left hemiabdomen measuring up to 3.3 cm in diameter. There is smooth tapering to the ileum which is decompressed. No discrete transition point is identified. Normal caliber colon without wall thickening. Extensive colonic diverticulosis. No diverticulitis. The appendix is not definitively visualized. Vascular/Lymphatic: Aortic atherosclerosis. No enlarged abdominal or pelvic lymph nodes. Reproductive: Enlarged prostate. Other: Mesenteric edema and trace fluid about the dilated loops of small bowel. No free intraperitoneal air. Postoperative change about the ventral abdominal wall. Musculoskeletal: Left THA.  No acute fracture. IMPRESSION: 1. Mild dilation of loops of small bowel in the left hemiabdomen measuring up to 3.3 cm in diameter. There is smooth tapering to the ileum which is decompressed. No discrete transition point is identified. Findings may represent ileus though early or partial small bowel obstruction is difficult to exclude. 2. Unchanged small loculated right pleural effusion and associated airspace opacities. Bronchial wall thickening and mucous plugging in the right lower lobe. 3. Unchanged distention of the distal esophagus with small hiatal hernia and fluid refluxed into the esophagus. Aortic Atherosclerosis (ICD10-I70.0). Electronically Signed   By: Minerva Fester M.D.   On: 06/11/2023 23:20     Microbiology: Recent Results (from the past 240 hours)  SARS Coronavirus 2 by RT PCR (hospital order, performed in [facility] hospital lab) *cepheid single result test* Anterior Nasal Swab     Status: None   Collection Time: 06/14/23  4:42 PM   Specimen: Anterior Nasal Swab  Result Value Ref Range Status   SARS Coronavirus 2 by RT PCR NEGATIVE NEGATIVE Final    Comment: (NOTE) SARS-CoV-2 target nucleic acids are NOT DETECTED.  The SARS-CoV-2 RNA is generally detectable in upper and lower respiratory specimens during the acute phase of infection. The lowest concentration of SARS-CoV-2 viral copies this assay can detect is 250 copies / mL. A negative result does not preclude SARS-CoV-2 infection and should not be used as the sole basis for treatment or other patient management decisions.  A negative result may occur with improper specimen collection / handling, submission of specimen other than nasopharyngeal swab, presence  of viral mutation(s) within the areas targeted by this assay, and inadequate number of viral copies (<250 copies / mL). A negative result must be combined with clinical observations, patient history, and epidemiological information.  Fact Sheet for Patients:   RoadLapTop.co.za  Fact Sheet for Healthcare Providers: http://kim-miller.com/  This test is not yet approved or  cleared by the [place] FDA and has been authorized for detection and/or diagnosis of SARS-CoV-2 by FDA under an Emergency Use Authorization (EUA).  This EUA will remain in effect (meaning this test can be used) for the duration of the COVID-19 declaration under Section 564(b)(1) of the Act, 21 U.S.C. section 360bbb-3(b)(1), unless the authorization is terminated or revoked sooner.  Performed at [facility], [address] [place], [place] [address]      Labs: Basic Metabolic Panel: Recent Labs  Lab 06/11/23 2202  06/13/23 0529 06/14/23 0441 06/15/23 0626 06/16/23 0532  NA 128* 131* 130* 132* 135  K 4.4 4.3 3.2* 3.1* 3.7  CL 92* 96* 94* 96* 99  CO2 29 26 26 24 28   GLUCOSE 153* 149* 161* 163* 122*  BUN 17 16 12 13 15   CREATININE 0.84 0.88 0.74 0.98 0.72  CALCIUM 9.3 8.5* 8.3* 8.5* 8.3*  MG  --   --   --  1.8  --    Liver Function Tests: Recent Labs  Lab 06/11/23 2202  AST 19  ALT 13  ALKPHOS 76  BILITOT 0.9  PROT 7.3  ALBUMIN 3.8   Recent Labs  Lab 06/11/23 2202  LIPASE 26   No results for input(s): "AMMONIA" in the last 168 hours. CBC: Recent Labs  Lab 06/11/23 2202 06/13/23 0529 06/14/23 0441 06/15/23 0626 06/16/23 0532  WBC 13.9* 8.7 11.5* 11.8* 7.7  HGB 14.0 12.6* 12.8* 13.6 11.6*  HCT 41.1 36.3* 36.8* 40.0 34.0*  MCV 90.1 88.1 88.2 89.3 88.8  PLT 328 287 289 263 258   Cardiac Enzymes: No results for input(s): "CKTOTAL", "CKMB", "CKMBINDEX", "TROPONINI" in the last 168 hours. BNP: BNP (last 3 results) No results for input(s): "BNP" in the last 8760 hours.  ProBNP (last 3 results) No results for input(s): "PROBNP" in the last 8760 hours.  CBG: Recent Labs  Lab 06/12/23 1915  GLUCAP 163*       Signed:  Silvano Bilis MD.  [facility] 06/16/2023, 11:17 AM

## 2023-06-16 NOTE — Progress Notes (Signed)
Tyler Pena to be D/C'd Skilled nursing facility at [facility] per MD order.  Discussed prescriptions and follow up appointments with the patient's daughter. Attempted to call report to [facility]. No answer. Voicemail was left and unit number was written on the front of patient's discharge packet. Prescriptions given to patient's daughter, medication list explained in detail. Daughter verbalized understanding.  Allergies as of 06/16/2023       Reactions   Fenofibrate    Other reaction(s): Muscle Pain   Losartan Other (See Comments)   Caused weakness, chest pressure, and shortness of breath        Medication List     STOP taking these medications    naproxen 500 MG tablet Commonly known as: NAPROSYN       TAKE these medications    acetaminophen 325 MG tablet Commonly known as: TYLENOL Take 650 mg by mouth every 4 (four) hours as needed.   Ascorbic Acid 500 MG Caps Take 500 mg by mouth daily.   aspirin EC 81 MG tablet Take 81 mg by mouth daily.   cetirizine 10 MG tablet Commonly known as: ZYRTEC Take 10 mg by mouth daily as needed for allergies.   Cholecalciferol 50 MCG (2000 UT) Tabs Take 1 tablet by mouth daily.   Cinnamon 500 MG capsule Take 1,000 mg by mouth in the morning and at bedtime.   Coenzyme Q10 100 MG capsule Take 100 mg by mouth daily.   cyanocobalamin 1000 MCG tablet Take 1 tablet (1,000 mcg total) by mouth daily.   finasteride 5 MG tablet Commonly known as: PROSCAR Take 5 mg by mouth every evening.   iron polysaccharides 150 MG capsule Commonly known as: NIFEREX Take 1 capsule (150 mg total) by mouth daily.   Melatonin Maximum Strength 5 MG Tabs Generic drug: melatonin Take 5 mg by mouth at bedtime as needed.   metoprolol succinate 50 MG 24 hr tablet Commonly known as: TOPROL-XL Take 1 tablet (50 mg total) by mouth daily.   multivitamin with minerals Tabs tablet Take 1 tablet by mouth daily.   ondansetron 4 MG tablet Commonly  known as: ZOFRAN Take 4 mg by mouth daily as needed for nausea or vomiting.   pantoprazole 40 MG tablet Commonly known as: PROTONIX Take 1 tablet (40 mg total) by mouth daily.   polyethylene glycol powder 17 GM/SCOOP powder Commonly known as: GLYCOLAX/MIRALAX Take by mouth daily as needed.   potassium chloride SA 20 MEQ tablet Commonly known as: KLOR-CON M Take 20 mEq by mouth daily.   PRESERVISION AREDS 2 PO Take 1 tablet by mouth 2 (two) times daily.   rivastigmine 1.5 MG capsule Commonly known as: EXELON Take 1.5 mg by mouth 2 (two) times daily.   spironolactone 25 MG tablet Commonly known as: ALDACTONE Take 25 mg by mouth daily.   Super Omega 3 EPA/DHA 1000 MG Caps Take 1 capsule by mouth daily. Omega 3 - DHA - EPA - Fish oil-- 1000mg / 120mg / 180mg    tamsulosin 0.4 MG Caps capsule Commonly known as: FLOMAX Take 0.4 mg by mouth every evening.   traZODone 50 MG tablet Commonly known as: DESYREL Take 50 mg by mouth at bedtime.        Vitals:   06/16/23 0301 06/16/23 0831  BP: 135/69 126/70  Pulse: 73 84  Resp: 18 16  Temp: 97.9 F (36.6 C) 98.4 F (36.9 C)  SpO2: 96% 100%    Skin clean, dry and intact without evidence of skin break  down, no evidence of skin tears noted. IV catheter discontinued intact. Site without signs and symptoms of complications. Dressing and pressure applied. Pt denies pain at this time. No complaints noted.  An After Visit Summary was printed and given to the patient. Patient escorted via WC, and D/C to [facility] via private auto.  Chad Donoghue C. Jilda Roche

## 2023-06-16 NOTE — Telephone Encounter (Signed)
Discussed medications with patient with nursing staff.

## 2023-06-16 NOTE — Plan of Care (Signed)
Patient alert and oriented X 3 seems to sundown, and confirmed by daughter who visited during night shift. Patient ambulated to bathroom with front rolling walker and assist to bathroom. Patient wearing home brief in case of accidental urination or BM due to frequency and urgency. Patient tolerated full liquid diet without c/o of N/V/D. Possible discharge today 06/16/2023, with advance of diet. Problem: Education: Goal: Knowledge of General Education information will improve Description: Including pain rating scale, medication(s)/side effects and non-pharmacologic comfort measures Outcome: Progressing   Problem: Health Behavior/Discharge Planning: Goal: Ability to manage health-related needs will improve Outcome: Progressing   Problem: Clinical Measurements: Goal: Ability to maintain clinical measurements within normal limits will improve Outcome: Progressing Goal: Will remain free from infection Outcome: Progressing Goal: Diagnostic test results will improve Outcome: Progressing Goal: Respiratory complications will improve Outcome: Progressing Goal: Cardiovascular complication will be avoided Outcome: Progressing   Problem: Activity: Goal: Risk for activity intolerance will decrease Outcome: Progressing   Problem: Nutrition: Goal: Adequate nutrition will be maintained Outcome: Progressing   Problem: Coping: Goal: Level of anxiety will decrease Outcome: Progressing   Problem: Elimination: Goal: Will not experience complications related to bowel motility Outcome: Progressing Goal: Will not experience complications related to urinary retention Outcome: Progressing   Problem: Pain Management: Goal: General experience of comfort will improve Outcome: Progressing   Problem: Safety: Goal: Ability to remain free from injury will improve Outcome: Progressing   Problem: Skin Integrity: Goal: Risk for impaired skin integrity will decrease Outcome: Progressing

## 2023-06-19 ENCOUNTER — Non-Acute Institutional Stay (SKILLED_NURSING_FACILITY): Payer: Self-pay | Admitting: Adult Health

## 2023-06-19 ENCOUNTER — Encounter: Payer: Self-pay | Admitting: Adult Health

## 2023-06-19 DIAGNOSIS — K56609 Unspecified intestinal obstruction, unspecified as to partial versus complete obstruction: Secondary | ICD-10-CM

## 2023-06-19 DIAGNOSIS — N400 Enlarged prostate without lower urinary tract symptoms: Secondary | ICD-10-CM

## 2023-06-19 DIAGNOSIS — K219 Gastro-esophageal reflux disease without esophagitis: Secondary | ICD-10-CM | POA: Diagnosis not present

## 2023-06-19 DIAGNOSIS — I10 Essential (primary) hypertension: Secondary | ICD-10-CM

## 2023-06-19 DIAGNOSIS — D509 Iron deficiency anemia, unspecified: Secondary | ICD-10-CM

## 2023-06-19 NOTE — Progress Notes (Unsigned)
Location:  Other [facility]) Nursing Home Room Number: [place] [facility]) Place of Service:  SNF (31) Provider:  Kenard Gower, DNP, FNP-BC  Patient Care Team: Marisue Ivan, MD as PCP - General (Family Medicine)  Extended Emergency Contact Information Primary Emergency Contact: Biswurm,Suzanne Address: [address]          [place], [place] [address] [place] Home Phone: [phone] Relation: Daughter Secondary Emergency Contact: Bizwurm,Richard Address: [address]          [place], [place] [address] [place] Home Phone: [phone] Relation: Son  Code Status:  DNR  Goals of care: Advanced Directive information    06/19/2023    9:45 AM  Advanced Directives  Does Patient Have a Medical Advance Directive? Yes  Type of Advance Directive Out of facility DNR (pink MOST or yellow form)  Does patient want to make changes to medical advance directive? No - Patient declined  Pre-existing out of facility DNR order (yellow form or pink MOST form) Yellow form placed in chart (order not valid for inpatient use)     Chief Complaint  Patient presents with   Acute Visit     follow up hospitalization    HPI:  Pt is a 89 y.o. male seen today for medical management of chronic diseases.  ***   Past Medical History:  Diagnosis Date   Arthritis    Bilateral renal cysts    Bladder cancer [facility]    urologist-  dr Vernie Ammons   BPH (benign prostatic hyperplasia)    Coronary artery disease    hx PCI w/ stenting in 10/ 2014 in [place]--- recently moved from [place] jan 2018 has not established a cardiologist yet but does have pcp    Dyslipidemia    Essential hypertension    GERD (gastroesophageal reflux disease)    Hematuria    History of gastric ulcer    History of hiatal hernia    History of kidney stones    History of melanoma in situ    several Excision's in situ and malignant melanoma's--  last one 06/ 2017 top of ear  excision Stage 1 w/ negative margins/  other have been right upper arm, shoulder, chest, face   History of MI (myocardial infarction)    10/ 2014-- s/p  PCI and stenting x2   History of small bowel obstruction    fall 2017  s/p  abdominal lysis adhesions  (prior hx bowel obstruction total 8 times)   Nocturia    S/P right coronary artery (RCA) stent placement    10/ 2014  x2   Type 2 diabetes, diet controlled (HCC)    Wears glasses    Wears hearing aid    bilateral   Past Surgical History:  Procedure Laterality Date   ABDOMINAL HERNIA REPAIR  2012   ADDOMINAL LYSIS ADHESIONS  fall 2017   sbo   CHOLECYSTECTOMY OPEN  1971   and Appendectomy   CORONARY ANGIOPLASTY WITH STENT PLACEMENT  04-17-2013  in [place]   x2 stents to RCA   INGUINAL HERNIA REPAIR Bilateral 1995   TOTAL HIP ARTHROPLASTY Left 2008   TRANSURETHRAL RESECTION OF BLADDER TUMOR  09/2006   LEIOMYOMA    TRANSURETHRAL RESECTION OF BLADDER TUMOR WITH MITOMYCIN-C N/A 09/02/2016   Procedure: TRANSURETHRAL RESECTION OF BLADDER TUMOR WITH MITOMYCIN-C;  Surgeon: Ihor Gully, MD;  Location: [facility];  Service: Urology;  Laterality: N/A;    Allergies  Allergen  Reactions   Fenofibrate     Other reaction(s): Muscle Pain   Losartan Other (See Comments)    Caused weakness, chest pressure, and shortness of breath    Outpatient Encounter Medications as of 06/19/2023  Medication Sig   acetaminophen (TYLENOL) 325 MG tablet Take 650 mg by mouth every 4 (four) hours as needed.   Ascorbic Acid 500 MG CAPS Take 500 mg by mouth daily.   aspirin EC 81 MG tablet Take 81 mg by mouth daily.   cetirizine (ZYRTEC) 10 MG tablet Take 10 mg by mouth daily as needed for allergies.   Cholecalciferol 50 MCG (2000 UT) TABS Take 1 tablet by mouth daily.   Cinnamon 500 MG capsule Take 1,000 mg by mouth in the morning and at bedtime.   Coenzyme Q10 100 MG capsule Take 100 mg by mouth daily.   cyanocobalamin 1000 MCG tablet Take  1 tablet (1,000 mcg total) by mouth daily.   finasteride (PROSCAR) 5 MG tablet Take 5 mg by mouth every evening.    iron polysaccharides (NIFEREX) 150 MG capsule Take 1 capsule (150 mg total) by mouth daily.   melatonin (MELATONIN MAXIMUM STRENGTH) 5 MG TABS Take 5 mg by mouth at bedtime as needed.   metoprolol succinate (TOPROL-XL) 50 MG 24 hr tablet Take 1 tablet (50 mg total) by mouth daily.   Multiple Vitamin (MULTIVITAMIN WITH MINERALS) TABS tablet Take 1 tablet by mouth daily.   Multiple Vitamins-Minerals (PRESERVISION AREDS 2 PO) Take 1 tablet by mouth 2 (two) times daily.   Omega-3 Fatty Acids (SUPER OMEGA 3 EPA/DHA) 1000 MG CAPS Take 1 capsule by mouth daily. Omega 3 - DHA - EPA - Fish oil-- 1000mg / 120mg / 180mg    ondansetron (ZOFRAN) 4 MG tablet Take 4 mg by mouth daily as needed for nausea or vomiting.   pantoprazole (PROTONIX) 40 MG tablet Take 1 tablet (40 mg total) by mouth daily.   polyethylene glycol powder (GLYCOLAX/MIRALAX) 17 GM/SCOOP powder Take by mouth daily as needed.   potassium chloride SA (KLOR-CON M) 20 MEQ tablet Take 20 mEq by mouth daily.   rivastigmine (EXELON) 1.5 MG capsule Take 1.5 mg by mouth 2 (two) times daily.   spironolactone (ALDACTONE) 25 MG tablet Take 25 mg by mouth daily.   tamsulosin (FLOMAX) 0.4 MG CAPS capsule Take 0.4 mg by mouth every evening.    traZODone (DESYREL) 50 MG tablet Take 50 mg by mouth at bedtime.   No facility-administered encounter medications on file as of 06/19/2023.    Review of Systems ***    Immunization History  Administered Date(s) Administered   Fluzone Influenza virus vaccine,trivalent (IIV3), split virus 03/27/2013, 03/27/2014   H1N1 07/08/2008   Influenza, High Dose Seasonal PF 04/13/2021, 04/04/2022   Influenza-Unspecified 03/27/2000, 03/27/2001, 04/27/2001, 03/27/2002, 03/28/2003, 03/27/2004, 03/27/2005, 04/27/2006, 04/19/2007, 04/09/2008, 03/16/2009, 04/07/2010, 04/12/2011   Moderna Sars-Covid-2 Vaccination  07/17/2019, 08/14/2019, 09/11/2019, 06/30/2020   PPD Test 06/02/2020   Pneumococcal Conjugate,unspecified 06/27/2013   Pneumococcal-Unspecified 04/28/1999, 03/27/2001, 04/11/2011   Zoster, Live 03/27/2012   Pertinent  Health Maintenance Due  Topic Date Due   HEMOGLOBIN A1C  Never done   OPHTHALMOLOGY EXAM  Never done   FOOT EXAM  10/29/2019   INFLUENZA VACCINE  01/26/2023      07/03/2020    1:13 AM 07/03/2020    9:00 AM 07/03/2020    9:05 PM 01/14/2021    9:22 PM 07/22/2021    4:39 PM  Fall Risk  (RETIRED) Patient Fall Risk Level High fall risk  High fall risk High fall risk Low fall risk Moderate fall risk     Vitals:   06/19/23 0939  BP: 109/67  Pulse: 63  Resp: 16  Temp: 98 F (36.7 C)  SpO2: 95%  Weight: 200 lb (90.7 kg)  Height: 5\' 4"  (1.626 m)   Body mass index is 34.33 kg/m.  Physical Exam     Labs reviewed: Recent Labs    12/28/22 0951 12/29/22 0641 12/30/22 0445 06/11/23 2202 06/14/23 0441 06/15/23 0626 06/16/23 0532  NA 131* 130* 133*   < > 130* 132* 135  K 3.8 3.5 3.7   < > 3.2* 3.1* 3.7  CL 101 100 100   < > 94* 96* 99  CO2 23 23 25    < > 26 24 28   GLUCOSE 158* 124* 119*   < > 161* 163* 122*  BUN 14 11 10    < > 12 13 15   CREATININE 0.72 0.80 0.81   < > 0.74 0.98 0.72  CALCIUM 7.9* 7.9* 8.2*   < > 8.3* 8.5* 8.3*  MG 2.1 1.9 1.9  --   --  1.8  --   PHOS 2.4* 2.5 2.8  --   --   --   --    < > = values in this interval not displayed.   Recent Labs    12/26/22 0200 06/11/23 2202  AST 18 19  ALT 13 13  ALKPHOS 63 76  BILITOT 0.4 0.9  PROT 6.7 7.3  ALBUMIN 3.3* 3.8   Recent Labs    12/26/22 0200 12/27/22 0414 06/14/23 0441 06/15/23 0626 06/16/23 0532  WBC 11.4*   < > 11.5* 11.8* 7.7  NEUTROABS 7.9*  --   --   --   --   HGB 11.4*   < > 12.8* 13.6 11.6*  HCT 36.3*   < > 36.8* 40.0 34.0*  MCV 78.9*   < > 88.2 89.3 88.8  PLT 337   < > 289 263 258   < > = values in this interval not displayed.   Lab Results  Component Value Date    TSH 1.567 10/07/2016   No results found for: "HGBA1C" No results found for: "CHOL", "HDL", "LDLCALC", "LDLDIRECT", "TRIG", "CHOLHDL"  Significant Diagnostic Results in last 30 days:  DG Abd Portable 1V-Small Bowel Obstruction Protocol-initial, 8 hr delay Result Date: 06/15/2023 CLINICAL DATA:  8 hour delay for small bowel obstruction. EXAM: PORTABLE ABDOMEN - 1 VIEW COMPARISON:  Abdominal series 06/14/2023. FINDINGS: Contrast is seen throughout nondilated colon to the level the rectum. Dilated air-filled small bowel loops persist measuring up to 4 cm. Enteric tube tip is in the body of the stomach, unchanged. Surgical coils overlie the abdomen, unchanged. Left hip arthroplasty is present. IMPRESSION: 1. Contrast is seen throughout nondilated colon to the level the rectum. 2. Dilated air-filled small bowel loops persist measuring up to 4 cm may represent ileus or partial obstruction. Electronically Signed   By: Darliss Cheney M.D.   On: 06/15/2023 01:20   DG Chest Port 1 View Result Date: 06/14/2023 CLINICAL DATA:  Cough EXAM: PORTABLE CHEST 1 VIEW COMPARISON:  06/14/2023, 04/22/2021 FINDINGS: Enteric tube tip below the diaphragm but incompletely visualized. Small loculated right pleural effusion without significant change. Minimal atelectasis left base. Stable cardiomediastinal silhouette. No pneumothorax IMPRESSION: Small loculated right pleural effusion without significant change. Minimal atelectasis left base. Electronically Signed   By: Jasmine Pang M.D.   On: 06/14/2023 18:44   DG ABD ACUTE  2+V W 1V CHEST Result Date: 06/14/2023 CLINICAL DATA:  Small-bowel obstruction EXAM: DG ABDOMEN ACUTE WITH 1 VIEW CHEST COMPARISON:  06/13/2023 FINDINGS: Supine and upright frontal views of the abdomen and pelvis as well as an upright frontal view of the chest are obtained. Cardiac silhouette is unremarkable. Small partially loculated right pleural effusion unchanged. No airspace disease or pneumothorax.  Enteric catheter passes below diaphragm, tip projecting over the gastric body. Mildly distended gas-filled loops of small bowel within the central abdomen measuring up to 4 cm. Stable gas and stool throughout the colon. No masses or abnormal calcifications. No free gas in the greater peritoneal sac. IMPRESSION: 1. Stable gaseous distention of the small bowel within the central abdomen, compatible with small-bowel obstruction. 2. Enteric catheter passing below diaphragm, tip and side port projecting over the region of the gastric body. 3. Small right pleural effusion unchanged. Electronically Signed   By: Sharlet Salina M.D.   On: 06/14/2023 10:38   DG Abd 2 Views Result Date: 06/13/2023 CLINICAL DATA:  Small-bowel obstruction EXAM: ABDOMEN - 2 VIEW COMPARISON:  06/12/2023 FINDINGS: Supine and upright frontal views of the abdomen and pelvis are obtained. Enteric catheter tip and side port project over the distal thoracic esophagus likely within the known small hiatal hernia, recommend advancing at least 4 cm to ensure side port placement within the subdiaphragmatic stomach. Distended gas-filled loops of small bowel are seen primarily within the left mid abdomen, measuring up to 4 cm in diameter. Minimal gas and stool again seen throughout the colon. No free gas in the greater peritoneal sac. Trace right pleural effusion again noted. Otherwise the lung bases are clear. IMPRESSION: 1. Stable gaseous distension of the proximal small bowel consistent with small-bowel obstruction. 2. Enteric catheter tip projecting over the distal thoracic esophagus in the region of the known small hiatal hernia. Recommend advancing 4 cm to ensure side port placement within the gastric lumen. 3. Small right pleural effusion. Electronically Signed   By: Sharlet Salina M.D.   On: 06/13/2023 09:45   DG Abdomen 1 View Result Date: 06/12/2023 CLINICAL DATA:  Confirm NG tube placement EXAM: ABDOMEN - 1 VIEW COMPARISON:  Radiograph  06/12/2023 at 6:23 a.m. FINDINGS: Underpenetrated radiograph. NG tube tip projects over the expected location of the stomach. The side-port is not confidently visualized. IMPRESSION: NG tube tip projects over the expected location of the stomach. Side port is not definitively visualized. Electronically Signed   By: Minerva Fester M.D.   On: 06/12/2023 20:04   DG Abdomen 1 View Result Date: 06/12/2023 CLINICAL DATA:  NG tube placement EXAM: ABDOMEN - 1 VIEW COMPARISON:  06/11/2023 FINDINGS: Interval placement of enteric tube with tip and side port below the level of the GE junction. Mild gaseous distension of the stomach. Persistent mild small bowel dilatation within the left hemiabdomen. Fasteners for ventral abdominal wall hernia mesh are again noted. Multilevel thoracolumbar spondylosis. IMPRESSION: 1. Interval placement of enteric tube with tip and side port below the level of the GE junction. 2. Persistent mild small bowel dilatation within the left hemiabdomen. Electronically Signed   By: Signa Kell M.D.   On: 06/12/2023 06:34   CT ABDOMEN PELVIS W CONTRAST Result Date: 06/11/2023 CLINICAL DATA:  Abdominal pain and distention EXAM: CT ABDOMEN AND PELVIS WITH CONTRAST TECHNIQUE: Multidetector CT imaging of the abdomen and pelvis was performed using the standard protocol following bolus administration of intravenous contrast. RADIATION DOSE REDUCTION: This exam was performed according to the departmental dose-optimization program  which includes automated exposure control, adjustment of the mA and/or kV according to patient size and/or use of iterative reconstruction technique. CONTRAST:  OMNIPAQUE IOHEXOL 300 MG/ML  SOLN COMPARISON:  CT abdomen and pelvis 12/26/2022 FINDINGS: Lower chest: Unchanged distention of the distal esophagus with small hiatal hernia and fluid refluxed into the esophagus. Unchanged small loculated right pleural effusion and associated airspace opacities. Bronchial wall  thickening and mucous plugging in the right lower lobe. Hepatobiliary: No acute abnormality. Cholecystectomy. No biliary dilation. Pancreas: Fatty atrophy.  No acute abnormality. Spleen: Unremarkable. Adrenals/Urinary Tract: Stable adrenal glands and kidneys. No urinary calculi or hydronephrosis. Unremarkable bladder. Stomach/bowel: Mild dilation of loops of small bowel in the left hemiabdomen measuring up to 3.3 cm in diameter. There is smooth tapering to the ileum which is decompressed. No discrete transition point is identified. Normal caliber colon without wall thickening. Extensive colonic diverticulosis. No diverticulitis. The appendix is not definitively visualized. Vascular/Lymphatic: Aortic atherosclerosis. No enlarged abdominal or pelvic lymph nodes. Reproductive: Enlarged prostate. Other: Mesenteric edema and trace fluid about the dilated loops of small bowel. No free intraperitoneal air. Postoperative change about the ventral abdominal wall. Musculoskeletal: Left THA.  No acute fracture. IMPRESSION: 1. Mild dilation of loops of small bowel in the left hemiabdomen measuring up to 3.3 cm in diameter. There is smooth tapering to the ileum which is decompressed. No discrete transition point is identified. Findings may represent ileus though early or partial small bowel obstruction is difficult to exclude. 2. Unchanged small loculated right pleural effusion and associated airspace opacities. Bronchial wall thickening and mucous plugging in the right lower lobe. 3. Unchanged distention of the distal esophagus with small hiatal hernia and fluid refluxed into the esophagus. Aortic Atherosclerosis (ICD10-I70.0). Electronically Signed   By: Minerva Fester M.D.   On: 06/11/2023 23:20    Assessment/Plan ***   Family/ staff Communication: Discussed plan of care with resident and charge nurse  Labs/tests ordered:     Kenard Gower, DNP, MSN, FNP-BC [facility] [phone] (Monday-Friday 8:00 a.m. - 5:00 p.m.) [phone] (after hours)

## 2023-06-26 LAB — BASIC METABOLIC PANEL
BUN: 16 (ref 4–21)
CO2: 29 — AB (ref 13–22)
Chloride: 97 — AB (ref 99–108)
Creatinine: 0.8 (ref 0.6–1.3)
Glucose: 125
Potassium: 4.2 meq/L (ref 3.5–5.1)
Sodium: 134 — AB (ref 137–147)

## 2023-06-26 LAB — CBC AND DIFFERENTIAL
HCT: 39 — AB (ref 41–53)
Hemoglobin: 13 — AB (ref 13.5–17.5)
Neutrophils Absolute: 5540
Platelets: 352 10*3/uL (ref 150–400)
WBC: 8.1

## 2023-06-26 LAB — CBC: RBC: 4.3 (ref 3.87–5.11)

## 2023-06-26 LAB — COMPREHENSIVE METABOLIC PANEL
Calcium: 8.8 (ref 8.7–10.7)
eGFR: 79

## 2023-06-30 ENCOUNTER — Encounter: Payer: Self-pay | Admitting: Student

## 2023-06-30 ENCOUNTER — Non-Acute Institutional Stay (SKILLED_NURSING_FACILITY): Payer: Self-pay | Admitting: Student

## 2023-06-30 DIAGNOSIS — K56609 Unspecified intestinal obstruction, unspecified as to partial versus complete obstruction: Secondary | ICD-10-CM

## 2023-06-30 DIAGNOSIS — I440 Atrioventricular block, first degree: Secondary | ICD-10-CM

## 2023-06-30 DIAGNOSIS — E119 Type 2 diabetes mellitus without complications: Secondary | ICD-10-CM | POA: Diagnosis not present

## 2023-06-30 DIAGNOSIS — N400 Enlarged prostate without lower urinary tract symptoms: Secondary | ICD-10-CM

## 2023-06-30 DIAGNOSIS — I10 Essential (primary) hypertension: Secondary | ICD-10-CM

## 2023-06-30 DIAGNOSIS — K219 Gastro-esophageal reflux disease without esophagitis: Secondary | ICD-10-CM

## 2023-06-30 DIAGNOSIS — D509 Iron deficiency anemia, unspecified: Secondary | ICD-10-CM

## 2023-06-30 DIAGNOSIS — I2510 Atherosclerotic heart disease of native coronary artery without angina pectoris: Secondary | ICD-10-CM

## 2023-06-30 NOTE — Progress Notes (Signed)
 Provider:   Dr. Richerd Brigham Location:  Other [place].  Nursing Home Room Number: [facility] 108A Place of Service:  SNF (31)  PCP: Alla Amis, MD Patient Care Team: Alla Amis, MD as PCP - General [facility])  Extended Emergency Contact Information Primary Emergency Contact: Biswurm,Suzanne Address: [address]          [place], [place] [address] [place] Home Phone: [phone] Relation: Daughter Secondary Emergency Contact: Bizwurm,Richard Address: [address]          [place], [place] [address] [place] Home Phone: [phone] Relation: Son  Code Status: DNR Goals of Care: Advanced Directive information    06/30/2023   10:49 AM  Advanced Directives  Does Patient Have a Medical Advance Directive? Yes  Type of Advance Directive Out of facility DNR (pink MOST or yellow form)  Does patient want to make changes to medical advance directive? No - Patient declined      Chief Complaint  Patient presents with   New Admit To SNF    Admission.     HPI: Patient is a 89 y.o. male seen today for admission to [facility].   He has had obstruction - just before the ileocecal valve. He has had it 5x. He has a  lot of adhesions in his abdomen. He has laparascopic surgery once - it's obvious on   Hx of ventral hernia - he had gall bladder issues which led to it. He had mesh put in and that caused more problems.   They are concerned if he does the provedure to lyse adhesions that he won't survive.   He has a DNR on everything. He has an Aortic aneurysm.   Right pinky, he fell and dislocated it and it coulnd' tbe reduced. 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 He was in [place] . He stopped driving at 89 yo. He was falling  until that point. He was having some hallucinations and he was told he has Lewy Body Dementia. His brain MRI didn't have a lot of  He had an EEG   He moved to [facility] 3-4 years ago. They were going to stay in a house. His wife had dementia and fell broke her hip. She didn't do well with surgery and died shortly after. She was in hospice in the old [facility]. He has a son and a daughter. His grandson manages his finances. His daughter is 10 minutes away.   He doesn't get help in Assisted living. This is the first time he had someone help him get a bath. Prior to SBO, he was ambulatory and independent in ADLS. The last few days he has been checked for balance. His goal is to get back to assisted living. He doesn't recall having any falls in the last year.   Hx of lef thip replacement 30+ years ago. No pain. Right hip has arthritis pain.    Until this year he has gone to [place]  for 2 weeks every summer.   Past Medical History:  Diagnosis Date   Arthritis    Bilateral renal cysts    Bladder cancer [facility])    urologist-  dr ceil   BPH (benign prostatic hyperplasia)  Coronary artery disease    hx PCI w/ stenting in 10/ 2014 in [place] --- recently moved from [place]  jan 2018 has not established a cardiologist yet but does have pcp    Dyslipidemia    Essential hypertension    GERD (gastroesophageal reflux disease)    Hematuria    History of gastric ulcer    History of hiatal hernia    History of kidney stones    History of melanoma in situ    several Excision's in situ and malignant melanoma's--  last one 06/ 2017 top of ear excision Stage 1 w/ negative margins/  other have been right upper arm, shoulder, chest, face   History of MI (myocardial infarction)    10/ 2014-- s/p  PCI and stenting x2   History of small bowel obstruction    fall 2017  s/p  abdominal lysis adhesions  (prior hx bowel obstruction total 8 times)   Nocturia    S/P right coronary artery (RCA)  stent placement    10/ 2014  x2   Type 2 diabetes, diet controlled (HCC)    Wears glasses    Wears hearing aid    bilateral   Past Surgical History:  Procedure Laterality Date   ABDOMINAL HERNIA REPAIR  2012   ADDOMINAL LYSIS ADHESIONS  fall 2017   sbo   CHOLECYSTECTOMY OPEN  1971   and Appendectomy   CORONARY ANGIOPLASTY WITH STENT PLACEMENT  04-17-2013  in [place]    x2 stents to RCA   INGUINAL HERNIA REPAIR Bilateral 1995   TOTAL HIP ARTHROPLASTY Left 2008   TRANSURETHRAL RESECTION OF BLADDER TUMOR  09/2006   LEIOMYOMA    TRANSURETHRAL RESECTION OF BLADDER TUMOR WITH MITOMYCIN -C N/A 09/02/2016   Procedure: TRANSURETHRAL RESECTION OF BLADDER TUMOR WITH MITOMYCIN -C;  Surgeon: Mark Ottelin, MD;  Location: [facility] [place];  Service: Urology;  Laterality: N/A;    reports that he has never smoked. He has never used smokeless tobacco. He reports current alcohol use of about 1.0 standard drink of alcohol per week. He reports that he does not use drugs. Social History   Socioeconomic History   Marital status: Married    Spouse name: Not on file   Number of children: Not on file   Years of education: Not on file   Highest education level: Not on file  Occupational History   Occupation: retired physician  Tobacco Use   Smoking status: Never   Smokeless tobacco: Never  Vaping Use   Vaping status: Never Used  Substance and Sexual Activity   Alcohol use: Yes    Alcohol/week: 1.0 standard drink of alcohol    Types: 1 Glasses of wine per week    Comment: occasional   Drug use: No   Sexual activity: Not Currently  Other Topics Concern   Not on file  Social History Narrative   Not on file   Social Drivers of Health   Financial Resource Strain: Low Risk  (04/07/2023)   Received from [facility]   Overall Financial Resource Strain (CARDIA)    Difficulty of Paying Living Expenses: Not hard at all  Food Insecurity: No Food Insecurity (06/12/2023)    Hunger Vital Sign    Worried About Running Out of Food in the Last Year: Never true    Ran Out of Food in the Last Year: Never true  Transportation Needs: No Transportation Needs (06/12/2023)   PRAPARE - Administrator, Civil Service (Medical):  No    Lack of Transportation (Non-Medical): No  Physical Activity: Not on file  Stress: Not on file  Social Connections: Not on file  Intimate Partner Violence: Not At Risk (06/12/2023)   Humiliation, Afraid, Rape, and Kick questionnaire    Fear of Current or Ex-Partner: No    Emotionally Abused: No    Physically Abused: No    Sexually Abused: No    Functional Status Survey:    Family History  Problem Relation Age of Onset   Cancer Mother    CAD Father    CAD Sister    CAD Sister    CAD Sister     Health Maintenance  Topic Date Due   HEMOGLOBIN A1C  Never done   Pneumonia Vaccine 17+ Years old (1 of 2 - PCV) 03/24/1927   OPHTHALMOLOGY EXAM  Never done   DTaP/Tdap/Td (1 - Tdap) Never done   Zoster Vaccines- Shingrix (1 of 2) 03/24/1940   FOOT EXAM  10/29/2019   Medicare Annual Wellness (AWV)  06/02/2020   INFLUENZA VACCINE  01/26/2023   COVID-19 Vaccine (5 - 2024-25 season) 02/26/2023   HPV VACCINES  Aged Out    Allergies  Allergen Reactions   Fenofibrate     Other reaction(s): Muscle Pain   Losartan  Other (See Comments)    Caused weakness, chest pressure, and shortness of breath    Outpatient Encounter Medications as of 06/30/2023  Medication Sig   acetaminophen  (TYLENOL ) 325 MG tablet Take 650 mg by mouth every 4 (four) hours as needed.   Ascorbic Acid 500 MG CAPS Take 500 mg by mouth daily.   aspirin  EC 81 MG tablet Take 81 mg by mouth daily.   cetirizine (ZYRTEC) 10 MG tablet Take 10 mg by mouth daily as needed for allergies.   Cholecalciferol 50 MCG (2000 UT) TABS Take 1 tablet by mouth daily.   Cinnamon  500 MG capsule Take 1,000 mg by mouth in the morning and at bedtime.   Coenzyme Q10 100 MG capsule  Take 100 mg by mouth daily.   cyanocobalamin  1000 MCG tablet Take 1 tablet (1,000 mcg total) by mouth daily.   finasteride  (PROSCAR ) 5 MG tablet Take 5 mg by mouth every evening.    furosemide (LASIX) 40 MG tablet Take 40 mg by mouth. Every 24 hours as needed for weight gain of 3lbx overnight or 5lbs in week take with potassium   guaiFENesin (ROBITUSSIN) 100 MG/5ML liquid Take 10 mLs by mouth every 4 (four) hours as needed for cough or to loosen phlegm.   melatonin (MELATONIN MAXIMUM STRENGTH) 5 MG TABS Take 5 mg by mouth at bedtime as needed.   metoprolol  succinate (TOPROL -XL) 50 MG 24 hr tablet Take 1 tablet (50 mg total) by mouth daily.   Multiple Vitamin (MULTIVITAMIN WITH MINERALS) TABS tablet Take 1 tablet by mouth daily.   Multiple Vitamins-Minerals (PRESERVISION AREDS 2 PO) Take 1 tablet by mouth 2 (two) times daily.   Omega-3 Fatty Acids (SUPER OMEGA 3 EPA/DHA) 1000 MG CAPS Take 1 capsule by mouth daily. Omega 3 - DHA - EPA - Fish oil-- 1000mg / 120mg / 180mg    pantoprazole  (PROTONIX ) 40 MG tablet Take 1 tablet (40 mg total) by mouth daily.   polyethylene glycol powder (GLYCOLAX/MIRALAX) 17 GM/SCOOP powder Take by mouth daily as needed.   potassium chloride  SA (KLOR-CON  M) 20 MEQ tablet Take 20 mEq by mouth daily.   rivastigmine  (EXELON ) 1.5 MG capsule Take 1.5 mg by mouth 2 (two) times daily.  spironolactone  (ALDACTONE ) 25 MG tablet Take 25 mg by mouth daily.   tamsulosin  (FLOMAX ) 0.4 MG CAPS capsule Take 0.4 mg by mouth every evening.    traZODone  (DESYREL ) 50 MG tablet Take 50 mg by mouth at bedtime.   Zinc Oxide (TRIPLE PASTE) 12.8 % ointment Apply 1 Application topically. Every shift for redness.   ondansetron  (ZOFRAN ) 4 MG tablet Take 4 mg by mouth daily as needed for nausea or vomiting. (Patient not taking: Reported on 06/30/2023)   No facility-administered encounter medications on file as of 06/30/2023.    Review of Systems  Vitals:   06/30/23 1040 06/30/23 1052  BP: (!) 167/79  (!) 160/72  Pulse: 69   Resp: 16   Temp: (!) 97.5 F (36.4 C)   SpO2: 94%   Weight: 193 lb 12.8 oz (87.9 kg)   Height: 5' 4 (1.626 m)    Body mass index is 33.27 kg/m. Physical Exam Constitutional:      Appearance: Normal appearance.  Cardiovascular:     Rate and Rhythm: Normal rate.     Pulses: Normal pulses.     Heart sounds: Normal heart sounds.  Pulmonary:     Effort: Pulmonary effort is normal.  Abdominal:     General: Abdomen is flat. Bowel sounds are normal.     Palpations: Abdomen is soft.     Comments: Ventral hernia present  Musculoskeletal:        General: No swelling or tenderness.  Skin:    General: Skin is warm and dry.  Neurological:     Mental Status: He is alert and oriented to person, place, and time.     Gait: Gait normal.  Psychiatric:        Mood and Affect: Mood normal.     Labs reviewed: Basic Metabolic Panel: Recent Labs    12/28/22 0951 12/29/22 0641 12/30/22 0445 06/11/23 2202 06/14/23 0441 06/15/23 0626 06/16/23 0532 06/26/23 0000  NA 131* 130* 133*   < > 130* 132* 135 134*  K 3.8 3.5 3.7   < > 3.2* 3.1* 3.7 4.2  CL 101 100 100   < > 94* 96* 99 97*  CO2 23 23 25    < > 26 24 28  29*  GLUCOSE 158* 124* 119*   < > 161* 163* 122*  --   BUN 14 11 10    < > 12 13 15 16   CREATININE 0.72 0.80 0.81   < > 0.74 0.98 0.72 0.8  CALCIUM  7.9* 7.9* 8.2*   < > 8.3* 8.5* 8.3* 8.8  MG 2.1 1.9 1.9  --   --  1.8  --   --   PHOS 2.4* 2.5 2.8  --   --   --   --   --    < > = values in this interval not displayed.   Liver Function Tests: Recent Labs    12/26/22 0200 06/11/23 2202  AST 18 19  ALT 13 13  ALKPHOS 63 76  BILITOT 0.4 0.9  PROT 6.7 7.3  ALBUMIN 3.3* 3.8   Recent Labs    12/26/22 0200 06/11/23 2202  LIPASE 27 26   No results for input(s): AMMONIA in the last 8760 hours. CBC: Recent Labs    12/26/22 0200 12/27/22 0414 06/14/23 0441 06/15/23 0626 06/16/23 0532 06/26/23 0000  WBC 11.4*   < > 11.5* 11.8* 7.7 8.1   NEUTROABS 7.9*  --   --   --   --  5,540.00  HGB  11.4*   < > 12.8* 13.6 11.6* 13.0*  HCT 36.3*   < > 36.8* 40.0 34.0* 39*  MCV 78.9*   < > 88.2 89.3 88.8  --   PLT 337   < > 289 263 258 352   < > = values in this interval not displayed.   Cardiac Enzymes: No results for input(s): CKTOTAL, CKMB, CKMBINDEX, TROPONINI in the last 8760 hours. BNP: Invalid input(s): POCBNP No results found for: HGBA1C Lab Results  Component Value Date   TSH 1.567 10/07/2016   Lab Results  Component Value Date   VITAMINB12 242 12/28/2022   Lab Results  Component Value Date   FOLATE 33.0 12/28/2022   Lab Results  Component Value Date   IRON  33 (L) 12/28/2022   TIBC 344 12/28/2022    Imaging and Procedures obtained prior to SNF admission: DG Abd 2 Views Result Date: 06/13/2023 CLINICAL DATA:  Small-bowel obstruction EXAM: ABDOMEN - 2 VIEW COMPARISON:  06/12/2023 FINDINGS: Supine and upright frontal views of the abdomen and pelvis are obtained. Enteric catheter tip and side port project over the distal thoracic esophagus likely within the known small hiatal hernia, recommend advancing at least 4 cm to ensure side port placement within the subdiaphragmatic stomach. Distended gas-filled loops of small bowel are seen primarily within the left mid abdomen, measuring up to 4 cm in diameter. Minimal gas and stool again seen throughout the colon. No free gas in the greater peritoneal sac. Trace right pleural effusion again noted. Otherwise the lung bases are clear. IMPRESSION: 1. Stable gaseous distension of the proximal small bowel consistent with small-bowel obstruction. 2. Enteric catheter tip projecting over the distal thoracic esophagus in the region of the known small hiatal hernia. Recommend advancing 4 cm to ensure side port placement within the gastric lumen. 3. Small right pleural effusion. Electronically Signed   By: Ozell Daring M.D.   On: 06/13/2023 09:45   DG Abdomen 1 View Result  Date: 06/12/2023 CLINICAL DATA:  Confirm NG tube placement EXAM: ABDOMEN - 1 VIEW COMPARISON:  Radiograph 06/12/2023 at 6:23 a.m. FINDINGS: Underpenetrated radiograph. NG tube tip projects over the expected location of the stomach. The side-port is not confidently visualized. IMPRESSION: NG tube tip projects over the expected location of the stomach. Side port is not definitively visualized. Electronically Signed   By: Norman Gatlin M.D.   On: 06/12/2023 20:04   DG Abdomen 1 View Result Date: 06/12/2023 CLINICAL DATA:  NG tube placement EXAM: ABDOMEN - 1 VIEW COMPARISON:  06/11/2023 FINDINGS: Interval placement of enteric tube with tip and side port below the level of the GE junction. Mild gaseous distension of the stomach. Persistent mild small bowel dilatation within the left hemiabdomen. Fasteners for ventral abdominal wall hernia mesh are again noted. Multilevel thoracolumbar spondylosis. IMPRESSION: 1. Interval placement of enteric tube with tip and side port below the level of the GE junction. 2. Persistent mild small bowel dilatation within the left hemiabdomen. Electronically Signed   By: Waddell Calk M.D.   On: 06/12/2023 06:34    Assessment/Plan Small bowel obstruction (HCC)  Diabetes mellitus type 2, diet-controlled (HCC), Chronic  1st degree AV block  Essential hypertension  Iron  deficiency anemia, unspecified iron  deficiency anemia type  Coronary artery disease involving native coronary artery of native heart without angina pectoris  GERD without esophagitis  Benign prostatic hyperplasia, unspecified whether lower urinary tract symptoms present Patient with recent hospitalization for SBO. Nonoperative management given his age and risk with surgical  interventions. BM normal at this time. DM diet controlled. Denies chest pain at this time. BP slightly elevated today, however, typically well-controlled, continue spironolactone . Continue proscar  for BPH symptoms. Continue ASA for  hx of CAD. Continue rivastigmine  for lewy body dementia.   Family/ staff Communication: Nursing  Labs/tests ordered: None   I spent greater than 50  minutes for the care of this patient in face to face time, chart review, clinical documentation, patient education. I spent an additional 16 minutes discussing goals of care and advanced care planning.

## 2023-07-01 ENCOUNTER — Encounter: Payer: Self-pay | Admitting: Student

## 2023-07-01 DIAGNOSIS — M8580 Other specified disorders of bone density and structure, unspecified site: Secondary | ICD-10-CM | POA: Insufficient documentation

## 2023-07-01 DIAGNOSIS — R1319 Other dysphagia: Secondary | ICD-10-CM | POA: Insufficient documentation

## 2023-07-01 DIAGNOSIS — K2270 Barrett's esophagus without dysplasia: Secondary | ICD-10-CM | POA: Insufficient documentation

## 2023-07-01 DIAGNOSIS — K449 Diaphragmatic hernia without obstruction or gangrene: Secondary | ICD-10-CM | POA: Insufficient documentation

## 2023-07-01 DIAGNOSIS — E669 Obesity, unspecified: Secondary | ICD-10-CM | POA: Insufficient documentation

## 2023-07-01 DIAGNOSIS — K660 Peritoneal adhesions (postprocedural) (postinfection): Secondary | ICD-10-CM | POA: Insufficient documentation

## 2023-07-04 ENCOUNTER — Encounter: Payer: Self-pay | Admitting: Nurse Practitioner

## 2023-07-04 ENCOUNTER — Non-Acute Institutional Stay (SKILLED_NURSING_FACILITY): Payer: Self-pay | Admitting: Nurse Practitioner

## 2023-07-04 DIAGNOSIS — E119 Type 2 diabetes mellitus without complications: Secondary | ICD-10-CM

## 2023-07-04 DIAGNOSIS — K219 Gastro-esophageal reflux disease without esophagitis: Secondary | ICD-10-CM

## 2023-07-04 DIAGNOSIS — I10 Essential (primary) hypertension: Secondary | ICD-10-CM | POA: Diagnosis not present

## 2023-07-04 DIAGNOSIS — I2510 Atherosclerotic heart disease of native coronary artery without angina pectoris: Secondary | ICD-10-CM

## 2023-07-04 DIAGNOSIS — R197 Diarrhea, unspecified: Secondary | ICD-10-CM

## 2023-07-04 DIAGNOSIS — K56609 Unspecified intestinal obstruction, unspecified as to partial versus complete obstruction: Secondary | ICD-10-CM | POA: Diagnosis not present

## 2023-07-04 DIAGNOSIS — D509 Iron deficiency anemia, unspecified: Secondary | ICD-10-CM

## 2023-07-04 DIAGNOSIS — N400 Enlarged prostate without lower urinary tract symptoms: Secondary | ICD-10-CM

## 2023-07-04 NOTE — Progress Notes (Signed)
 Location:  Other [facility].  Nursing Home Room Number: [facility] 108A Place of Service:  SNF ([phone]) Harlene An, NP  PCP: Alla Amis, MD  Patient Care Team: Alla Amis, MD as PCP - General [facility])  Extended Emergency Contact Information Primary Emergency Contact: Biswurm,Suzanne Address: [address]          [place], [place] [address] [place] Home Phone: [phone] Relation: Daughter Secondary Emergency Contact: Bizwurm,Richard Address: [address]          [place], [place] [address] [place] Home Phone: [phone] Relation: Son  Goals of care: Advanced Directive information    07/04/2023   11:30 AM  Advanced Directives  Does Patient Have a Medical Advance Directive? Yes  Type of Advance Directive Out of facility DNR (pink MOST or yellow form)  Does patient want to make changes to medical advance directive? No - Patient declined     Chief Complaint  Patient presents with   Discharge Note    Discharge    HPI:  Pt is a 89 y.o. male seen today for Discharge. Pt with hx of CAD, DM, HTN, recurrent SBO who was hospitalized with SBO. He lives at [facility] assisted [facility] and was feeling poorly with abdominal distention and discomfort therefore went to the ED for evaluation. 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 He was placed NPO and NG tube placed. He improved with medical management. GI consulted and recommended scop but he declined.  He was placed on PPI as outpatient and recommended follow up with GI.  He has been doing well and ready to discharge back to AL He did note diarrhea yesterday and took imodium which improved symptoms.  No abdominal pain, nausea, vomiting or diarrhea at this time.    Past Medical History:  Diagnosis Date   Arthritis    Bilateral renal cysts    Bladder cancer [facility])    urologist-  dr ceil   BPH (benign prostatic hyperplasia)    Coronary artery  disease    hx PCI w/ stenting in 10/ 2014 in [place] --- recently moved from [place]  jan 2018 has not established a cardiologist yet but does have pcp    Dyslipidemia    Essential hypertension    GERD (gastroesophageal reflux disease)    Hematuria    History of gastric ulcer    History of hiatal hernia    History of kidney stones    History of melanoma in situ    several Excision's in situ and malignant melanoma's--  last one 06/ 2017 top of ear excision Stage 1 w/ negative margins/  other have been right upper arm, shoulder, chest, face   History of MI (myocardial infarction)    10/ 2014-- s/p  PCI and stenting x2   History of small bowel obstruction    fall 2017  s/p  abdominal lysis adhesions  (prior hx bowel obstruction total 8 times)   Nocturia    S/P right coronary artery (RCA) stent placement    10/ 2014  x2   Type 2 diabetes, diet controlled (HCC)    Wears glasses    Wears hearing aid    bilateral   Past Surgical History:  Procedure Laterality Date   ABDOMINAL HERNIA REPAIR  2012   ADDOMINAL LYSIS ADHESIONS  fall 2017   sbo   CHOLECYSTECTOMY OPEN  1971   and Appendectomy   CORONARY ANGIOPLASTY WITH STENT PLACEMENT  04-17-2013  in [place]    x2 stents to RCA   INGUINAL HERNIA REPAIR Bilateral 1995   TOTAL HIP ARTHROPLASTY Left 2008   TRANSURETHRAL RESECTION OF BLADDER TUMOR  09/2006   LEIOMYOMA    TRANSURETHRAL RESECTION OF BLADDER TUMOR WITH MITOMYCIN -C N/A 09/02/2016   Procedure: TRANSURETHRAL RESECTION OF BLADDER TUMOR WITH MITOMYCIN -C;  Surgeon: Mark Ottelin, MD;  Location: [facility];  Service: Urology;  Laterality: N/A;    Allergies  Allergen Reactions   Fenofibrate     Other reaction(s): Muscle Pain   Losartan  Other (See Comments)    Caused weakness, chest pressure, and shortness of breath    Outpatient Encounter Medications as of 07/04/2023  Medication Sig   acetaminophen  (TYLENOL ) 325 MG tablet Take 650 mg by mouth every 4 (four)  hours as needed.   Ascorbic Acid 500 MG CAPS Take 500 mg by mouth daily.   aspirin  EC 81 MG tablet Take 81 mg by mouth daily.   bismuth subsalicylate (PEPTO BISMOL) 262 MG/15ML suspension Take 30 mLs by mouth every 4 (four) hours as needed.   cetirizine (ZYRTEC) 10 MG tablet Take 10 mg by mouth daily as needed for allergies.   Cholecalciferol 50 MCG (2000 UT) TABS Take 1 tablet by mouth daily.   Cinnamon  500 MG capsule Take 1,000 mg by mouth in the morning and at bedtime.   Coenzyme Q10 100 MG capsule Take 100 mg by mouth daily.   cyanocobalamin  1000 MCG tablet Take 1 tablet (1,000 mcg total) by mouth daily.   finasteride  (PROSCAR ) 5 MG tablet Take 5 mg by mouth every evening.    furosemide (LASIX) 40 MG tablet Take 40 mg by mouth. Every 24 hours as needed for weight gain of 3lbx overnight or 5lbs in week take with potassium   guaiFENesin (ROBITUSSIN) 100 MG/5ML liquid Take 10 mLs by mouth every 4 (four) hours as needed for cough or to loosen phlegm.   loperamide (IMODIUM A-D) 2 MG tablet Take 4 mg by mouth as needed for diarrhea or loose stools.   melatonin (MELATONIN MAXIMUM STRENGTH) 5 MG TABS Take 5 mg by mouth at bedtime as needed.   metoprolol  succinate (TOPROL -XL) 50 MG 24 hr tablet Take 1 tablet (50 mg total) by mouth daily.   Multiple Vitamin (MULTIVITAMIN WITH MINERALS) TABS tablet Take 1 tablet by mouth daily.   Multiple Vitamins-Minerals (PRESERVISION AREDS 2 PO) Take 1 tablet by mouth 2 (two) times daily.   Omega-3 Fatty Acids (SUPER OMEGA 3 EPA/DHA) 1000 MG CAPS Take 1 capsule by mouth daily. Omega 3 - DHA - EPA - Fish oil-- 1000mg / 120mg / 180mg    pantoprazole  (PROTONIX ) 40 MG tablet Take 1 tablet (40 mg total) by mouth daily.   polyethylene glycol powder (GLYCOLAX/MIRALAX) 17 GM/SCOOP powder Take by mouth daily as needed.   potassium chloride  SA (KLOR-CON  M) 20 MEQ tablet Take 20 mEq by mouth daily.   rivastigmine  (EXELON ) 1.5 MG capsule Take 1.5 mg by mouth 2 (two) times daily.    spironolactone  (ALDACTONE ) 25 MG tablet Take 25 mg by mouth daily.   tamsulosin  (FLOMAX ) 0.4 MG CAPS capsule Take 0.4 mg by mouth every evening.    traZODone  (DESYREL ) 50 MG tablet Take 50 mg by mouth at bedtime.   Wheat Dextrin (BENEFIBER PO) Give 2 tsp by mouth once daily.   Zinc Oxide (TRIPLE PASTE) 12.8 % ointment Apply 1 Application topically. Every shift for redness.   ondansetron  (ZOFRAN ) 4 MG tablet Take 4 mg by  mouth daily as needed for nausea or vomiting. (Patient not taking: Reported on 06/30/2023)   No facility-administered encounter medications on file as of 07/04/2023.    Review of Systems  Constitutional:  Negative for activity change, appetite change, fatigue and unexpected weight change.  HENT:  Negative for congestion and hearing loss.   Eyes: Negative.   Respiratory:  Negative for cough and shortness of breath.   Cardiovascular:  Negative for chest pain, palpitations and leg swelling.  Gastrointestinal:  Negative for abdominal pain, constipation and diarrhea.  Genitourinary:  Negative for difficulty urinating and dysuria.  Musculoskeletal:  Negative for arthralgias and myalgias.  Skin:  Negative for color change and wound.  Neurological:  Negative for dizziness and weakness.  Psychiatric/Behavioral:  Negative for agitation, behavioral problems and confusion.     Immunization History  Administered Date(s) Administered   Fluzone Influenza virus vaccine,trivalent (IIV3), split virus 03/27/2013, 03/27/2014   H1N1 07/08/2008   Influenza, High Dose Seasonal PF 04/13/2021, 04/04/2022   Influenza-Unspecified 03/27/2000, 03/27/2001, 04/27/2001, 03/27/2002, 03/28/2003, 03/27/2004, 03/27/2005, 04/27/2006, 04/19/2007, 04/09/2008, 03/16/2009, 04/07/2010, 04/12/2011   Moderna Sars-Covid-2 Vaccination 07/17/2019, 08/14/2019, 09/11/2019, 06/30/2020   PPD Test 06/02/2020   Pneumococcal Conjugate,unspecified 06/27/2013   Pneumococcal-Unspecified 04/28/1999, 03/27/2001, 04/11/2011    Zoster, Live 03/27/2012   Pertinent  Health Maintenance Due  Topic Date Due   HEMOGLOBIN A1C  Never done   OPHTHALMOLOGY EXAM  Never done   FOOT EXAM  10/29/2019   INFLUENZA VACCINE  01/26/2023      07/03/2020    1:13 AM 07/03/2020    9:00 AM 07/03/2020    9:05 PM 01/14/2021    9:22 PM 07/22/2021    4:39 PM  Fall Risk  (RETIRED) Patient Fall Risk Level High fall risk High fall risk High fall risk Low fall risk Moderate fall risk   Functional Status Survey:    Vitals:   07/04/23 1114  BP: 136/73  Pulse: 60  Resp: 14  Temp: (!) 97.4 F (36.3 C)  SpO2: 95%  Weight: 192 lb 12.8 oz (87.5 kg)  Height: 5' 4 (1.626 m)   Body mass index is 33.09 kg/m. Physical Exam Constitutional:      General: He is not in acute distress.    Appearance: He is well-developed. He is not diaphoretic.  HENT:     Head: Normocephalic and atraumatic.     Right Ear: External ear normal.     Left Ear: External ear normal.     Mouth/Throat:     Pharynx: No oropharyngeal exudate.  Eyes:     Conjunctiva/sclera: Conjunctivae normal.     Pupils: Pupils are equal, round, and reactive to light.  Cardiovascular:     Rate and Rhythm: Normal rate and regular rhythm.     Heart sounds: Normal heart sounds.  Pulmonary:     Effort: Pulmonary effort is normal.     Breath sounds: Normal breath sounds.  Abdominal:     General: Bowel sounds are normal.     Palpations: Abdomen is soft.  Musculoskeletal:        General: No tenderness.     Cervical back: Normal range of motion and neck supple.     Right lower leg: No edema.     Left lower leg: No edema.  Skin:    General: Skin is warm and dry.  Neurological:     Mental Status: He is alert and oriented to person, place, and time.     Labs reviewed: Recent Labs  12/28/22 9048 12/29/22 9358 12/30/22 0445 06/11/23 2202 06/14/23 0441 06/15/23 0626 06/16/23 0532 06/26/23 0000  NA 131* 130* 133*   < > 130* 132* 135 134*  K 3.8 3.5 3.7   < > 3.2* 3.1*  3.7 4.2  CL 101 100 100   < > 94* 96* 99 97*  CO2 23 23 25    < > 26 24 28  29*  GLUCOSE 158* 124* 119*   < > 161* 163* 122*  --   BUN 14 11 10    < > 12 13 15 16   CREATININE 0.72 0.80 0.81   < > 0.74 0.98 0.72 0.8  CALCIUM  7.9* 7.9* 8.2*   < > 8.3* 8.5* 8.3* 8.8  MG 2.1 1.9 1.9  --   --  1.8  --   --   PHOS 2.4* 2.5 2.8  --   --   --   --   --    < > = values in this interval not displayed.   Recent Labs    12/26/22 0200 06/11/23 2202  AST 18 19  ALT 13 13  ALKPHOS 63 76  BILITOT 0.4 0.9  PROT 6.7 7.3  ALBUMIN 3.3* 3.8   Recent Labs    12/26/22 0200 12/27/22 0414 06/14/23 0441 06/15/23 0626 06/16/23 0532 06/26/23 0000  WBC 11.4*   < > 11.5* 11.8* 7.7 8.1  NEUTROABS 7.9*  --   --   --   --  5,540.00  HGB 11.4*   < > 12.8* 13.6 11.6* 13.0*  HCT 36.3*   < > 36.8* 40.0 34.0* 39*  MCV 78.9*   < > 88.2 89.3 88.8  --   PLT 337   < > 289 263 258 352   < > = values in this interval not displayed.   Lab Results  Component Value Date   TSH 1.567 10/07/2016   No results found for: HGBA1C No results found for: CHOL, HDL, LDLCALC, LDLDIRECT, TRIG, CHOLHDL  Significant Diagnostic Results in last 30 days:  DG Abd Portable 1V-Small Bowel Obstruction Protocol-initial, 8 hr delay Result Date: 06/15/2023 CLINICAL DATA:  8 hour delay for small bowel obstruction. EXAM: PORTABLE ABDOMEN - 1 VIEW COMPARISON:  Abdominal series 06/14/2023. FINDINGS: Contrast is seen throughout nondilated colon to the level the rectum. Dilated air-filled small bowel loops persist measuring up to 4 cm. Enteric tube tip is in the body of the stomach, unchanged. Surgical coils overlie the abdomen, unchanged. Left hip arthroplasty is present. IMPRESSION: 1. Contrast is seen throughout nondilated colon to the level the rectum. 2. Dilated air-filled small bowel loops persist measuring up to 4 cm may represent ileus or partial obstruction. Electronically Signed   By: Greig Pique M.D.   On: 06/15/2023  01:20   DG Chest Port 1 View Result Date: 06/14/2023 CLINICAL DATA:  Cough EXAM: PORTABLE CHEST 1 VIEW COMPARISON:  06/14/2023, 04/22/2021 FINDINGS: Enteric tube tip below the diaphragm but incompletely visualized. Small loculated right pleural effusion without significant change. Minimal atelectasis left base. Stable cardiomediastinal silhouette. No pneumothorax IMPRESSION: Small loculated right pleural effusion without significant change. Minimal atelectasis left base. Electronically Signed   By: Luke Bun M.D.   On: 06/14/2023 18:44   DG ABD ACUTE 2+V W 1V CHEST Result Date: 06/14/2023 CLINICAL DATA:  Small-bowel obstruction EXAM: DG ABDOMEN ACUTE WITH 1 VIEW CHEST COMPARISON:  06/13/2023 FINDINGS: Supine and upright frontal views of the abdomen and pelvis as well as an upright frontal view of the chest  are obtained. Cardiac silhouette is unremarkable. Small partially loculated right pleural effusion unchanged. No airspace disease or pneumothorax. Enteric catheter passes below diaphragm, tip projecting over the gastric body. Mildly distended gas-filled loops of small bowel within the central abdomen measuring up to 4 cm. Stable gas and stool throughout the colon. No masses or abnormal calcifications. No free gas in the greater peritoneal sac. IMPRESSION: 1. Stable gaseous distention of the small bowel within the central abdomen, compatible with small-bowel obstruction. 2. Enteric catheter passing below diaphragm, tip and side port projecting over the region of the gastric body. 3. Small right pleural effusion unchanged. Electronically Signed   By: Ozell Daring M.D.   On: 06/14/2023 10:38   DG Abd 2 Views Result Date: 06/13/2023 CLINICAL DATA:  Small-bowel obstruction EXAM: ABDOMEN - 2 VIEW COMPARISON:  06/12/2023 FINDINGS: Supine and upright frontal views of the abdomen and pelvis are obtained. Enteric catheter tip and side port project over the distal thoracic esophagus likely within the known  small hiatal hernia, recommend advancing at least 4 cm to ensure side port placement within the subdiaphragmatic stomach. Distended gas-filled loops of small bowel are seen primarily within the left mid abdomen, measuring up to 4 cm in diameter. Minimal gas and stool again seen throughout the colon. No free gas in the greater peritoneal sac. Trace right pleural effusion again noted. Otherwise the lung bases are clear. IMPRESSION: 1. Stable gaseous distension of the proximal small bowel consistent with small-bowel obstruction. 2. Enteric catheter tip projecting over the distal thoracic esophagus in the region of the known small hiatal hernia. Recommend advancing 4 cm to ensure side port placement within the gastric lumen. 3. Small right pleural effusion. Electronically Signed   By: Ozell Daring M.D.   On: 06/13/2023 09:45   DG Abdomen 1 View Result Date: 06/12/2023 CLINICAL DATA:  Confirm NG tube placement EXAM: ABDOMEN - 1 VIEW COMPARISON:  Radiograph 06/12/2023 at 6:23 a.m. FINDINGS: Underpenetrated radiograph. NG tube tip projects over the expected location of the stomach. The side-port is not confidently visualized. IMPRESSION: NG tube tip projects over the expected location of the stomach. Side port is not definitively visualized. Electronically Signed   By: Norman Gatlin M.D.   On: 06/12/2023 20:04   DG Abdomen 1 View Result Date: 06/12/2023 CLINICAL DATA:  NG tube placement EXAM: ABDOMEN - 1 VIEW COMPARISON:  06/11/2023 FINDINGS: Interval placement of enteric tube with tip and side port below the level of the GE junction. Mild gaseous distension of the stomach. Persistent mild small bowel dilatation within the left hemiabdomen. Fasteners for ventral abdominal wall hernia mesh are again noted. Multilevel thoracolumbar spondylosis. IMPRESSION: 1. Interval placement of enteric tube with tip and side port below the level of the GE junction. 2. Persistent mild small bowel dilatation within the left  hemiabdomen. Electronically Signed   By: Waddell Calk M.D.   On: 06/12/2023 06:34   CT ABDOMEN PELVIS W CONTRAST Result Date: 06/11/2023 CLINICAL DATA:  Abdominal pain and distention EXAM: CT ABDOMEN AND PELVIS WITH CONTRAST TECHNIQUE: Multidetector CT imaging of the abdomen and pelvis was performed using the standard protocol following bolus administration of intravenous contrast. RADIATION DOSE REDUCTION: This exam was performed according to the departmental dose-optimization program which includes automated exposure control, adjustment of the mA and/or kV according to patient size and/or use of iterative reconstruction technique. CONTRAST:  OMNIPAQUE  IOHEXOL  300 MG/ML  SOLN COMPARISON:  CT abdomen and pelvis 12/26/2022 FINDINGS: Lower chest: Unchanged distention of the  distal esophagus with small hiatal hernia and fluid refluxed into the esophagus. Unchanged small loculated right pleural effusion and associated airspace opacities. Bronchial wall thickening and mucous plugging in the right lower lobe. Hepatobiliary: No acute abnormality. Cholecystectomy. No biliary dilation. Pancreas: Fatty atrophy.  No acute abnormality. Spleen: Unremarkable. Adrenals/Urinary Tract: Stable adrenal glands and kidneys. No urinary calculi or hydronephrosis. Unremarkable bladder. Stomach/bowel: Mild dilation of loops of small bowel in the left hemiabdomen measuring up to 3.3 cm in diameter. There is smooth tapering to the ileum which is decompressed. No discrete transition point is identified. Normal caliber colon without wall thickening. Extensive colonic diverticulosis. No diverticulitis. The appendix is not definitively visualized. Vascular/Lymphatic: Aortic atherosclerosis. No enlarged abdominal or pelvic lymph nodes. Reproductive: Enlarged prostate. Other: Mesenteric edema and trace fluid about the dilated loops of small bowel. No free intraperitoneal air. Postoperative change about the ventral abdominal wall.  Musculoskeletal: Left THA.  No acute fracture. IMPRESSION: 1. Mild dilation of loops of small bowel in the left hemiabdomen measuring up to 3.3 cm in diameter. There is smooth tapering to the ileum which is decompressed. No discrete transition point is identified. Findings may represent ileus though early or partial small bowel obstruction is difficult to exclude. 2. Unchanged small loculated right pleural effusion and associated airspace opacities. Bronchial wall thickening and mucous plugging in the right lower lobe. 3. Unchanged distention of the distal esophagus with small hiatal hernia and fluid refluxed into the esophagus. Aortic Atherosclerosis (ICD10-I70.0). Electronically Signed   By: Norman Gatlin M.D.   On: 06/11/2023 23:20    Assessment/Plan 1. Small bowel obstruction (HCC) (Primary) Symptoms stable at this time, medical management with GI follow up at this time   2. Essential hypertension -Blood pressure well controlled, goal bp <140/90 Continue current medications and dietary modifications follow metabolic panel  3. Diarrhea, unspecified type Noted yesterday but resolved after imodium, advised to use caution due to hx of SBO Can add benefiber daily  4. Diabetes mellitus type 2, diet-controlled (HCC) Continue dietary modifications, follow up with PCP  5. Iron  deficiency anemia, unspecified iron  deficiency anemia type Stable on recent   6. Coronary artery disease involving native coronary artery of native heart without angina pectoris -stable, no chest pains, noted Continues ASA  7. GERD without esophagitis Continues on protonix  daily, no symptoms at this time  8. Benign prostatic hyperplasia, unspecified whether lower urinary tract symptoms present Stable on flomax   pt is stable for discharge-will need PT/OT per home health. No DME needed.   Krissa Utke K. Caro BODILY [facility] [phone]

## 2023-07-29 ENCOUNTER — Emergency Department: Payer: Medicare Other

## 2023-07-29 ENCOUNTER — Emergency Department
Admission: EM | Admit: 2023-07-29 | Discharge: 2023-07-29 | Disposition: A | Discharge status: Home / Self Care | Payer: Medicare Other | Attending: Emergency Medicine | Admitting: Emergency Medicine

## 2023-07-29 ENCOUNTER — Encounter: Payer: Self-pay | Admitting: Emergency Medicine

## 2023-07-29 ENCOUNTER — Other Ambulatory Visit: Payer: Self-pay

## 2023-07-29 DIAGNOSIS — J101 Influenza due to other identified influenza virus with other respiratory manifestations: Secondary | ICD-10-CM

## 2023-07-29 DIAGNOSIS — J9801 Acute bronchospasm: Secondary | ICD-10-CM | POA: Diagnosis not present

## 2023-07-29 DIAGNOSIS — I2510 Atherosclerotic heart disease of native coronary artery without angina pectoris: Secondary | ICD-10-CM | POA: Diagnosis not present

## 2023-07-29 DIAGNOSIS — Z20822 Contact with and (suspected) exposure to covid-19: Secondary | ICD-10-CM | POA: Diagnosis not present

## 2023-07-29 DIAGNOSIS — I10 Essential (primary) hypertension: Secondary | ICD-10-CM | POA: Insufficient documentation

## 2023-07-29 DIAGNOSIS — J09X2 Influenza due to identified novel influenza A virus with other respiratory manifestations: Secondary | ICD-10-CM | POA: Insufficient documentation

## 2023-07-29 DIAGNOSIS — R0602 Shortness of breath: Secondary | ICD-10-CM | POA: Diagnosis present

## 2023-07-29 LAB — CBC WITH DIFFERENTIAL/PLATELET
Abs Immature Granulocytes: 0.02 10*3/uL (ref 0.00–0.07)
Basophils Absolute: 0 10*3/uL (ref 0.0–0.1)
Basophils Relative: 0 %
Eosinophils Absolute: 0.2 10*3/uL (ref 0.0–0.5)
Eosinophils Relative: 3 %
HCT: 39.4 % (ref 39.0–52.0)
Hemoglobin: 12.9 g/dL — ABNORMAL LOW (ref 13.0–17.0)
Immature Granulocytes: 0 %
Lymphocytes Relative: 35 %
Lymphs Abs: 1.8 10*3/uL (ref 0.7–4.0)
MCH: 29.9 pg (ref 26.0–34.0)
MCHC: 32.7 g/dL (ref 30.0–36.0)
MCV: 91.2 fL (ref 80.0–100.0)
Monocytes Absolute: 0.3 10*3/uL (ref 0.1–1.0)
Monocytes Relative: 6 %
Neutro Abs: 2.9 10*3/uL (ref 1.7–7.7)
Neutrophils Relative %: 56 %
Platelets: 238 10*3/uL (ref 150–400)
RBC: 4.32 MIL/uL (ref 4.22–5.81)
RDW: 13.8 % (ref 11.5–15.5)
WBC: 5.2 10*3/uL (ref 4.0–10.5)
nRBC: 0 % (ref 0.0–0.2)

## 2023-07-29 LAB — RESP PANEL BY RT-PCR (RSV, FLU A&B, COVID)  RVPGX2
Influenza A by PCR: POSITIVE — AB
Influenza B by PCR: NEGATIVE
Resp Syncytial Virus by PCR: NEGATIVE
SARS Coronavirus 2 by RT PCR: NEGATIVE

## 2023-07-29 LAB — BASIC METABOLIC PANEL
Anion gap: 9 (ref 5–15)
BUN: 12 mg/dL (ref 8–23)
CO2: 29 mmol/L (ref 22–32)
Calcium: 8.6 mg/dL — ABNORMAL LOW (ref 8.9–10.3)
Chloride: 99 mmol/L (ref 98–111)
Creatinine, Ser: 0.79 mg/dL (ref 0.61–1.24)
GFR, Estimated: >60 mL/min (ref >60)
Glucose, Bld: 127 mg/dL — ABNORMAL HIGH (ref 70–99)
Potassium: 4 mmol/L (ref 3.5–5.1)
Sodium: 137 mmol/L (ref 135–145)

## 2023-07-29 LAB — TROPONIN I (HIGH SENSITIVITY): Troponin I (High Sensitivity): 19 ng/L — ABNORMAL HIGH (ref <18)

## 2023-07-29 MED ORDER — ALBUTEROL SULFATE HFA 108 (90 BASE) MCG/ACT IN AERS
2.0000 | INHALATION_SPRAY | Freq: Four times a day (QID) | RESPIRATORY_TRACT | 2 refills | Status: DC | PRN
Start: 2023-07-29 — End: 2024-04-02

## 2023-07-29 MED ORDER — IPRATROPIUM-ALBUTEROL 0.5-2.5 (3) MG/3ML IN SOLN
3.0000 mL | Freq: Once | RESPIRATORY_TRACT | Status: AC
  Administered 2023-07-29: 3 mL via RESPIRATORY_TRACT
  Filled 2023-07-29: qty 3

## 2023-07-29 MED ORDER — CEPHALEXIN 500 MG PO CAPS
500.0000 mg | ORAL_CAPSULE | Freq: Once | ORAL | Status: AC
  Administered 2023-07-29: 500 mg via ORAL
  Filled 2023-07-29: qty 1

## 2023-07-29 MED ORDER — CEPHALEXIN 500 MG PO CAPS: 500.0000 mg | ORAL_CAPSULE | Freq: Three times a day (TID) | ORAL | 0 refills | Status: AC

## 2023-07-29 MED ORDER — AZITHROMYCIN 500 MG PO TABS
500.0000 mg | ORAL_TABLET | Freq: Once | ORAL | Status: AC
  Administered 2023-07-29: 500 mg via ORAL
  Filled 2023-07-29: qty 1

## 2023-07-29 MED ORDER — AZITHROMYCIN 250 MG PO TABS: ORAL_TABLET | ORAL | 0 refills | Status: AC

## 2023-07-29 NOTE — ED Provider Triage Note (Signed)
Emergency Medicine Provider Triage Evaluation Note  Andric Kerce, a 89 y.o. male  was evaluated in triage.  Pt complains of cough and shortness of breath.  Patient had symptoms for the last 3 to 5 days.  He reports increased difficulty breathing overnight and productive cough..  Review of Systems  Positive: Cough  Negative: FCS  Physical Exam  BP (!) 142/62 (BP Location: Left Arm)   Pulse 69   Temp 97.7 F (36.5 C) (Oral)   Resp 16   Ht 5\' 11"  (1.803 m)   Wt 92.5 kg   SpO2 100%   BMI 28.45 kg/m  Gen:   Awake, no distress  NAD Resp:  Normal effort CTA MSK:   Moves extremities without difficulty  Other:    Medical Decision Making  Medically screening exam initiated at 7:07 AM.  Appropriate orders placed.  Talon Bass was informed that the remainder of the evaluation will be completed by another provider, this initial triage assessment does not replace that evaluation, and the importance of remaining in the ED until their evaluation is complete.  Geriatric patient to the ED for evaluation of shortness of breath and cough.  He presents from his facility for evaluation.   Lissa Hoard, PA-C 07/29/23 [phone]

## 2023-07-29 NOTE — ED Notes (Signed)
Patient asked to use the restroom prior to receiving his breathing treatment. Patient was provided with a walker that was present in the room and was able to ambulate to the bathroom located in the room.

## 2023-07-29 NOTE — ED Triage Notes (Signed)
Pt to ED via [facility] from [facility] for cough and shortness of breath. Pt states that he has had symptoms since the beginning of the week. Pt states that last night he had a lot of difficulty breathing and coughing up sputum. Pt is in NAD.

## 2023-07-29 NOTE — ED Provider Notes (Signed)
[facility] Provider Note    Event Date/Time   First MD Initiated Contact with Patient 07/29/23 [phone]     (approximate)   History   Cough and Shortness of Breath   HPI  Tyler Pena is a 89 y.o. male with a history of CAD, BPH, hypertension, GERD, pneumonia who presents with complaints of cough, mild shortness of breath overnight.  Patient reports he developed a cold 5 days ago, had negative influenza and COVID at his [facility] facility.  Reports last night productive cough mild shortness of breath while coughing, was sent to the emergency department for x-ray.  Currently feels well, no shortness of breath, no chest pain     Physical Exam   Triage Vital Signs: ED Triage Vitals [07/29/23 0702]  Encounter Vitals Group     BP (!) 142/62     Systolic BP Percentile      Diastolic BP Percentile      Pulse Rate 69     Resp 16     Temp 97.7 F (36.5 C)     Temp Source Oral     SpO2 100 %     Weight 92.5 kg (204 lb)     Height 1.803 m (5\' 11" )     Head Circumference      Peak Flow      Pain Score 0     Pain Loc      Pain Education      Exclude from Growth Chart     Most recent vital signs: Vitals:   07/29/23 0702  BP: (!) 142/62  Pulse: 69  Resp: 16  Temp: 97.7 F (36.5 C)  SpO2: 100%     General: Awake, no distress.  Pleasant interactive CV:  Good peripheral perfusion.  Resp:  Normal effort.  Scattered wheezing Abd:  No distention.  Other:     ED Results / Procedures / Treatments   Labs (all labs ordered are listed, but only abnormal results are displayed) Labs Reviewed  RESP PANEL BY RT-PCR (RSV, FLU A&B, COVID)  RVPGX2 - Abnormal; Notable for the following components:      Result Value   Influenza A by PCR POSITIVE (*)    All other components within normal limits  CBC WITH DIFFERENTIAL/PLATELET - Abnormal; Notable for the following components:   Hemoglobin 12.9 (*)    All other components within normal limits  BASIC  METABOLIC PANEL - Abnormal; Notable for the following components:   Glucose, Bld 127 (*)    Calcium 8.6 (*)    All other components within normal limits  TROPONIN I (HIGH SENSITIVITY) - Abnormal; Notable for the following components:   Troponin I (High Sensitivity) 19 (*)    All other components within normal limits     EKG  ED ECG REPORT I, Jene Every, the attending physician, personally viewed and interpreted this ECG.  Date: 07/29/2023  Rhythm: normal sinus rhythm QRS Axis: normal Intervals: normal ST/T Wave abnormalities: normal Narrative Interpretation: no evidence of acute ischemia    RADIOLOGY Chest x-ray viewed interpret by me, right side pleural effusion    PROCEDURES:  Critical Care performed:   Procedures   MEDICATIONS ORDERED IN ED: Medications  ipratropium-albuterol (DUONEB) 0.5-2.5 (3) MG/3ML nebulizer solution 3 mL (3 mLs Nebulization Given 07/29/23 0905)  ipratropium-albuterol (DUONEB) 0.5-2.5 (3) MG/3ML nebulizer solution 3 mL (3 mLs Nebulization Given 07/29/23 0905)  azithromycin (ZITHROMAX) tablet 500 mg (500 mg Oral Given 07/29/23 0904)  cephALEXin (KEFLEX)  capsule 500 mg (500 mg Oral Given 07/29/23 0903)     IMPRESSION / MDM / ASSESSMENT AND PLAN / ED COURSE  I reviewed the triage vital signs and the nursing notes. Patient's presentation is most consistent with acute presentation with potential threat to life or bodily function.  Patient presents with symptoms as described above, differential includes pneumonia, viral upper respiratory infection, pleural effusion  Overall well-appearing on exam, scattered wheezing to suggest bronchospasm  X-ray demonstrates chronic pleural effusion, questionable atelectasis versus pneumonia adjacent, white blood cell count is normal.  Minimally elevated troponin, patient has no chest pain I suspect this is chronic  Will treat with DuoNebs, azithromycin, Keflex, anticipate  discharge  ----------------------------------------- 9:09 AM on 07/29/2023 ----------------------------------------- Influenza A PCR positive  ----------------------------------------- 9:26 AM on 07/29/2023 ----------------------------------------- Patient has completed DuoNebs, feeling much better, appropriate for discharge with supportive care, given his history p.o. antibiotics      FINAL CLINICAL IMPRESSION(S) / ED DIAGNOSES   Final diagnoses:  Influenza A  Bronchospasm     Rx / DC Orders   ED Discharge Orders          Ordered    azithromycin (ZITHROMAX Z-PAK) 250 MG tablet        07/29/23 0925    cephALEXin (KEFLEX) 500 MG capsule  3 times daily        07/29/23 0925    albuterol (VENTOLIN HFA) 108 (90 Base) MCG/ACT inhaler  Every 6 hours PRN        07/29/23 0925             Note:  This document was prepared using Dragon voice recognition software and may include unintentional dictation errors.   Jene Every, MD 07/29/23 [phone]

## 2023-07-29 NOTE — ED Notes (Signed)
First Nurse Note: Pt to ED via [facility] from [facility] for cough. Pt thinks he may have had an URI last week. Pt is is in NAD.   BP- 155/74 HR- 70 SpO2-96% Temp- 98.5

## 2023-07-29 NOTE — ED Notes (Signed)
See triage notes. Patient c/o cough for the past week. Patient stated he was self-quarantining at the facility but that it has not gotten any better.

## 2023-07-29 NOTE — ED Notes (Signed)
Pt informed that his daughter, Judeth Cornfield, called to check on him and that he will be here in about a hour. Pt verbalized understanding.

## 2023-08-06 ENCOUNTER — Encounter: Payer: Self-pay | Admitting: Student

## 2023-08-06 NOTE — Progress Notes (Signed)
 Location:  Other Nursing Home Room Number: [facility] [address] Place of Service:  ALF [phone]) Provider:  Alver Jobs, Lisa Rideau, MD  Patient Care Team: Valrie Gehrig, MD as PCP - [facility])  Extended Emergency Contact Information Primary Emergency Contact: Biswurm,Suzanne Address: [address]          [place], [place] [address] [place] Home Phone: [phone] Relation: Daughter Secondary Emergency Contact: Bizwurm,Richard Address: [address]          [place], [place] [address] [place] Home Phone: [phone] Relation: Son  Code Status:  DNR Goals of care: Advanced Directive information    07/29/2023    7:05 AM  Advanced Directives  Does Patient Have a Medical Advance Directive? Yes  Type of Advance Directive Out of facility DNR (pink MOST or yellow form)     Chief Complaint  Patient presents with   Medical Management of Chronic Conditions    HPI:  Pt is a 89 y.o. male seen today for medical management of chronic diseases.   Discussed the use of AI scribe software for clinical note transcription with the patient, who gave verbal consent to proceed.  History of Present Illness         History of Present Illness The patient, with knee pain and Lewy body dementia, presents with concerns about his knee and ongoing medical management.  He has ongoing knee pain and is currently undergoing physical therapy and using a knee brace. He finds the therapy somewhat helpful but describes the brace as inadequate. He notes a decrease in height from six foot one to five feet eleven, attributing it to loss of cartilage and vertebral disc space.  He has a history of multiple small bowel obstructions due to adhesions near the ileocecal valve. These episodes are managed with nasogastric decompression in the emergency room, which typically resolves the obstruction after two to three days. General surgery is not advised due to his heart  condition and inoperable aneurysms in the chest.  He has a history of heart disease, with a stent placed in a posterior artery many years ago. He experiences some shortness of breath but no chest pain. He takes spironolactone  and metoprolol  for blood pressure management and uses furosemide as needed for leg swelling.  He has been diagnosed with Lewy body dementia, identified through symptoms of hallucinations and confirmed by an electroencephalogram. He takes rivastigmine  to manage hallucinations associated with this condition.  He is a diabetic with type 2 diabetes, which he manages through dietary choices, carefully selecting controlled portions of carbohydrates. He also takes various vitamins and herbs, including turmeric and cinnamon , and uses omega-3 fatty acids, zinc oxide, and Voltaren gel for topical pain relief.  He reports a recent episode of diarrhea, which he attributes to a course of Keflex  prescribed for a suspected lung infection. He has been consuming yogurt to help restore his gut microbiome.  He takes melatonin for sleep and has discontinued trazodone  as it was ineffective. He also takes tamsulosin  and finasteride  for benign prostatic hyperplasia.  He has a history of a dislocated finger that was not surgically corrected due to the risks associated with anesthesia. He has chosen to live with the deformity as it does not significantly impact his daily activities.  Past Medical History:  Diagnosis Date   Arthritis    Bilateral renal cysts    Bladder cancer [facility])    urologist-  dr Grady Lawman   BPH (benign prostatic hyperplasia)  Coronary artery disease    hx PCI w/ stenting in 10/ 2014 in [place] --- recently moved from [place]  jan 2018 has not established a cardiologist yet but does have pcp    Dyslipidemia    Essential hypertension    GERD (gastroesophageal reflux disease)    Hematuria    History of gastric ulcer    History of hiatal hernia    History of kidney  stones    History of melanoma in situ    several Excision's in situ and malignant melanoma's--  last one 06/ 2017 top of ear excision Stage 1 w/ negative margins/  other have been right upper arm, shoulder, chest, face   History of MI (myocardial infarction)    10/ 2014-- s/p  PCI and stenting x2   History of small bowel obstruction    fall 2017  s/p  abdominal lysis adhesions  (prior hx bowel obstruction total 8 times)   Nocturia    S/P right coronary artery (RCA) stent placement    10/ 2014  x2   Type 2 diabetes, diet controlled (HCC)    Wears glasses    Wears hearing aid    bilateral   Past Surgical History:  Procedure Laterality Date   ABDOMINAL HERNIA REPAIR  2012   ADDOMINAL LYSIS ADHESIONS  fall 2017   sbo   CHOLECYSTECTOMY OPEN  1971   and Appendectomy   CORONARY ANGIOPLASTY WITH STENT PLACEMENT  04-17-2013  in [place]    x2 stents to RCA   INGUINAL HERNIA REPAIR Bilateral 1995   TOTAL HIP ARTHROPLASTY Left 2008   TRANSURETHRAL RESECTION OF BLADDER TUMOR  09/2006   LEIOMYOMA    TRANSURETHRAL RESECTION OF BLADDER TUMOR WITH MITOMYCIN -C N/A 09/02/2016   Procedure: TRANSURETHRAL RESECTION OF BLADDER TUMOR WITH MITOMYCIN -C;  Surgeon: Mark Ottelin, MD;  Location: [facility];  Service: Urology;  Laterality: N/A;    Allergies  Allergen Reactions   Fenofibrate     Other reaction(s): Muscle Pain   Losartan  Other (See Comments)    Caused weakness, chest pressure, and shortness of breath    Outpatient Encounter Medications as of 08/07/2023  Medication Sig   acetaminophen  (TYLENOL ) 325 MG tablet Take 650 mg by mouth every 4 (four) hours as needed.   albuterol  (VENTOLIN  HFA) 108 (90 Base) MCG/ACT inhaler Inhale 2 puffs into the lungs every 6 (six) hours as needed for wheezing or shortness of breath.   Ascorbic Acid 500 MG CAPS Take 500 mg by mouth daily.   aspirin  EC 81 MG tablet Take 81 mg by mouth daily.   bismuth subsalicylate (PEPTO BISMOL) 262 MG/15ML  suspension Take 30 mLs by mouth every 4 (four) hours as needed.   cetirizine (ZYRTEC) 10 MG tablet Take 10 mg by mouth daily as needed for allergies.   Cholecalciferol 50 MCG (2000 UT) TABS Take 1 tablet by mouth daily.   Cinnamon  500 MG capsule Take 1,000 mg by mouth in the morning and at bedtime.   Coenzyme Q10 100 MG capsule Take 100 mg by mouth daily.   finasteride  (PROSCAR ) 5 MG tablet Take 5 mg by mouth every evening.    furosemide (LASIX) 40 MG tablet Take 40 mg by mouth. Every 24 hours as needed for weight gain of 3lbx overnight or 5lbs in week take with potassium   guaiFENesin (ROBITUSSIN) 100 MG/5ML liquid Take 10 mLs by mouth every 4 (four) hours as needed for cough or to loosen phlegm.   loperamide (IMODIUM A-D)  2 MG tablet Take 4 mg by mouth as needed for diarrhea or loose stools.   melatonin (MELATONIN MAXIMUM STRENGTH) 5 MG TABS Take 5 mg by mouth at bedtime as needed.   metoprolol  succinate (TOPROL -XL) 50 MG 24 hr tablet Take 1 tablet (50 mg total) by mouth daily.   Multiple Vitamin (MULTIVITAMIN WITH MINERALS) TABS tablet Take 1 tablet by mouth daily.   Multiple Vitamins-Minerals (PRESERVISION AREDS 2 PO) Take 1 tablet by mouth 2 (two) times daily.   Omega-3 Fatty Acids (SUPER OMEGA 3 EPA/DHA) 1000 MG CAPS Take 1 capsule by mouth daily. Omega 3 - DHA - EPA - Fish oil-- 1000mg / 120mg / 180mg    pantoprazole  (PROTONIX ) 40 MG tablet Take 1 tablet (40 mg total) by mouth daily.   polyethylene glycol powder (GLYCOLAX/MIRALAX) 17 GM/SCOOP powder Take by mouth daily as needed.   potassium chloride  SA (KLOR-CON  M) 20 MEQ tablet Take 20 mEq by mouth daily.   rivastigmine  (EXELON ) 1.5 MG capsule Take 1.5 mg by mouth 2 (two) times daily.   spironolactone  (ALDACTONE ) 25 MG tablet Take 25 mg by mouth daily.   tamsulosin  (FLOMAX ) 0.4 MG CAPS capsule Take 0.4 mg by mouth every evening.    traZODone  (DESYREL ) 50 MG tablet Take 50 mg by mouth at bedtime.   Wheat Dextrin (BENEFIBER PO) Give 2 tsp by  mouth once daily.   Zinc Oxide (TRIPLE PASTE) 12.8 % ointment Apply 1 Application topically. Every shift for redness.   No facility-administered encounter medications on file as of 08/07/2023.    Review of Systems  Immunization History  Administered Date(s) Administered   Fluzone Influenza virus vaccine,trivalent (IIV3), split virus 03/27/2013, 03/27/2014   H1N1 07/08/2008   Influenza, High Dose Seasonal PF 04/13/2021, 04/04/2022   Influenza-Unspecified 03/27/2000, 03/27/2001, 04/27/2001, 03/27/2002, 03/28/2003, 03/27/2004, 03/27/2005, 04/27/2006, 04/19/2007, 04/09/2008, 03/16/2009, 04/07/2010, 04/12/2011   Moderna Sars-Covid-2 Vaccination 07/17/2019, 08/14/2019, 09/11/2019, 06/30/2020   PPD Test 06/02/2020   Pneumococcal Conjugate,unspecified 06/27/2013   Pneumococcal-Unspecified 04/28/1999, 03/27/2001, 04/11/2011   Zoster, Live 03/27/2012   Pertinent  Health Maintenance Due  Topic Date Due   HEMOGLOBIN A1C  Never done   OPHTHALMOLOGY EXAM  Never done   FOOT EXAM  10/29/2019   INFLUENZA VACCINE  01/26/2023      07/03/2020    1:13 AM 07/03/2020    9:00 AM 07/03/2020    9:05 PM 01/14/2021    9:22 PM 07/22/2021    4:39 PM  Fall Risk  (RETIRED) Patient Fall Risk Level High fall risk High fall risk High fall risk Low fall risk Moderate fall risk   Functional Status Survey:    Vitals:   08/06/23 1939  BP: 130/62  Pulse: 68  Resp: 18  Temp: 98.1 F (36.7 C)  SpO2: 98%  Weight: 204 lb (92.5 kg)   Body mass index is 28.45 kg/m. Physical Exam VITALS: BP- 100/100 General: well-appearing CV: regular rate, well-perfused.  Lungs CTAB Abdomen soft, nondistended, ABS MUSCULOSKELETAL: L Elbowin hand, tenderness on examination, consistent with bursitis.  Labs reviewed: Recent Labs    12/28/22 0951 12/29/22 0641 12/30/22 0445 06/11/23 2202 06/15/23 0626 06/16/23 0532 06/26/23 0000 07/29/23 0705  NA 131* 130* 133*   < > 132* 135 134* 137  K 3.8 3.5 3.7   < > 3.1* 3.7 4.2  4.0  CL 101 100 100   < > 96* 99 97* 99  CO2 23 23 25    < > 24 28 29* 29  GLUCOSE 158* 124* 119*   < >  163* 122*  --  127*  BUN 14 11 10    < > 13 15 16 12   CREATININE 0.72 0.80 0.81   < > 0.98 0.72 0.8 0.79  CALCIUM  7.9* 7.9* 8.2*   < > 8.5* 8.3* 8.8 8.6*  MG 2.1 1.9 1.9  --  1.8  --   --   --   PHOS 2.4* 2.5 2.8  --   --   --   --   --    < > = values in this interval not displayed.   Recent Labs    12/26/22 0200 06/11/23 2202  AST 18 19  ALT 13 13  ALKPHOS 63 76  BILITOT 0.4 0.9  PROT 6.7 7.3  ALBUMIN 3.3* 3.8   Recent Labs    12/26/22 0200 12/27/22 0414 06/15/23 0626 06/16/23 0532 06/26/23 0000 07/29/23 0705  WBC 11.4*   < > 11.8* 7.7 8.1 5.2  NEUTROABS 7.9*  --   --   --  5,540.00 2.9  HGB 11.4*   < > 13.6 11.6* 13.0* 12.9*  HCT 36.3*   < > 40.0 34.0* 39* 39.4  MCV 78.9*   < > 89.3 88.8  --  91.2  PLT 337   < > 263 258 352 238   < > = values in this interval not displayed.   Lab Results  Component Value Date   TSH 1.567 10/07/2016   No results found for: "HGBA1C" No results found for: "CHOL", "HDL", "LDLCALC", "LDLDIRECT", "TRIG", "CHOLHDL"  Significant Diagnostic Results in last 30 days:  DG Chest 2 View Result Date: 07/29/2023 CLINICAL DATA:  One 89 year old male with history of shortness of breath. EXAM: CHEST - 2 VIEW COMPARISON:  Chest x-ray 06/14/2023. FINDINGS: Small to moderate right-sided pleural effusion, likely partially loculated, unchanged compared to the prior study. This is associated with areas of atelectasis and/or consolidation in the right lung base. Left lung is clear. No left pleural effusion. No pneumothorax. No evidence of pulmonary edema. Heart size is normal. Upper mediastinal contours are within normal limits. Atherosclerotic calcifications are noted in the thoracic aorta. IMPRESSION: 1. Persistent small to moderate right-sided pleural effusion, likely partially loculated, with associated areas of atelectasis and/or consolidation in the  right lung base. 2. Aortic atherosclerosis. Electronically Signed   By: Alexandria Angel M.D.   On: 07/29/2023 07:42    Assessment/Plan Knee Pain Chronic knee pain, currently undergoing physical therapy with reported improvement. Intermittent use of a knee brace provides some relief. Height reduction likely due to cartilage loss and vertebral disc degeneration. - Continue physical therapy - Use knee brace as needed  Hx of Small Bowel Obstruction Recurrent small bowel obstructions due to adhesions at the ileocecal valve. Managed with nasogastric tube decompression in the emergency room, typically resolving within 2-3 days. Patient prefers this approach over surgical intervention due to heart condition and aneurysms. - Continue current management with nasogastric tube decompression as needed  Aortic Aneurysm Inoperable aneurysms in the chest. Surgery not an option due to location and heart condition. Patient opts for monitoring. - Monitor aneurysms  Coronary Artery Disease Heart disease with a stent in the posterior artery. Occasional shortness of breath, no chest pain. Patient understands the importance of continuing current cardiac medications. - Continue current cardiac medications  Hypertension Hypertension, well-controlled with current medications. Blood pressure maintained between 100-120 mmHg to manage aneurysm risk. - Continue current antihypertensive medications Type 2 Diabetes Mellitus Type 2 diabetes mellitus, controlled by diet. Patient follows  a specific diet plan to manage blood glucose levels. - Continue current dietary management  Lewy Body Dementia Lewy body dementia, managed with rivastigmine  for hallucinations. Patient experiences hallucinations and falls, understands medication manages symptoms but does not cure the disease. - Continue rivastigmine   Benign Prostatic Hyperplasia (BPH) BPH, managed with tamsulosin  and finasteride  for urinary symptoms. - Continue  tamsulosin  - Continue finasteride   General Health Maintenance Patient takes vitamin C, vitamin D , omega-3 fatty acids, and turmeric. Uses Voltaren gel for pain relief and zinc oxide as a barrier cream as needed. - Continue current vitamin and supplement regimen - Use Voltaren gel as needed - Use zinc oxide barrier cream as needed  Follow-up - Schedule regular follow-up appointments as needed.   Family/ staff Communication: nursing  Labs/tests ordered:  none

## 2023-08-07 ENCOUNTER — Non-Acute Institutional Stay: Payer: Self-pay | Admitting: Student

## 2023-08-07 ENCOUNTER — Encounter: Payer: Self-pay | Admitting: Student

## 2023-08-07 DIAGNOSIS — K56609 Unspecified intestinal obstruction, unspecified as to partial versus complete obstruction: Secondary | ICD-10-CM

## 2023-08-07 DIAGNOSIS — G3183 Dementia with Lewy bodies: Secondary | ICD-10-CM

## 2023-08-07 DIAGNOSIS — E119 Type 2 diabetes mellitus without complications: Secondary | ICD-10-CM

## 2023-08-07 DIAGNOSIS — C801 Malignant (primary) neoplasm, unspecified: Secondary | ICD-10-CM

## 2023-08-07 DIAGNOSIS — I2510 Atherosclerotic heart disease of native coronary artery without angina pectoris: Secondary | ICD-10-CM

## 2023-08-07 DIAGNOSIS — I10 Essential (primary) hypertension: Secondary | ICD-10-CM | POA: Diagnosis not present

## 2023-08-07 DIAGNOSIS — F0280 Dementia in other diseases classified elsewhere without behavioral disturbance: Secondary | ICD-10-CM

## 2023-08-07 DIAGNOSIS — D509 Iron deficiency anemia, unspecified: Secondary | ICD-10-CM

## 2023-08-07 DIAGNOSIS — N400 Enlarged prostate without lower urinary tract symptoms: Secondary | ICD-10-CM

## 2023-09-18 LAB — VITAMIN B12: Vitamin B-12: 1128

## 2023-11-02 ENCOUNTER — Non-Acute Institutional Stay: Payer: Self-pay | Admitting: Nurse Practitioner

## 2023-11-02 ENCOUNTER — Encounter: Payer: Self-pay | Admitting: Nurse Practitioner

## 2023-11-02 DIAGNOSIS — E119 Type 2 diabetes mellitus without complications: Secondary | ICD-10-CM | POA: Diagnosis not present

## 2023-11-02 DIAGNOSIS — M150 Primary generalized (osteo)arthritis: Secondary | ICD-10-CM

## 2023-11-02 DIAGNOSIS — F0280 Dementia in other diseases classified elsewhere without behavioral disturbance: Secondary | ICD-10-CM

## 2023-11-02 DIAGNOSIS — N400 Enlarged prostate without lower urinary tract symptoms: Secondary | ICD-10-CM | POA: Diagnosis not present

## 2023-11-02 DIAGNOSIS — I2510 Atherosclerotic heart disease of native coronary artery without angina pectoris: Secondary | ICD-10-CM

## 2023-11-02 DIAGNOSIS — C4492 Squamous cell carcinoma of skin, unspecified: Secondary | ICD-10-CM

## 2023-11-02 DIAGNOSIS — G3183 Dementia with Lewy bodies: Secondary | ICD-10-CM

## 2023-11-02 DIAGNOSIS — I10 Essential (primary) hypertension: Secondary | ICD-10-CM | POA: Diagnosis not present

## 2023-11-02 DIAGNOSIS — D509 Iron deficiency anemia, unspecified: Secondary | ICD-10-CM

## 2023-11-02 DIAGNOSIS — K219 Gastro-esophageal reflux disease without esophagitis: Secondary | ICD-10-CM

## 2023-11-02 NOTE — Assessment & Plan Note (Signed)
 Continues to follow up with [facility]

## 2023-11-02 NOTE — Assessment & Plan Note (Signed)
 Stable, no complaints of chest pain.

## 2023-11-02 NOTE — Assessment & Plan Note (Signed)
Controlled on protonix.   

## 2023-11-02 NOTE — Assessment & Plan Note (Signed)
 Blood pressure well controlled, goal bp <140/90 Continue current medications and dietary modifications follow metabolic panel

## 2023-11-02 NOTE — Assessment & Plan Note (Signed)
 Mild cognitive decline. Continues on exelon 

## 2023-11-02 NOTE — Assessment & Plan Note (Signed)
 Stable. Bilateral knees cause most of the problem continues on voltaren gel to knees and has tylenol  PRN

## 2023-11-02 NOTE — Assessment & Plan Note (Signed)
 Symptoms stable on flomax  and proscar 

## 2023-11-02 NOTE — Assessment & Plan Note (Signed)
Will follow up [facility] 

## 2023-11-02 NOTE — Progress Notes (Signed)
 Location:  Other [facility].  Nursing Home Room Number: Larayne Platter [address] Place of Service:  ALF [id number]) Gilbert Lab, NP  PCP: Valrie Gehrig, MD  Patient Care Team: Valrie Gehrig, MD as PCP - [facility])  Extended Emergency Contact Information Primary Emergency Contact: Biswurm,Suzanne Address: [address]          [place], [place] [address] [place] Home Phone: [phone] Relation: Daughter Secondary Emergency Contact: Bizwurm,Richard Address: [address]          [place], [place] [address] [place] Home Phone: [phone] Relation: Son  Goals of care: Advanced Directive information    07/29/2023    7:05 AM  Advanced Directives  Does Patient Have a Medical Advance Directive? Yes  Type of Advance Directive Out of facility DNR (pink MOST or yellow form)     Chief Complaint  Patient presents with   Medical Management of Chronic Issues    Medical Management of Chronic Issues.     HPI:  Pt is a 89 y.o. male seen today for medical management of chronic disease.  Pt with diabetes, bladder cancer, BPH, Lewy body dementia, and coronary artery disease who presents for a routine follow-up.  He has type 2 diabetes and is concerned about not having had an A1c test recently.   He has a history of bladder cancer and was previously under the care of a urologist but has not seen him recently as he feels there is nothing more that can be done.  He has benign prostatic hyperplasia (BPH) but denies any significant symptoms currently, aside from waking up two to three times at night to urinate. No changes in frequency or flow.  He was diagnosed with Lewy body dementia a few years ago. No changes in memory and has not seen a neurologist recently. Previously had hallucinations of his deceased wife, managed with rivastigmine , and no longer experiences these hallucinations.  He has coronary artery disease but denies any current chest  pain. His blood pressure is well controlled.  He has a history of anemia, with the last blood work in February showing a hemoglobin level of 12.9.   He is concerned about his electrolyte balance and takes potassium when he takes Lasix or when his legs are swollen.  He reports leg weakness and has increased his vitamin D  intake, as he read it could help with weak legs. He also takes a high-protein diet to manage sarcopenia, consuming yogurt at night for its high protein content.  He uses trazodone  and melatonin for sleep, taking two melatonin tablets at night. He also uses Voltaren ointment for knee pain, applying it at night and in the morning. He uses a walker for mobility due to knee instability.  He has a history of bowel obstruction but reports no current issues with bowel movements, denying diarrhea.  He mentions taking turmeric for its anti-inflammatory properties and prefers to purchase it himself.   Past Medical History:  Diagnosis Date   Arthritis    Bilateral renal cysts    Bladder cancer [facility])    urologist-  dr Grady Lawman   BPH (benign prostatic hyperplasia)    Coronary artery disease    hx PCI w/ stenting in 10/ 2014 in [place] --- recently moved from [place]  jan 2018 has not established a cardiologist yet but does have pcp    Dyslipidemia    Essential hypertension    GERD (gastroesophageal reflux disease)    Hematuria  History of gastric ulcer    History of hiatal hernia    History of kidney stones    History of melanoma in situ    several Excision's in situ and malignant melanoma's--  last one 06/ 2017 top of ear excision Stage 1 w/ negative margins/  other have been right upper arm, shoulder, chest, face   History of MI (myocardial infarction)    10/ 2014-- s/p  PCI and stenting x2   History of small bowel obstruction    fall 2017  s/p  abdominal lysis adhesions  (prior hx bowel obstruction total 8 times)   Nocturia    S/P right coronary artery (RCA) stent  placement    10/ 2014  x2   Small bowel obstruction (HCC) 11/07/2016   Type 2 diabetes, diet controlled (HCC)    Wears glasses    Wears hearing aid    bilateral   Past Surgical History:  Procedure Laterality Date   ABDOMINAL HERNIA REPAIR  2012   ADDOMINAL LYSIS ADHESIONS  fall 2017   sbo   CHOLECYSTECTOMY OPEN  1971   and Appendectomy   CORONARY ANGIOPLASTY WITH STENT PLACEMENT  04-17-2013  in [place]    x2 stents to RCA   INGUINAL HERNIA REPAIR Bilateral 1995   TOTAL HIP ARTHROPLASTY Left 2008   TRANSURETHRAL RESECTION OF BLADDER TUMOR  09/2006   LEIOMYOMA    TRANSURETHRAL RESECTION OF BLADDER TUMOR WITH MITOMYCIN -C N/A 09/02/2016   Procedure: TRANSURETHRAL RESECTION OF BLADDER TUMOR WITH MITOMYCIN -C;  Surgeon: Mark Ottelin, MD;  Location: [facility] [place];  Service: Urology;  Laterality: N/A;    Allergies  Allergen Reactions   Fenofibrate     Other reaction(s): Muscle Pain   Losartan  Other (See Comments)    Caused weakness, chest pressure, and shortness of breath    Outpatient Encounter Medications as of 11/02/2023  Medication Sig   acetaminophen  (TYLENOL ) 325 MG tablet Take 650 mg by mouth every 4 (four) hours as needed.   albuterol  (VENTOLIN  HFA) 108 (90 Base) MCG/ACT inhaler Inhale 2 puffs into the lungs every 6 (six) hours as needed for wheezing or shortness of breath.   Ascorbic Acid 500 MG CAPS Take 500 mg by mouth daily.   aspirin  EC 81 MG tablet Take 81 mg by mouth daily.   bismuth subsalicylate (PEPTO BISMOL) 262 MG/15ML suspension Take 30 mLs by mouth every 4 (four) hours as needed.   carbamide peroxide (DEBROX) 6.5 % OTIC solution Place 5 drops into both ears as needed.   cetirizine (ZYRTEC) 10 MG tablet Take 10 mg by mouth daily as needed for allergies.   Cholecalciferol 50 MCG (2000 UT) TABS Take 1 tablet by mouth daily.   Cinnamon  500 MG capsule Take 1,000 mg by mouth in the morning and at bedtime.   Coenzyme Q10 100 MG capsule Take 100 mg by mouth  daily.   cyanocobalamin  1000 MCG tablet Take 1,000 mcg by mouth daily.   diclofenac Sodium (VOLTAREN) 1 % GEL Apply 2 g topically 3 (three) times daily.   diphenhydrAMINE (BENADRYL) 25 mg capsule Take 25 mg by mouth as needed.   finasteride  (PROSCAR ) 5 MG tablet Take 5 mg by mouth every evening.    furosemide (LASIX) 40 MG tablet Take 40 mg by mouth. Every 24 hours as needed for weight gain of 3lbx overnight or 5lbs in week take with potassium   Glucose 15 GM/32ML GEL Take 1 packet by mouth as needed.   guaiFENesin (ROBITUSSIN) 100 MG/5ML  liquid Take 10 mLs by mouth every 4 (four) hours as needed for cough or to loosen phlegm.   ibuprofen (ADVIL) 200 MG tablet Take 600 mg by mouth 2 (two) times daily as needed.   magnesium  hydroxide (MILK OF MAGNESIA) 400 MG/5ML suspension Take 5 mLs by mouth daily as needed for mild constipation.   melatonin (MELATONIN MAXIMUM STRENGTH) 5 MG TABS Take 5 mg by mouth at bedtime as needed.   Multiple Vitamin (MULTIVITAMIN WITH MINERALS) TABS tablet Take 1 tablet by mouth daily.   Multiple Vitamins-Minerals (PRESERVISION AREDS 2 PO) Take 1 tablet by mouth 2 (two) times daily.   naproxen (EC NAPROSYN) 500 MG EC tablet Take 500 mg by mouth as needed.   nystatin (MYCOSTATIN/NYSTOP) powder Apply 1 Application topically 2 (two) times daily as needed.   Omega-3 Fatty Acids (SUPER OMEGA 3 EPA/DHA) 1000 MG CAPS Take 1 capsule by mouth daily. Omega 3 - DHA - EPA - Fish oil-- 1000mg / 120mg / 180mg    ondansetron  (ZOFRAN ) 4 MG tablet Take 4 mg by mouth every 8 (eight) hours as needed for nausea or vomiting.   OXYGEN Inhale into the lungs. 2lpm   pantoprazole  (PROTONIX ) 40 MG tablet Take 1 tablet (40 mg total) by mouth daily.   polyethylene glycol powder (GLYCOLAX/MIRALAX) 17 GM/SCOOP powder Take by mouth daily as needed.   potassium chloride  SA (KLOR-CON  M) 20 MEQ tablet Take 20 mEq by mouth daily.   rivastigmine  (EXELON ) 1.5 MG capsule Take 1.5 mg by mouth 2 (two) times daily.    spironolactone  (ALDACTONE ) 25 MG tablet Take 25 mg by mouth daily.   tamsulosin  (FLOMAX ) 0.4 MG CAPS capsule Take 0.4 mg by mouth every evening.    traZODone  (DESYREL ) 50 MG tablet Take 50 mg by mouth at bedtime.   loperamide (IMODIUM A-D) 2 MG tablet Take 4 mg by mouth as needed for diarrhea or loose stools. (Patient not taking: Reported on 11/02/2023)   metoprolol  succinate (TOPROL -XL) 50 MG 24 hr tablet Take 1 tablet (50 mg total) by mouth daily. (Patient not taking: Reported on 11/02/2023)   Wheat Dextrin (BENEFIBER PO) Give 2 tsp by mouth once daily. (Patient not taking: Reported on 11/02/2023)   Zinc Oxide (TRIPLE PASTE) 12.8 % ointment Apply 1 Application topically. Every shift for redness. (Patient not taking: Reported on 11/02/2023)   No facility-administered encounter medications on file as of 11/02/2023.    Review of Systems  Constitutional:  Negative for chills and fever.  HENT:  Negative for tinnitus.   Respiratory:  Negative for cough and shortness of breath.   Cardiovascular:  Negative for chest pain, palpitations and leg swelling.  Gastrointestinal:  Negative for abdominal pain, constipation and diarrhea.  Genitourinary:  Negative for dysuria, frequency and urgency.  Musculoskeletal:  Negative for back pain and myalgias.  Skin: Negative.   Neurological:  Negative for dizziness and headaches.  Psychiatric/Behavioral:  Negative for agitation and confusion.      Immunization History  Administered Date(s) Administered   Fluzone Influenza virus vaccine,trivalent (IIV3), split virus 03/27/2013, 03/27/2014   H1N1 07/08/2008   Influenza, High Dose Seasonal PF 04/13/2021, 04/04/2022   Influenza-Unspecified 03/27/2000, 03/27/2001, 04/27/2001, 03/27/2002, 03/28/2003, 03/27/2004, 03/27/2005, 04/27/2006, 04/19/2007, 04/09/2008, 03/16/2009, 04/07/2010, 04/12/2011, 04/14/2023   Moderna Sars-Covid-2 Vaccination 07/17/2019, 08/14/2019, 09/11/2019, 06/30/2020, 03/19/2021   PPD Test 06/02/2020    Pneumococcal Conjugate,unspecified 06/27/2013   Pneumococcal Conjugate-13 03/27/2001   Pneumococcal-Unspecified 04/28/1999, 03/27/2001, 04/11/2011   Zoster, Live 03/27/2012   Pertinent  Health Maintenance Due  Topic Date Due  OPHTHALMOLOGY EXAM  Never done   FOOT EXAM  10/29/2019   HEMOGLOBIN A1C  06/02/2023   INFLUENZA VACCINE  01/26/2024      07/03/2020    1:13 AM 07/03/2020    9:00 AM 07/03/2020    9:05 PM 01/14/2021    9:22 PM 07/22/2021    4:39 PM  Fall Risk  (RETIRED) Patient Fall Risk Level High fall risk High fall risk High fall risk Low fall risk Moderate fall risk   Functional Status Survey:    Vitals:   11/02/23 0932 11/02/23 0955  BP: (!) 157/72 134/67  Pulse: 71   Resp: 16   Temp: 98 F (36.7 C)   SpO2: 96%   Weight: 206 lb (93.4 kg)   Height: 5\' 11"  (1.803 m)    Body mass index is 28.73 kg/m. Physical Exam Constitutional:      General: He is not in acute distress.    Appearance: He is well-developed. He is not diaphoretic.  HENT:     Head: Normocephalic and atraumatic.     Right Ear: External ear normal.     Left Ear: External ear normal.     Mouth/Throat:     Pharynx: No oropharyngeal exudate.  Eyes:     Conjunctiva/sclera: Conjunctivae normal.     Pupils: Pupils are equal, round, and reactive to light.  Cardiovascular:     Rate and Rhythm: Normal rate and regular rhythm.     Heart sounds: Murmur heard.  Pulmonary:     Effort: Pulmonary effort is normal.     Breath sounds: Normal breath sounds.  Abdominal:     General: Bowel sounds are normal.     Palpations: Abdomen is soft.  Musculoskeletal:        General: No tenderness.     Cervical back: Normal range of motion and neck supple.     Right lower leg: Edema present.     Left lower leg: Edema present.  Skin:    General: Skin is warm and dry.  Neurological:     Mental Status: He is alert and oriented to person, place, and time.     Motor: Weakness present.     Gait: Gait abnormal.      Labs reviewed: Recent Labs    12/28/22 0951 12/29/22 0641 12/30/22 0445 06/11/23 2202 06/15/23 0626 06/16/23 0532 06/26/23 0000 07/29/23 0705  NA 131* 130* 133*   < > 132* 135 134* 137  K 3.8 3.5 3.7   < > 3.1* 3.7 4.2 4.0  CL 101 100 100   < > 96* 99 97* 99  CO2 23 23 25    < > 24 28 29* 29  GLUCOSE 158* 124* 119*   < > 163* 122*  --  127*  BUN 14 11 10    < > 13 15 16 12   CREATININE 0.72 0.80 0.81   < > 0.98 0.72 0.8 0.79  CALCIUM  7.9* 7.9* 8.2*   < > 8.5* 8.3* 8.8 8.6*  MG 2.1 1.9 1.9  --  1.8  --   --   --   PHOS 2.4* 2.5 2.8  --   --   --   --   --    < > = values in this interval not displayed.   Recent Labs    12/01/22 0000 12/26/22 0200 06/11/23 2202  AST 12* 18 19  ALT 8* 13 13  ALKPHOS  --  63 76  BILITOT  --  0.4 0.9  PROT  --  6.7 7.3  ALBUMIN 3.3* 3.3* 3.8   Recent Labs    12/26/22 0200 12/27/22 0414 06/15/23 0626 06/16/23 0532 06/26/23 0000 07/29/23 0705  WBC 11.4*   < > 11.8* 7.7 8.1 5.2  NEUTROABS 7.9*  --   --   --  5,540.00 2.9  HGB 11.4*   < > 13.6 11.6* 13.0* 12.9*  HCT 36.3*   < > 40.0 34.0* 39* 39.4  MCV 78.9*   < > 89.3 88.8  --  91.2  PLT 337   < > 263 258 352 238   < > = values in this interval not displayed.   Lab Results  Component Value Date   TSH 1.567 10/07/2016   Lab Results  Component Value Date   HGBA1C 6.9 12/01/2022   Lab Results  Component Value Date   CHOL 151 12/01/2022   HDL 30 (A) 12/01/2022   LDLCALC 88 12/01/2022   TRIG 254 (A) 12/01/2022    Significant Diagnostic Results in last 30 days:  No results found.  Assessment/Plan Squamous cell carcinoma of skin Continues to follow up with [facility]  Primary osteoarthritis involving multiple joints Stable. Bilateral knees cause most of the problem continues on voltaren gel to knees and has tylenol  PRN   Lewy body dementia without behavioral disturbance (HCC) Mild cognitive decline. Continues on exelon   Iron  deficiency anemia Will  follow up cbc   GERD without esophagitis Controlled on protonix    Essential hypertension Blood pressure well controlled, goal bp <140/90 Continue current medications and dietary modifications follow metabolic panel  Diabetes mellitus type 2, diet-controlled (HCC) Will follow up a1c  Coronary artery disease involving native coronary artery of native heart without angina pectoris Stable, no complaints of chest pain.   BPH (benign prostatic hyperplasia) Symptoms stable on flomax  and proscar      Kayden Hutmacher K. Denney Fisherman [facility] [phone]

## 2023-11-02 NOTE — Assessment & Plan Note (Signed)
Will follow up a1c

## 2023-11-06 LAB — CBC AND DIFFERENTIAL
HCT: 35 — AB (ref 41–53)
Hemoglobin: 11.5 — AB (ref 13.5–17.5)
Neutrophils Absolute: 4257
Platelets: 272 K/uL (ref 150–400)
WBC: 6.9

## 2023-11-06 LAB — CBC: RBC: 4.4 (ref 3.87–5.11)

## 2023-11-06 LAB — BASIC METABOLIC PANEL WITH GFR
CO2: 31 — AB (ref 13–22)
Chloride: 97 — AB (ref 99–108)
Potassium: 4.3 meq/L (ref 3.5–5.1)
Sodium: 134 — AB (ref 137–147)

## 2023-11-06 LAB — HEMOGLOBIN A1C: Hemoglobin A1C: 7.1

## 2023-11-06 LAB — COMPREHENSIVE METABOLIC PANEL WITH GFR
Albumin: 3.3 — AB (ref 3.5–5.0)
Calcium: 8.5 — AB (ref 8.7–10.7)
Globulin: 3
eGFR: 68

## 2023-11-06 LAB — HEPATIC FUNCTION PANEL
ALT: 10 U/L (ref 10–40)
AST: 14 (ref 14–40)
Alkaline Phosphatase: 80 (ref 25–125)
Bilirubin, Total: 0.4

## 2023-11-13 ENCOUNTER — Other Ambulatory Visit: Payer: Self-pay | Admitting: Student

## 2023-11-13 DIAGNOSIS — S63289S Dislocation of proximal interphalangeal joint of unspecified finger, sequela: Secondary | ICD-10-CM

## 2023-11-13 NOTE — Progress Notes (Signed)
 Patient with longstanding dislocation non operable, now forming a small pressure wound.

## 2023-11-23 ENCOUNTER — Non-Acute Institutional Stay: Payer: Self-pay | Admitting: Nurse Practitioner

## 2023-11-23 DIAGNOSIS — Z0000 Encounter for general adult medical examination without abnormal findings: Secondary | ICD-10-CM | POA: Diagnosis not present

## 2023-11-23 NOTE — Progress Notes (Signed)
 Subjective:   Tyler Pena is a 89 y.o. male who presents for Medicare Annual/Subsequent preventive examination.  Visit Complete: In person [place]   Cardiac Risk Factors include: advanced age (>19men, >31 women);dyslipidemia;hypertension;sedentary lifestyle     Objective:     Today's Vitals   11/23/23 1452  BP: (!) 144/80  Pulse: 77  Resp: 20  Temp: (!) 97.4 F (36.3 C)  SpO2: 93%  Weight: 206 lb (93.4 kg)   Body mass index is 28.73 kg/m.     07/29/2023    7:05 AM 07/04/2023   11:30 AM 06/30/2023   10:49 AM 06/19/2023    9:45 AM 06/12/2023    7:00 PM 06/11/2023   10:01 PM 12/26/2022    7:04 AM  Advanced Directives  Does Patient Have a Medical Advance Directive? Yes Yes Yes Yes  Yes Yes  Type of Advance Directive Out of facility DNR (pink MOST or yellow form) Out of facility DNR (pink MOST or yellow form) Out of facility DNR (pink MOST or yellow form) Out of facility DNR (pink MOST or yellow form) Out of facility DNR (pink MOST or yellow form) Out of facility DNR (pink MOST or yellow form) Out of facility DNR (pink MOST or yellow form)  Does patient want to make changes to medical advance directive?  No - Patient declined No - Patient declined No - Patient declined   No - Patient declined  Pre-existing out of facility DNR order (yellow form or pink MOST form)    Yellow form placed in chart (order not valid for inpatient use)   Pink Most/Yellow Form available - Physician notified to receive inpatient order    Current Medications (verified) Outpatient Encounter Medications as of 11/23/2023  Medication Sig   acetaminophen  (TYLENOL ) 325 MG tablet Take 650 mg by mouth every 4 (four) hours as needed.   albuterol  (VENTOLIN  HFA) 108 (90 Base) MCG/ACT inhaler Inhale 2 puffs into the lungs every 6 (six) hours as needed for wheezing or shortness of breath.   Ascorbic Acid 500 MG CAPS Take 500 mg by mouth daily.   aspirin  EC 81 MG tablet Take 81 mg by mouth daily.   bismuth  subsalicylate (PEPTO BISMOL) 262 MG/15ML suspension Take 30 mLs by mouth every 4 (four) hours as needed.   carbamide peroxide (DEBROX) 6.5 % OTIC solution Place 5 drops into both ears as needed.   cetirizine (ZYRTEC) 10 MG tablet Take 10 mg by mouth daily as needed for allergies.   Cholecalciferol 50 MCG (2000 UT) TABS Take 1 tablet by mouth daily.   Cinnamon  500 MG capsule Take 1,000 mg by mouth in the morning and at bedtime.   Coenzyme Q10 100 MG capsule Take 100 mg by mouth daily.   cyanocobalamin  1000 MCG tablet Take 1,000 mcg by mouth daily.   diclofenac Sodium (VOLTAREN) 1 % GEL Apply 2 g topically 3 (three) times daily.   diphenhydrAMINE (BENADRYL) 25 mg capsule Take 25 mg by mouth as needed.   finasteride  (PROSCAR ) 5 MG tablet Take 5 mg by mouth every evening.    furosemide (LASIX) 40 MG tablet Take 40 mg by mouth. Every 24 hours as needed for weight gain of 3lbx overnight or 5lbs in week take with potassium   Glucose 15 GM/32ML GEL Take 1 packet by mouth as needed.   guaiFENesin (ROBITUSSIN) 100 MG/5ML liquid Take 10 mLs by mouth every 4 (four) hours as needed for cough or to loosen phlegm.   ibuprofen (ADVIL)  200 MG tablet Take 600 mg by mouth 2 (two) times daily as needed.   magnesium  hydroxide (MILK OF MAGNESIA) 400 MG/5ML suspension Take 5 mLs by mouth daily as needed for mild constipation.   melatonin (MELATONIN MAXIMUM STRENGTH) 5 MG TABS Take 5 mg by mouth at bedtime as needed.   Multiple Vitamin (MULTIVITAMIN WITH MINERALS) TABS tablet Take 1 tablet by mouth daily.   Multiple Vitamins-Minerals (PRESERVISION AREDS 2 PO) Take 1 tablet by mouth 2 (two) times daily.   naproxen (EC NAPROSYN) 500 MG EC tablet Take 500 mg by mouth as needed.   nystatin (MYCOSTATIN/NYSTOP) powder Apply 1 Application topically 2 (two) times daily as needed.   Omega-3 Fatty Acids (SUPER OMEGA 3 EPA/DHA) 1000 MG CAPS Take 1 capsule by mouth daily. Omega 3 - DHA - EPA - Fish oil-- 1000mg / 120mg / 180mg     ondansetron  (ZOFRAN ) 4 MG tablet Take 4 mg by mouth every 8 (eight) hours as needed for nausea or vomiting.   OXYGEN Inhale into the lungs. 2lpm   pantoprazole  (PROTONIX ) 40 MG tablet Take 1 tablet (40 mg total) by mouth daily.   polyethylene glycol powder (GLYCOLAX/MIRALAX) 17 GM/SCOOP powder Take by mouth daily as needed.   potassium chloride  SA (KLOR-CON  M) 20 MEQ tablet Take 20 mEq by mouth daily.   rivastigmine  (EXELON ) 1.5 MG capsule Take 1.5 mg by mouth 2 (two) times daily.   spironolactone  (ALDACTONE ) 25 MG tablet Take 25 mg by mouth daily.   tamsulosin  (FLOMAX ) 0.4 MG CAPS capsule Take 0.4 mg by mouth every evening.    traZODone  (DESYREL ) 50 MG tablet Take 50 mg by mouth at bedtime.   [DISCONTINUED] loperamide (IMODIUM A-D) 2 MG tablet Take 4 mg by mouth as needed for diarrhea or loose stools. (Patient not taking: Reported on 11/02/2023)   [DISCONTINUED] metoprolol  succinate (TOPROL -XL) 50 MG 24 hr tablet Take 1 tablet (50 mg total) by mouth daily. (Patient not taking: Reported on 11/02/2023)   [DISCONTINUED] Wheat Dextrin (BENEFIBER PO) Give 2 tsp by mouth once daily. (Patient not taking: Reported on 11/02/2023)   [DISCONTINUED] Zinc Oxide (TRIPLE PASTE) 12.8 % ointment Apply 1 Application topically. Every shift for redness. (Patient not taking: Reported on 11/02/2023)   No facility-administered encounter medications on file as of 11/23/2023.    Allergies (verified) Fenofibrate and Losartan    History: Past Medical History:  Diagnosis Date   Arthritis    Bilateral renal cysts    Bladder cancer [facility])    urologist-  dr Grady Lawman   BPH (benign prostatic hyperplasia)    Coronary artery disease    hx PCI w/ stenting in 10/ 2014 in [place] --- recently moved from [place]  jan 2018 has not established a cardiologist yet but does have pcp    Dyslipidemia    Essential hypertension    GERD (gastroesophageal reflux disease)    Hematuria    History of gastric ulcer    History of hiatal  hernia    History of kidney stones    History of melanoma in situ    several Excision's in situ and malignant melanoma's--  last one 06/ 2017 top of ear excision Stage 1 w/ negative margins/  other have been right upper arm, shoulder, chest, face   History of MI (myocardial infarction)    10/ 2014-- s/p  PCI and stenting x2   History of small bowel obstruction    fall 2017  s/p  abdominal lysis adhesions  (prior hx bowel obstruction total 8 times)  Nocturia    S/P right coronary artery (RCA) stent placement    10/ 2014  x2   Small bowel obstruction (HCC) 11/07/2016   Type 2 diabetes, diet controlled (HCC)    Wears glasses    Wears hearing aid    bilateral   Past Surgical History:  Procedure Laterality Date   ABDOMINAL HERNIA REPAIR  2012   ADDOMINAL LYSIS ADHESIONS  fall 2017   sbo   CHOLECYSTECTOMY OPEN  1971   and Appendectomy   CORONARY ANGIOPLASTY WITH STENT PLACEMENT  04-17-2013  in [place]    x2 stents to RCA   INGUINAL HERNIA REPAIR Bilateral 1995   TOTAL HIP ARTHROPLASTY Left 2008   TRANSURETHRAL RESECTION OF BLADDER TUMOR  09/2006   LEIOMYOMA    TRANSURETHRAL RESECTION OF BLADDER TUMOR WITH MITOMYCIN -C N/A 09/02/2016   Procedure: TRANSURETHRAL RESECTION OF BLADDER TUMOR WITH MITOMYCIN -C;  Surgeon: Mark Ottelin, MD;  Location: [facility];  Service: Urology;  Laterality: N/A;   Family History  Problem Relation Age of Onset   Cancer Mother    CAD Father    CAD Sister    CAD Sister    CAD Sister    Social History   Socioeconomic History   Marital status: Married    Spouse name: Not on file   Number of children: Not on file   Years of education: Not on file   Highest education level: Not on file  Occupational History   Occupation: retired physician  Tobacco Use   Smoking status: Never   Smokeless tobacco: Never  Vaping Use   Vaping status: Never Used  Substance and Sexual Activity   Alcohol use: Yes    Alcohol/week: 1.0 standard drink of  alcohol    Types: 1 Glasses of wine per week    Comment: occasional   Drug use: No   Sexual activity: Not Currently  Other Topics Concern   Not on file  Social History Narrative   Not on file   Social Drivers of Health   Financial Resource Strain: Low Risk  (04/07/2023)   [facility]   Overall Financial Resource Strain (CARDIA)    Difficulty of Paying Living Expenses: Not hard at all  Food Insecurity: No Food Insecurity (06/12/2023)   Hunger Vital Sign    Worried About Running Out of Food in the Last Year: Never true    Ran Out of Food in the Last Year: Never true  Transportation Needs: No Transportation Needs (06/12/2023)   PRAPARE - Administrator, Civil Service (Medical): No    Lack of Transportation (Non-Medical): No  Physical Activity: Not on file  Stress: Not on file  Social Connections: Not on file    Tobacco Counseling Counseling given: Not Answered   Clinical Intake:  Pre-visit preparation completed: Yes  Pain : No/denies pain     BMI - recorded: 28 Nutritional Status: BMI 25 -29 Overweight Diabetes: Yes  How often do you need to have someone help you when you read instructions, pamphlets, or other written materials from your doctor or pharmacy?: 1 - Never         Activities of Daily Living    11/23/2023    2:53 PM 11/23/2023    2:51 PM  In your present state of health, do you have any difficulty performing the following activities:  Hearing?  1  Vision?  0  Difficulty concentrating or making decisions? 0   Walking or climbing  stairs? 1   Dressing or bathing? 0   Doing errands, shopping? 0   Preparing Food and eating ? N   Using the Toilet? N   In the past six months, have you accidently leaked urine? Y   Do you have problems with loss of bowel control? N   Managing your Medications? N   Managing your Finances? N   Comment daughter helps   Housekeeping or managing your Housekeeping? N      Patient Care Team: Valrie Gehrig, MD as PCP - General (Family Medicine)  Indicate any recent Medical Services you may have received from other than [facility] providers in the past year (date may be approximate).     Assessment:    This is a routine wellness examination for [facility] [place].  Hearing/Vision screen No results found.   Goals Addressed   None    Depression Screen    11/23/2023    3:01 PM  PHQ 2/9 Scores  PHQ - 2 Score 0    Fall Risk    11/23/2023    3:00 PM  Fall Risk   Falls in the past year? 1  Number falls in past yr: 0  Injury with Fall? 0  Risk for fall due to : History of fall(s);Impaired balance/gait;Impaired mobility  Follow up Falls evaluation completed    MEDICARE RISK AT HOME: Medicare Risk at Home Any stairs in or around the home?: Yes If so, are there any without handrails?: No Home free of loose throw rugs in walkways, pet beds, electrical cords, etc?: Yes Adequate lighting in your home to reduce risk of falls?: Yes Life alert?: Yes Use of a cane, walker or w/c?: Yes Grab bars in the bathroom?: Yes Shower chair or bench in shower?: Yes Elevated toilet seat or a handicapped toilet?: Yes  TIMED UP AND GO:  Was the test performed?  No    Cognitive Function:        Immunizations Immunization History  Administered Date(s) Administered   Fluzone Influenza virus vaccine,trivalent (IIV3), split virus 03/27/2013, 03/27/2014   H1N1 07/08/2008   Influenza, High Dose Seasonal PF 04/13/2021, 04/04/2022   Influenza-Unspecified 03/27/2000, 03/27/2001, 04/27/2001, 03/27/2002, 03/28/2003, 03/27/2004, 03/27/2005, 04/27/2006, 04/19/2007, 04/09/2008, 03/16/2009, 04/07/2010, 04/12/2011, 04/14/2023   Moderna Sars-Covid-2 Vaccination 07/17/2019, 08/14/2019, 09/11/2019, 06/30/2020, 03/19/2021   PNEUMOCOCCAL CONJUGATE-20 06/27/2021   PPD Test 06/02/2020   Pneumococcal Conjugate,unspecified 06/27/2013   Pneumococcal Conjugate-13 03/27/2001    Pneumococcal-Unspecified 04/28/1999, 03/27/2001, 04/11/2011   Zoster, Live 03/27/2012    TDAP status: Due, Education has been provided regarding the importance of this vaccine. Advised may receive this vaccine at local pharmacy or Health Dept. Aware to provide a copy of the vaccination record if obtained from local pharmacy or Health Dept. Verbalized acceptance and understanding.  Flu Vaccine status: Up to date  Pneumococcal vaccine status: Up to date  Covid-19 vaccine status: Declined, Education has been provided regarding the importance of this vaccine but patient still declined. Advised may receive this vaccine at local pharmacy or Health Dept.or vaccine clinic. Aware to provide a copy of the vaccination record if obtained from local pharmacy or Health Dept. Verbalized acceptance and understanding.  Qualifies for Shingles Vaccine? Yes   Zostavax completed Yes   Shingrix Completed?: Yes  Screening Tests Health Maintenance  Topic Date Due   OPHTHALMOLOGY EXAM  Never done   Zoster Vaccines- Shingrix (1 of 2) 03/24/1940   COVID-19 Vaccine (6 - 2024-25 season) 02/26/2023   HEMOGLOBIN A1C  06/02/2023   DTaP/Tdap/Td (1 -  Tdap) 11/22/2024 (Originally 03/24/1940)   INFLUENZA VACCINE  01/26/2024   FOOT EXAM  11/22/2024   Medicare Annual Wellness (AWV)  11/22/2024   Pneumonia Vaccine 65+ Years old  Completed   HPV VACCINES  Aged Out   Meningococcal B Vaccine  Aged Out    Health Maintenance  Health Maintenance Due  Topic Date Due   OPHTHALMOLOGY EXAM  Never done   Zoster Vaccines- Shingrix (1 of 2) 03/24/1940   COVID-19 Vaccine (6 - 2024-25 season) 02/26/2023   HEMOGLOBIN A1C  06/02/2023    Colorectal cancer screening: No longer required.   Lung Cancer Screening: (Low Dose CT Chest recommended if Age 51-80 years, 20 pack-year currently smoking OR have quit w/in 15years.) does not qualify.   Lung Cancer Screening Referral: na  Additional Screening:  Hepatitis C Screening: does  not qualify  Vision Screening: Recommended annual ophthalmology exams for early detection of glaucoma and other disorders of the eye. Is the patient up to date with their annual eye exam?  Yes  Who is the provider or what is the name of the office in which the patient attends [facility] exams? [facility] If pt is not established with a provider, would they like to be referred to a provider to establish care? No .   Dental Screening: Recommended annual dental exams for proper oral hygiene  Diabetic Foot Exam: Diabetic Foot Exam: Completed 11/23/2023  Community Resource Referral / Chronic Care Management: CRR required this visit?  No   CCM required this visit?  No     Plan:     I have personally reviewed and noted the following in the patient's chart:   Medical and social history Use of alcohol, tobacco or illicit drugs  Current medications and supplements including opioid prescriptions. Patient is not currently taking opioid prescriptions. Functional ability and status Nutritional status Physical activity Advanced directives List of other physicians Hospitalizations, surgeries, and ER visits in previous 12 months Vitals Screenings to include cognitive, depression, and falls Referrals and appointments  In addition, I have reviewed and discussed with patient certain preventive protocols, quality metrics, and best practice recommendations. A written personalized care plan for preventive services as well as general preventive health recommendations were provided to patient.     Verma Gobble, NP   11/23/2023

## 2023-11-23 NOTE — Patient Instructions (Signed)
  Mr. Nidiffer , Thank you for taking time to come for your Medicare Wellness Visit. I appreciate your ongoing commitment to your health goals. Please review the following plan we discussed and let me know if I can assist you in the future.   These are the goals we discussed:  Goals   None     This is a list of the screening recommended for you and due dates:  Health Maintenance  Topic Date Due   Eye exam for diabetics  Never done   Zoster (Shingles) Vaccine (1 of 2) 03/24/1940   COVID-19 Vaccine (6 - 2024-25 season) 02/26/2023   Hemoglobin A1C  06/02/2023   DTaP/Tdap/Td vaccine (1 - Tdap) 11/22/2024*   Flu Shot  01/26/2024   Complete foot exam   11/22/2024   Medicare Annual Wellness Visit  11/22/2024   Pneumonia Vaccine  Completed   HPV Vaccine  Aged Out   Meningitis B Vaccine  Aged Out  *Topic was postponed. The date shown is not the original due date.

## 2024-01-08 LAB — HM DIABETES FOOT EXAM

## 2024-01-11 IMAGING — CT CT CERVICAL SPINE W/O CM
2 series · 12 of 27 positions shown, 15 images · non-contrast
Comparison: 07/02/2020

CLINICAL DATA: Fell.  Trauma to the head and neck.



[Series 3: c spine soft · axial · 0.38mm/px · z∈[-255,-129]mm · 7 of 75 slices shown, 9 images]
[im 6/75  soft-tissue]
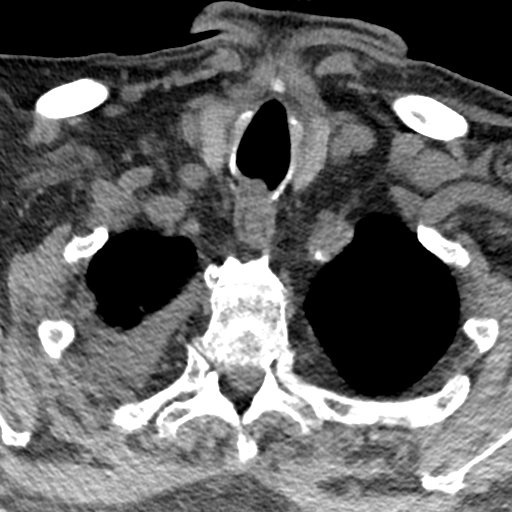
[im 6/75  bone]
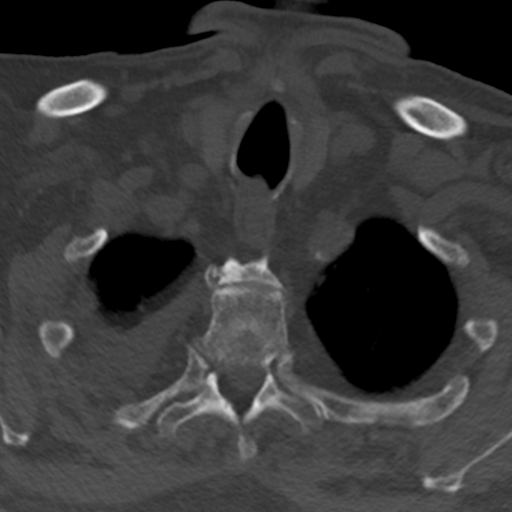
[im 18/75  bone]
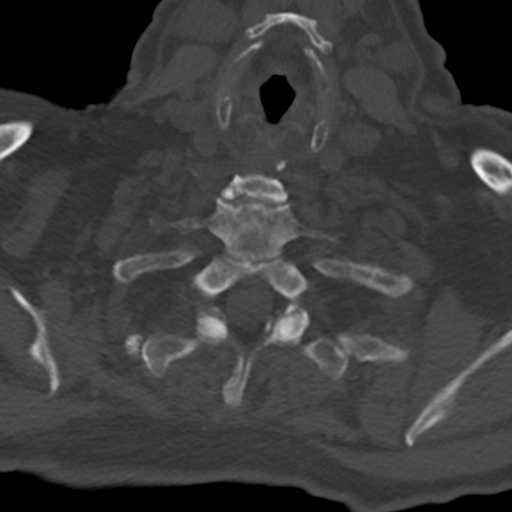
[im 29/75  bone]
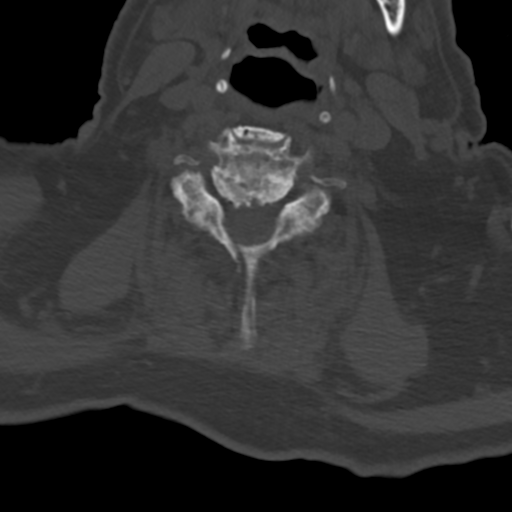
[im 40/75  bone]
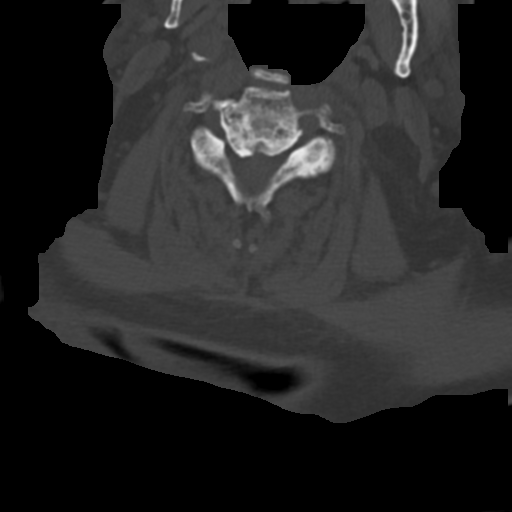
[im 46/75  soft-tissue]
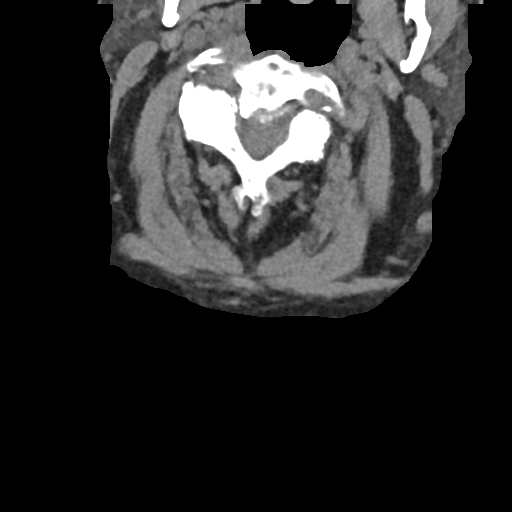
[im 46/75  bone]
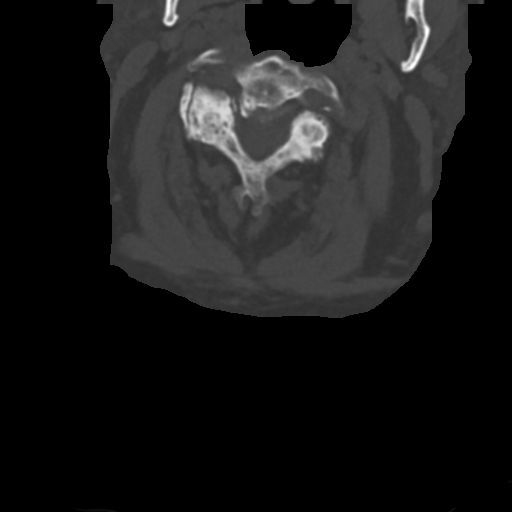
[im 57/75  bone]
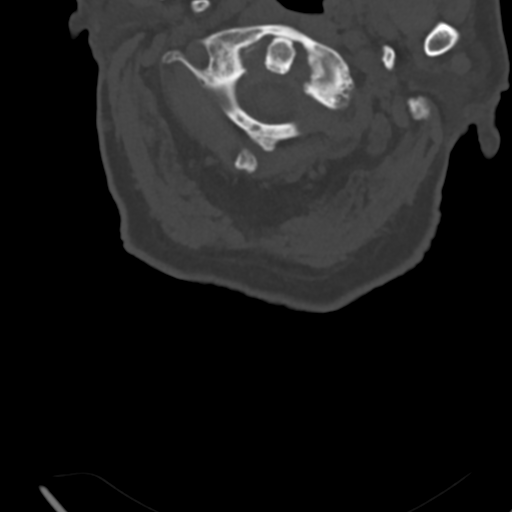
[im 69/75  bone]
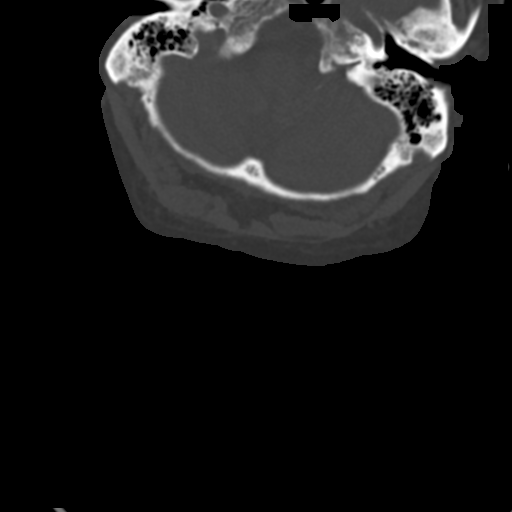

[Series 6: sagittal bone · sagittal · 0.26mm/px · 5 of 41 slices shown, 6 images]
[im 14/41  bone]
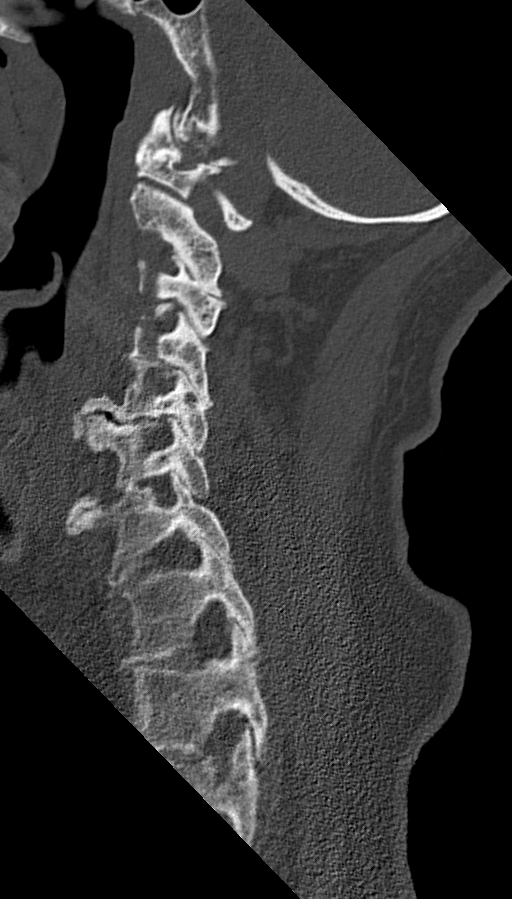
[im 17/41  bone]
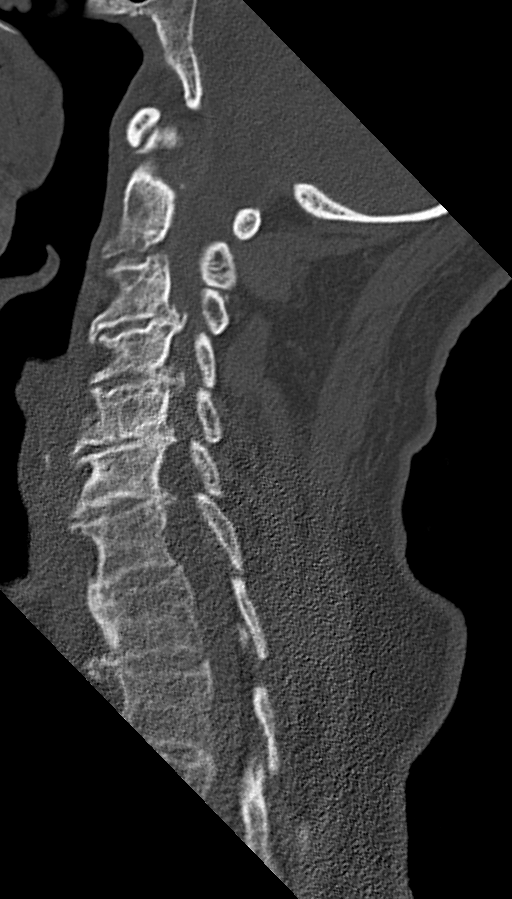
[im 21/41  soft-tissue]
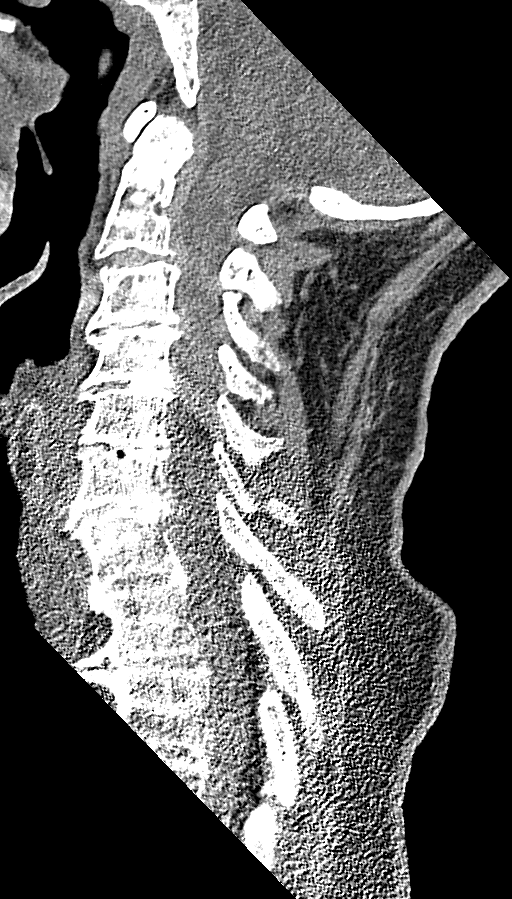
[im 21/41  bone]
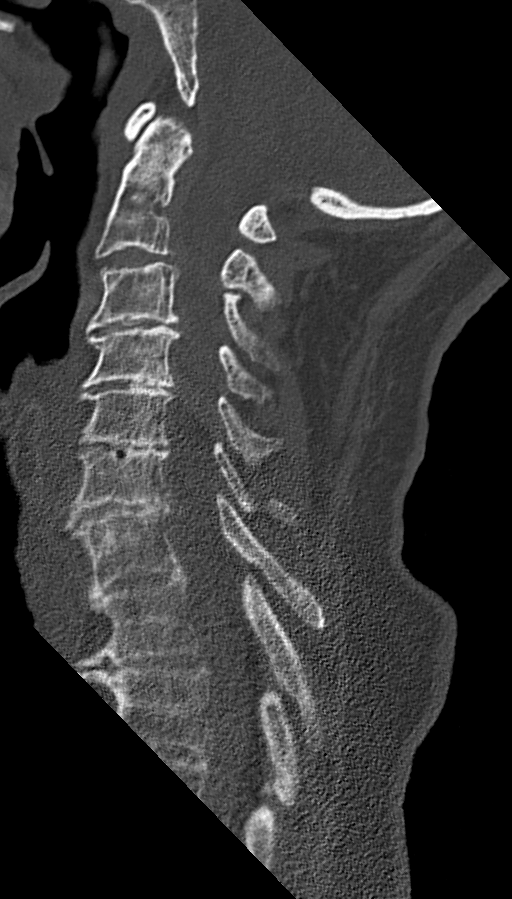
[im 24/41  bone]
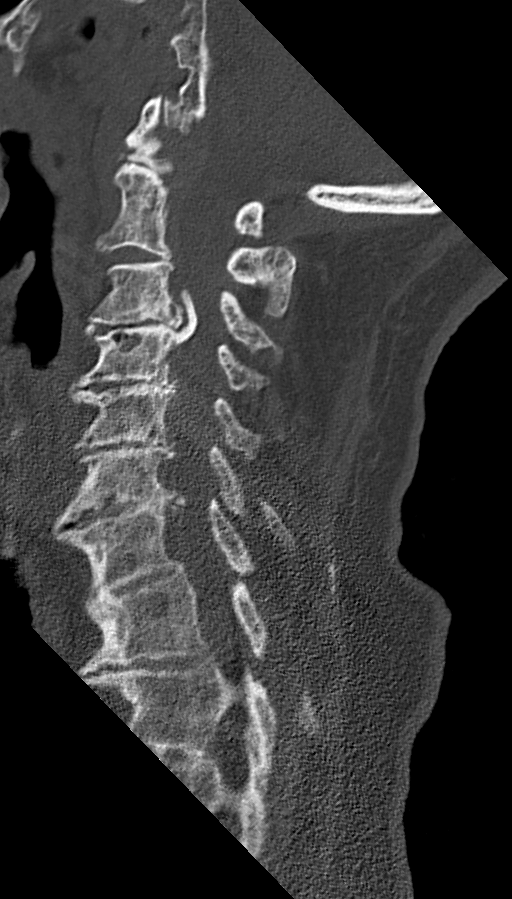
[im 27/41  bone]
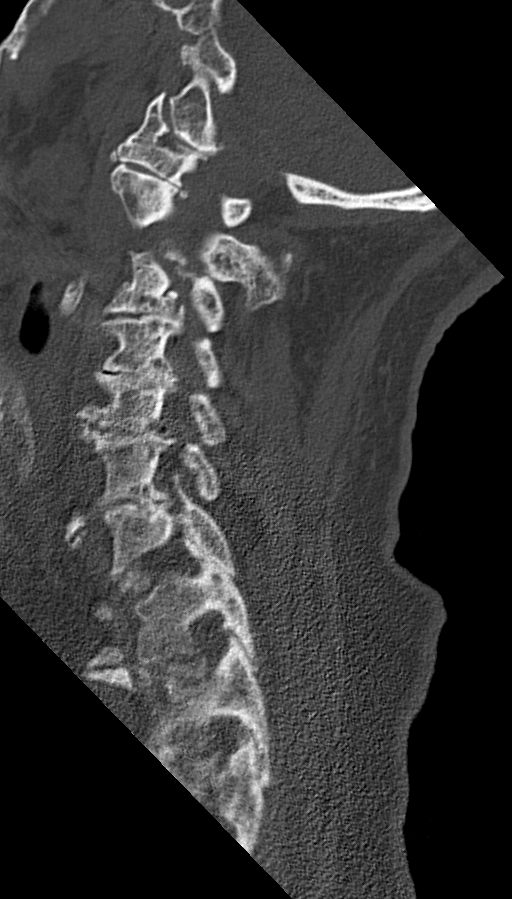

[12 of 27 positions shown; findings below may reference images not displayed]

FINDINGS: Alignment: Mild scoliosis convex to the right. No traumatic
malalignment.

Skull base and vertebrae: No fracture or focal bone lesion.

Soft tissues and spinal canal: No traumatic soft tissue finding.

Disc levels: Degenerative spondylosis throughout the cervical spine.
At C3-4, there is chronic disc herniation with annular calcification
with canal and foraminal stenosis. Lesser canal stenosis also noted
at C4-5, C5-6 and C6-7. Bilateral foraminal stenosis from C3-4
through C6-7.

Upper chest: Right pleural effusion layering dependently.

Other: None
IMPRESSION: No acute or traumatic cervical finding.

Right pleural effusion layering dependently.

Degenerative spondylosis with foraminal stenosis from C3-4 through
C6-7. Canal stenosis from C3-4 through C6-7, most severe at C3-4.

## 2024-01-11 IMAGING — CT CT HEAD W/O CM
4 series · 15 of 47 positions shown, 17 images · non-contrast
Comparison: 07/02/2020

CLINICAL DATA: Fell with trauma to the head and neck



[Series 3: ax head wo · axial · 0.37mm/px · z∈[-129,-19]mm · 7 of 30 slices shown, 9 images]
[im 4/30  brain]
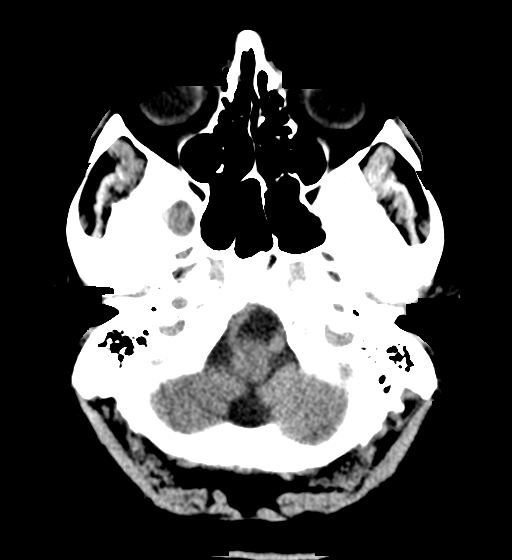
[im 4/30  bone]
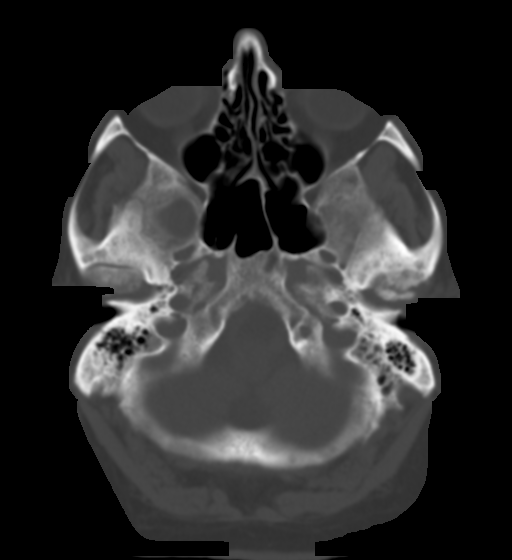
[im 8/30  brain]
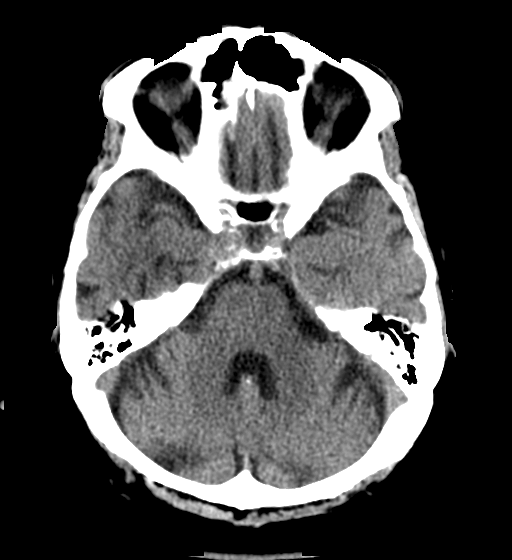
[im 11/30  brain]
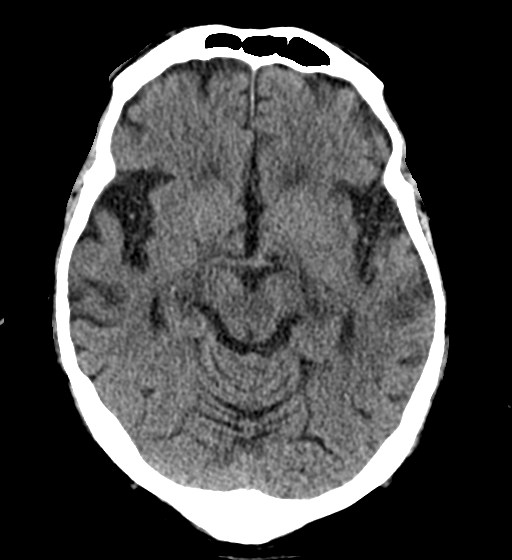
[im 15/30  brain]
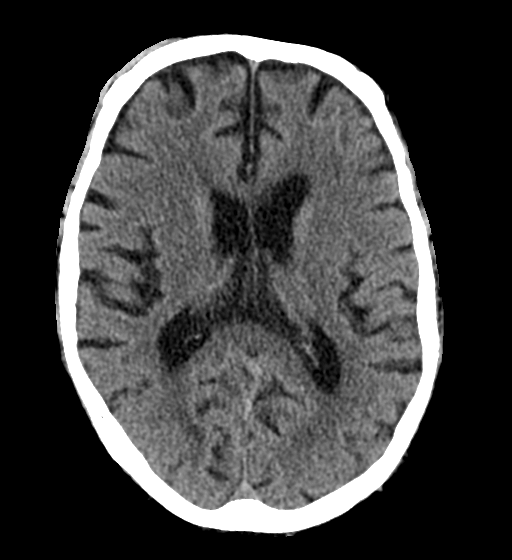
[im 19/30  brain]
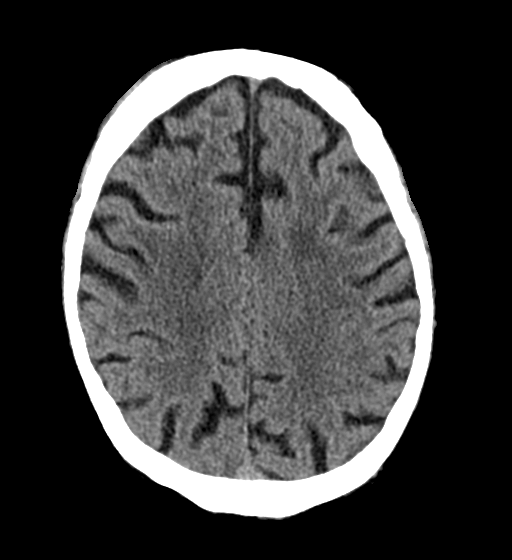
[im 19/30  bone]
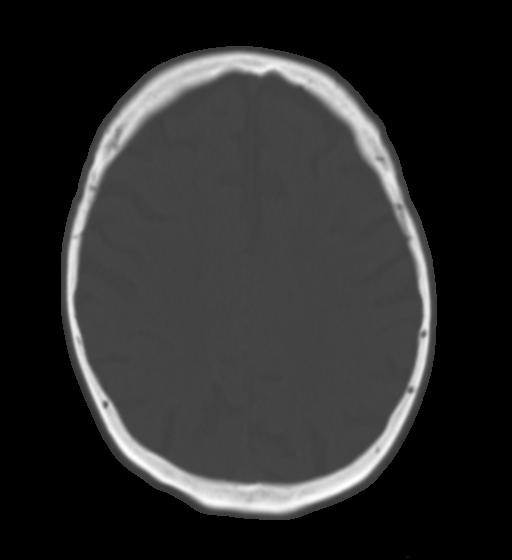
[im 22/30  brain]
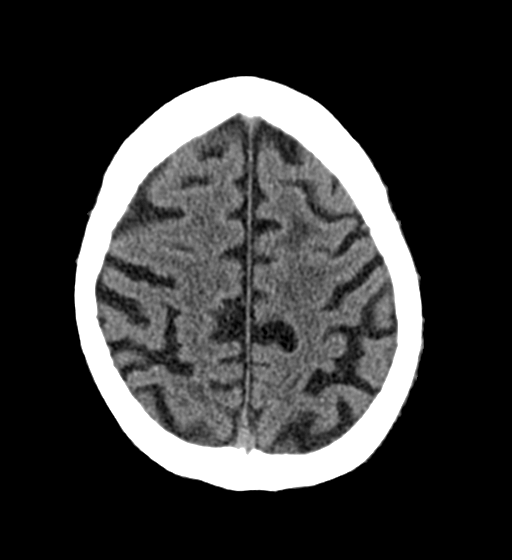
[im 26/30  brain]
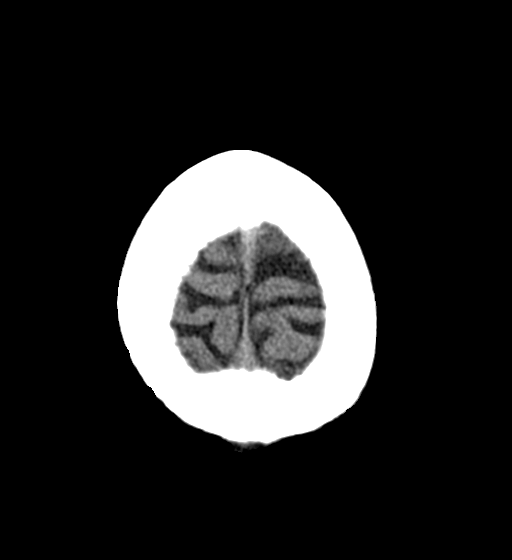

[Series 4: ax head bone · axial · 0.37mm/px · z∈[-130,-116]mm · 2 of 74 slices shown]
[im 8/74  bone]
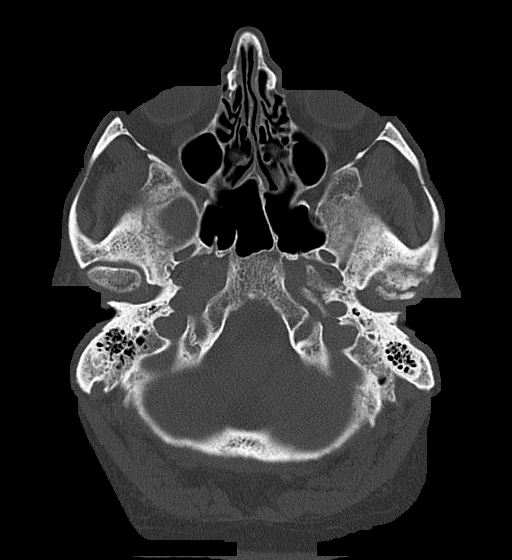
[im 15/74  bone]
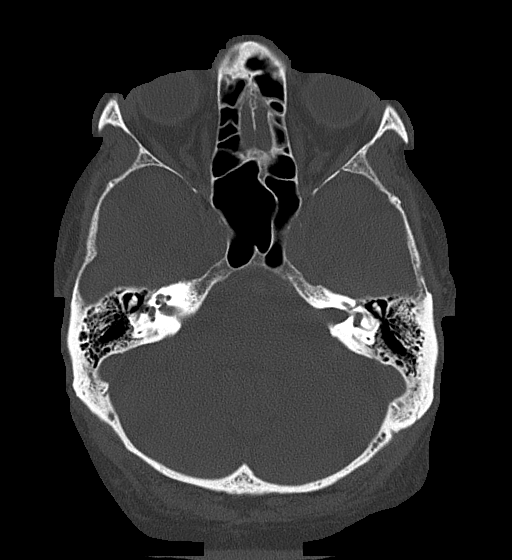

[Series 5: coronal soft tissue · coronal · 0.30mm/px · 3 of 68 slices shown]
[im 23/68  brain]
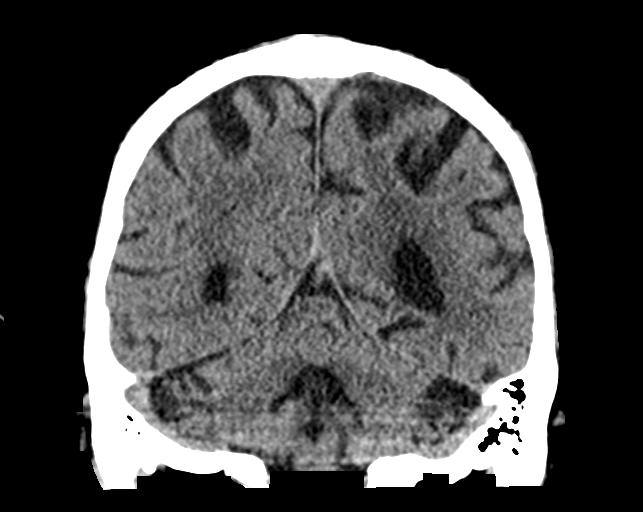
[im 30/68  brain]
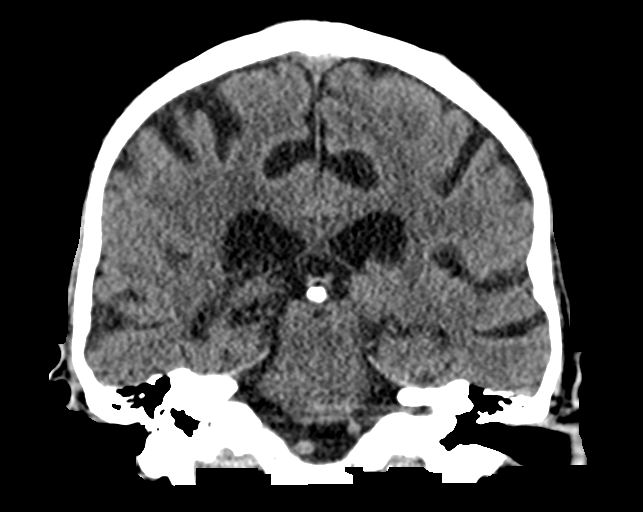
[im 38/68  brain]
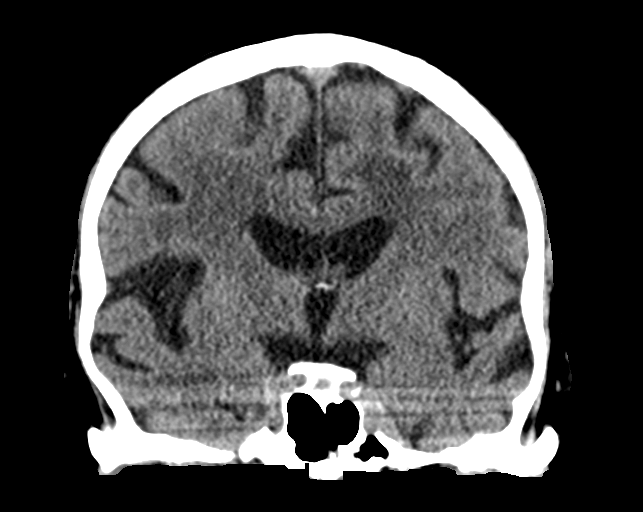

[Series 6: sagittal soft tissue · sagittal · 0.30mm/px · 3 of 56 slices shown]
[im 19/56  brain]
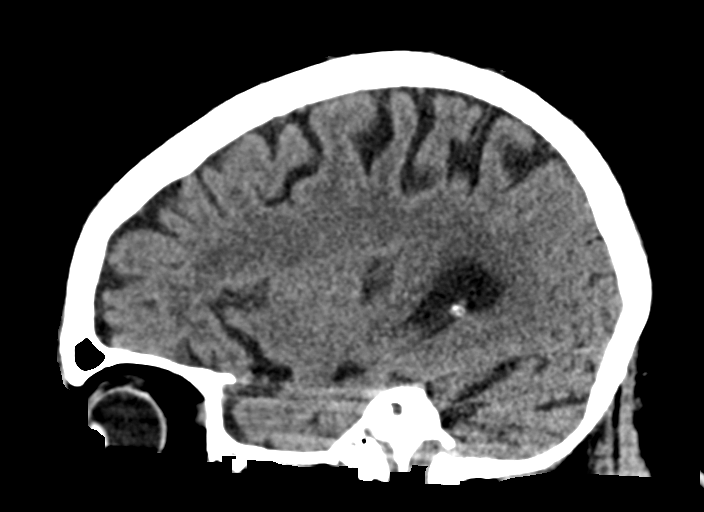
[im 28/56  brain]
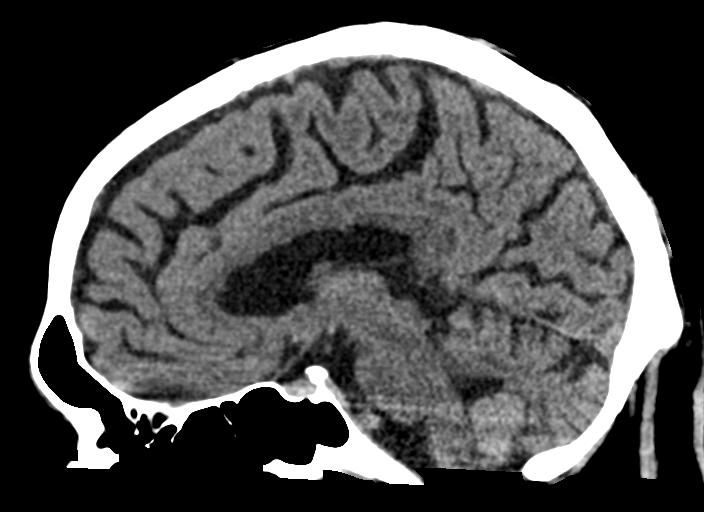
[im 37/56  brain]
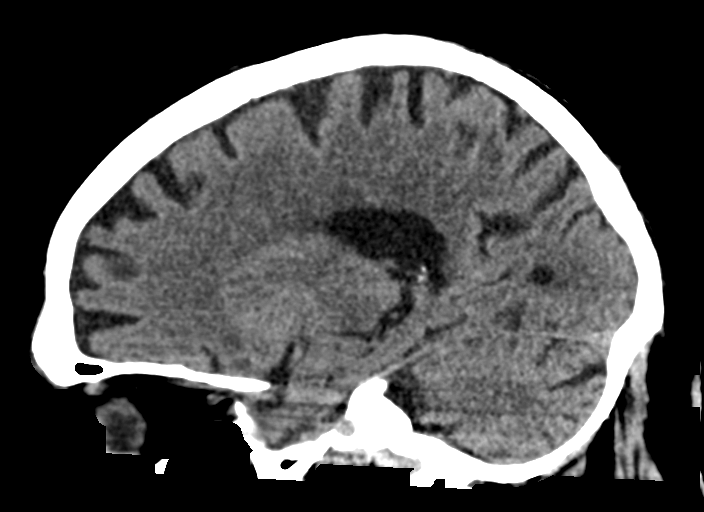

[15 of 47 positions shown; findings below may reference images not displayed]

FINDINGS: Brain: Generalized atrophy. Chronic small-vessel ischemic changes of
the cerebral hemispheric white matter. No acute infarction, mass
lesion, hemorrhage, hydrocephalus or extra-axial collection.

Vascular: There is atherosclerotic calcification of the major
vessels at the base of the brain.

Skull: No skull fracture.

Sinuses/Orbits: Clear/normal

Other: Right frontal scalp hematoma.
IMPRESSION: Right frontal scalp hematoma. No underlying skull fracture. No
intracranial injury. Atrophy and chronic small-vessel ischemic
change of the white matter.

## 2024-01-29 ENCOUNTER — Encounter: Payer: Self-pay | Admitting: Student

## 2024-01-29 ENCOUNTER — Non-Acute Institutional Stay: Payer: Self-pay | Admitting: Student

## 2024-01-29 DIAGNOSIS — E119 Type 2 diabetes mellitus without complications: Secondary | ICD-10-CM | POA: Diagnosis not present

## 2024-01-29 DIAGNOSIS — D508 Other iron deficiency anemias: Secondary | ICD-10-CM

## 2024-01-29 DIAGNOSIS — I2510 Atherosclerotic heart disease of native coronary artery without angina pectoris: Secondary | ICD-10-CM | POA: Diagnosis not present

## 2024-01-29 DIAGNOSIS — N401 Enlarged prostate with lower urinary tract symptoms: Secondary | ICD-10-CM

## 2024-01-29 DIAGNOSIS — G3183 Dementia with Lewy bodies: Secondary | ICD-10-CM

## 2024-01-29 DIAGNOSIS — F0280 Dementia in other diseases classified elsewhere without behavioral disturbance: Secondary | ICD-10-CM

## 2024-01-29 DIAGNOSIS — I10 Essential (primary) hypertension: Secondary | ICD-10-CM | POA: Diagnosis not present

## 2024-01-29 DIAGNOSIS — I440 Atrioventricular block, first degree: Secondary | ICD-10-CM | POA: Diagnosis not present

## 2024-01-29 DIAGNOSIS — R3912 Poor urinary stream: Secondary | ICD-10-CM

## 2024-01-29 DIAGNOSIS — K449 Diaphragmatic hernia without obstruction or gangrene: Secondary | ICD-10-CM

## 2024-01-29 DIAGNOSIS — Z66 Do not resuscitate: Secondary | ICD-10-CM

## 2024-01-29 NOTE — Progress Notes (Unsigned)
 Location:  [facility] Room Number: 45 S Place of Service:  ALF [phone]) Provider:  Abdul Fine, MD  Patient Care Team: Abdul Fine, MD as PCP - General [facility])  Extended Emergency Contact Information Primary Emergency Contact: Biswurm,Suzanne Address: [address]          [place], [place] [address] [place] Home Phone: [phone] Relation: Daughter Secondary Emergency Contact: Bizwurm,Richard Address: [address]          [place], [place] [address] [place] Home Phone: [phone] Relation: Son  Code Status: DNR Goals of care: Advanced Directive information    07/29/2023    7:05 AM  Advanced Directives  Does Patient Have a Medical Advance Directive? Yes  Type of Advance Directive Out of facility DNR (pink MOST or yellow form)     Chief Complaint  Patient presents with   Medical Management of Chronic Issues    Routine visit. Discuss need for shingrix, covid booster, and eye exam     HPI:  Pt is a 89 y.o. male seen today for medical management of chronic diseases.    History of Present Illness The patient is a 89 year old with Lewy body dementia who presents with balance issues and recent falls.  He has been experiencing a sensation of being off balance and not feeling like himself over the past few days. He describes a specific incident where he felt dizzy after applying eye drops and getting up quickly, leading to a fall onto the bed. He has had a couple of falls recently and describes a sensation of being 'thrown down' rather than simply falling. His balance issues are known to worsen when he is ill, such as during a recent bout of the flu.  He has a history of Lewy body dementia, diagnosed after experiencing hallucinations and falls. He reports seeing his deceased wife standing in a doorway, which prompted a neurological evaluation. Diagnostic workup included an EEG and review of an old brain x-ray.  The patient reports that the neurologist told him these tests showed changes and led to the diagnosis of Lewy body dementia. He has undergone physical therapy in the past, primarily focusing on balance.  He recently had dental work done, which has improved his ability to eat meat. He is scheduled for a follow-up dental appointment to check his gums and possibly undergo a root canal. He has been on penicillin for a sensitive area in his mouth.  He has a history of bladder cancer, treated with cauterization and intravesical methotrexate. No current symptoms of hematuria but mentions some urinary stream changes due to scar tissue from previous procedures.  He experiences skin fragility, with easy bruising and peeling, particularly on his arms. He attributes this to age and has been using a moisturizer and Vaseline for healing. He is a type 2 diabetic and takes cinnamon  with chromium  picolinate to manage his blood sugar, noting that he heals quickly despite his diabetes.  He takes spironolactone  and has recently stopped aspirin  due to bruising concerns. No pitting edema in his legs for the past week, which he finds unusual. He follows a self-imposed high-protein diet, consuming yogurt, eggs, and shrimp to maintain muscle mass and manage sarcopenia.  He lives an active lifestyle, engaging in social activities and maintaining a positive outlook on life. He has a history of a first-degree heart block and notes that his blood pressure and pulse are usually low. He mentions a recent episode of  feeling unwell but is unable to pinpoint a specific cause, suggesting it might be related to his friend's health issues.  Surgical History: - Gallbladder removal: Open surgery due to adhesions from gallstones - Cauterization of bladder tumor: Followed by intravesical methotrexate treatment  Social History - Employment: Mining engineer, Research scientist (medical) for a movie - Living Situation: Resides in a facility, possibly a  retirement community or assisted living, with access to social activities and support. 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 He is socially active, has a close friendship with a person named Zachary, and participates in various activities. The patient has a history of bladder cancer and is proactive about his health, including managing his diet and exercise. He has a supportive family, including a daughter who is knowledgeable in physiology and a grandson in Retail banker. The patient has a positive outlook on life and has had a fulfilling life with various experiences.   Past Medical History:  Diagnosis Date   Arthritis    Bilateral renal cysts    Bladder cancer [facility])    urologist-  dr ceil   BPH (benign prostatic hyperplasia)    Coronary artery disease    hx PCI w/ stenting in 10/ 2014 in [place] --- recently moved from [place]  jan 2018 has not established a cardiologist yet but does have pcp    Dyslipidemia    Essential hypertension    GERD (gastroesophageal reflux disease)    Hematuria    History of gastric ulcer    History of hiatal hernia    History of kidney stones    History of melanoma in situ    several Excision's in situ and malignant melanoma's--  last one 06/ 2017 top of ear excision Stage 1 w/ negative margins/  other have been right upper arm, shoulder, chest, face   History of MI (myocardial infarction)    10/ 2014-- s/p  PCI and stenting x2   History of small bowel obstruction    fall 2017  s/p  abdominal lysis adhesions  (prior hx bowel obstruction total 8 times)   Nocturia    S/P right coronary artery (RCA) stent placement    10/ 2014  x2   Small bowel obstruction (HCC) 11/07/2016   Type 2 diabetes, diet controlled (HCC)    Wears glasses    Wears hearing aid    bilateral   Past Surgical History:  Procedure Laterality Date   ABDOMINAL HERNIA  REPAIR  2012   ADDOMINAL LYSIS ADHESIONS  fall 2017   sbo   CHOLECYSTECTOMY OPEN  1971   and Appendectomy   CORONARY ANGIOPLASTY WITH STENT PLACEMENT  04-17-2013  in [place]    x2 stents to RCA   INGUINAL HERNIA REPAIR Bilateral 1995   TOTAL HIP ARTHROPLASTY Left 2008   TRANSURETHRAL RESECTION OF BLADDER TUMOR  09/2006   LEIOMYOMA    TRANSURETHRAL RESECTION OF BLADDER TUMOR WITH MITOMYCIN -C N/A 09/02/2016   Procedure: TRANSURETHRAL RESECTION OF BLADDER TUMOR WITH MITOMYCIN -C;  Surgeon: Mark Ottelin, MD;  Location: [facility];  Service: Urology;  Laterality: N/A;    Allergies  Allergen Reactions   Fenofibrate     Other reaction(s): Muscle Pain   Losartan  Other (See Comments)    Caused weakness, chest pressure, and shortness of breath    Outpatient Encounter Medications as of 01/29/2024  Medication Sig   acetaminophen  (TYLENOL ) 325  MG tablet Take 650 mg by mouth every 4 (four) hours as needed.   albuterol  (VENTOLIN  HFA) 108 (90 Base) MCG/ACT inhaler Inhale 2 puffs into the lungs every 6 (six) hours as needed for wheezing or shortness of breath.   alum & mag hydroxide-simeth (MAALOX/MYLANTA) 200-200-20 MG/5ML suspension Give 2 Tbsp by mouth every 4 hours as needed for gas, indigestion, or upset stomach supervised self-administration Notify MD if no relief.   amoxicillin -clavulanate (AUGMENTIN) 500-125 MG tablet Take 1 tablet by mouth 2 (two) times daily. X 10 days   Ascorbic Acid 500 MG CAPS Take 500 mg by mouth daily.   bismuth subsalicylate (PEPTO BISMOL) 262 MG/15ML suspension Take 15 mLs by mouth as needed.   carbamide peroxide (DEBROX) 6.5 % OTIC solution Place 5 drops into both ears as needed.   cetirizine (ZYRTEC) 10 MG tablet Take 10 mg by mouth daily as needed for allergies.   Cholecalciferol 50 MCG (2000 UT) TABS Take 1 tablet by mouth daily.   Cinnamon  500 MG capsule Take 1,000 mg by mouth in the morning and at bedtime.   Coenzyme Q10 100 MG capsule Take 100  mg by mouth daily.   cyanocobalamin  1000 MCG tablet Take 1,000 mcg by mouth daily.   diclofenac Sodium (VOLTAREN) 1 % GEL Apply 2 g topically 3 (three) times daily.   diphenhydrAMINE (BENADRYL) 25 mg capsule Take 25 mg by mouth as needed.   finasteride  (PROSCAR ) 5 MG tablet Take 5 mg by mouth every evening.    furosemide (LASIX) 40 MG tablet Take 40 mg by mouth. Every 24 hours as needed for weight gain of 3lbx overnight or 5lbs in week take with potassium   Glucose 15 GM/32ML GEL Take 1 packet by mouth as needed.   guaiFENesin (ROBITUSSIN) 100 MG/5ML liquid Take 10 mLs by mouth every 4 (four) hours as needed for cough or to loosen phlegm.   ibuprofen (ADVIL) 200 MG tablet Take 600 mg by mouth 2 (two) times daily as needed.   magnesium  hydroxide (MILK OF MAGNESIA) 400 MG/5ML suspension Take 5 mLs by mouth daily as needed for mild constipation.   melatonin (MELATONIN MAXIMUM STRENGTH) 5 MG TABS Take 5 mg by mouth at bedtime as needed.   Multiple Vitamin (MULTIVITAMIN WITH MINERALS) TABS tablet Take 1 tablet by mouth daily.   Multiple Vitamins-Minerals (PRESERVISION AREDS 2 PO) Take 1 tablet by mouth 2 (two) times daily.   naproxen (EC NAPROSYN) 500 MG EC tablet Take 500 mg by mouth as needed.   nystatin (MYCOSTATIN/NYSTOP) powder Apply 1 Application topically 2 (two) times daily as needed.   Omega-3 Fatty Acids (SUPER OMEGA 3 EPA/DHA) 1000 MG CAPS Take 1 capsule by mouth daily. Omega 3 - DHA - EPA - Fish oil-- 1000mg / 120mg / 180mg    ondansetron  (ZOFRAN ) 4 MG tablet Take 4 mg by mouth every 8 (eight) hours as needed for nausea or vomiting.   OXYGEN Inhale into the lungs. 2lpm   pantoprazole  (PROTONIX ) 40 MG tablet Take 1 tablet (40 mg total) by mouth daily.   polyethylene glycol powder (GLYCOLAX/MIRALAX) 17 GM/SCOOP powder Take by mouth daily as needed.   potassium chloride  SA (KLOR-CON  M) 20 MEQ tablet Take 20 mEq by mouth daily.   rivastigmine  (EXELON ) 1.5 MG capsule Take 1.5 mg by mouth 2 (two)  times daily.   spironolactone  (ALDACTONE ) 25 MG tablet Take 25 mg by mouth daily.   tamsulosin  (FLOMAX ) 0.4 MG CAPS capsule Take 0.4 mg by mouth every evening.  traZODone  (DESYREL ) 50 MG tablet Take 50 mg by mouth at bedtime.   aspirin  EC 81 MG tablet Take 81 mg by mouth daily. (Patient not taking: Reported on 01/29/2024)   No facility-administered encounter medications on file as of 01/29/2024.    Review of Systems  Immunization History  Administered Date(s) Administered   Fluzone Influenza virus vaccine,trivalent (IIV3), split virus 03/27/2013, 03/27/2014   H1N1 07/08/2008   Influenza, High Dose Seasonal PF 04/13/2021, 04/04/2022   Influenza-Unspecified 03/27/2000, 03/27/2001, 04/27/2001, 03/27/2002, 03/28/2003, 03/27/2004, 03/27/2005, 04/27/2006, 04/19/2007, 04/09/2008, 03/16/2009, 04/07/2010, 04/12/2011, 04/14/2023   Moderna Sars-Covid-2 Vaccination 07/17/2019, 08/14/2019, 09/11/2019, 06/30/2020, 03/19/2021   PNEUMOCOCCAL CONJUGATE-20 06/27/2021   PPD Test 06/02/2020   Pneumococcal Conjugate,unspecified 06/27/2013   Pneumococcal Conjugate-13 03/27/2001   Pneumococcal-Unspecified 04/28/1999, 03/27/2001, 04/11/2011   Zoster, Live 03/27/2012   Pertinent  Health Maintenance Due  Topic Date Due   OPHTHALMOLOGY EXAM  Never done   INFLUENZA VACCINE  04/25/2024 (Originally 01/26/2024)   HEMOGLOBIN A1C  05/08/2024   FOOT EXAM  01/07/2025      07/03/2020    9:00 AM 07/03/2020    9:05 PM 01/14/2021    9:22 PM 07/22/2021    4:39 PM 11/23/2023    3:00 PM  Fall Risk  Falls in the past year?     1  Was there an injury with Fall?     0  Fall Risk Category Calculator     1  (RETIRED) Patient Fall Risk Level High fall risk  High fall risk  Low fall risk  Moderate fall risk    Patient at Risk for Falls Due to     History of fall(s);Impaired balance/gait;Impaired mobility  Fall risk Follow up     Falls evaluation completed     Data saved with a previous flowsheet row definition   Functional  Status Survey:    Vitals:   01/29/24 1100  BP: 127/62  Pulse: 73  SpO2: 93%  Weight: 206 lb (93.4 kg)  Height: 5' 11 (1.803 m)   Body mass index is 28.73 kg/m. Physical Exam  Labs reviewed: Recent Labs    06/15/23 0626 06/16/23 0532 06/26/23 0000 07/29/23 0705 11/06/23 0000  NA 132* 135 134* 137 134*  K 3.1* 3.7 4.2 4.0 4.3  CL 96* 99 97* 99 97*  CO2 24 28 29* 29 31*  GLUCOSE 163* 122*  --  127*  --   BUN 13 15 16 12   --   CREATININE 0.98 0.72 0.8 0.79  --   CALCIUM  8.5* 8.3* 8.8 8.6* 8.5*  MG 1.8  --   --   --   --    Recent Labs    06/11/23 2202 11/06/23 0000  AST 19 14  ALT 13 10  ALKPHOS 76 80  BILITOT 0.9  --   PROT 7.3  --   ALBUMIN 3.8 3.3*   Recent Labs    06/15/23 0626 06/16/23 0532 06/26/23 0000 07/29/23 0705 11/06/23 0000  WBC 11.8* 7.7 8.1 5.2 6.9  NEUTROABS  --   --  5,540.00 2.9 4,257.00  HGB 13.6 11.6* 13.0* 12.9* 11.5*  HCT 40.0 34.0* 39* 39.4 35*  MCV 89.3 88.8  --  91.2  --   PLT 263 258 352 238 272   Lab Results  Component Value Date   TSH 1.567 10/07/2016   Lab Results  Component Value Date   HGBA1C 7.1 11/06/2023   Lab Results  Component Value Date   CHOL 151 12/01/2022   HDL  30 (A) 12/01/2022   LDLCALC 88 12/01/2022   TRIG 254 (A) 12/01/2022    Significant Diagnostic Results in last 30 days:  No results found.  Assessment/Plan   Type 2 diabetes mellitus Manages diabetes with dietary modifications, including high protein intake and the use of cinnamon  with chromium  picolinate. Reports good healing and is due for an A1c test. - Order A1c test  Lewy body disease with parkinsonism features and recurrent falls Reports balance issues and falls, describing a sensation of being thrown down. History of hallucinations and diagnosed with Lewy body dementia by a neurologist. EEG and x-ray confirmed diagnosis. Has not yet developed dementia but is aware of potential progression. - Refer to physical therapy for balance and  coordination training  Chronic venous insufficiency with lower extremity edema Significant reduction in pitting edema in legs over the past week. Takes spironolactone  and has stopped aspirin  due to bruising. Experiences urinary spray due to scarring from previous bladder cancer treatment. - Continue spironolactone   First degree atrioventricular block Monitored condition with reports of low blood pressure and pulse, consistent with heart block.  Iron  deficiency, status post IV iron  therapy Received IV iron  therapy and reports feeling well. Maintains a diet with adequate protein intake. - Monitor iron  levels  Depression Reports feeling down, possibly due to concern for his friend with a bladder tumor. Acknowledges mood fluctuations.  Sarcopenia Aware of age-related muscle loss and manages it with a high-protein diet, including yogurt and eggs. Conscious of maintaining muscle mass and strength. - Encourage continued high-protein diet and physical activity  History of bladder cancer with urinary outlet scarring Treated with cauterization and intravesical methotrexate. Experiences urinary spray due to scarring.  History of cholecystectomy with incisional hernia Incisional hernia from previous open cholecystectomy due to gallstones and adhesions.  Age-related skin fragility Experiences skin fragility with easy bruising and peeling. Uses a moisturizer and Vaseline for skin care. Stopped aspirin  due to bruising concerns. - Continue skin care regimen - Avoid aspirin  unless necessary  Family/ staff Communication: nursing   Labs/tests ordered:  CBC, CMP, TSH, A1c, B12, Vit D.

## 2024-01-30 ENCOUNTER — Encounter: Payer: Self-pay | Admitting: Student

## 2024-02-26 DEATH — deceased
